# Patient Record
Sex: Male | Born: 1940 | Race: White | Hispanic: No | Smoking: Never smoker
Health system: Southern US, Community
[De-identification: ages and names within clinical notes are randomized; demographics above are authoritative.]

## PROBLEM LIST (undated history)

## (undated) DIAGNOSIS — N529 Male erectile dysfunction, unspecified: Secondary | ICD-10-CM

## (undated) DIAGNOSIS — I10 Essential (primary) hypertension: Secondary | ICD-10-CM

## (undated) DIAGNOSIS — J3489 Other specified disorders of nose and nasal sinuses: Secondary | ICD-10-CM

## (undated) DIAGNOSIS — N401 Enlarged prostate with lower urinary tract symptoms: Secondary | ICD-10-CM

## (undated) DIAGNOSIS — Z8669 Personal history of other diseases of the nervous system and sense organs: Secondary | ICD-10-CM

## (undated) DIAGNOSIS — E119 Type 2 diabetes mellitus without complications: Secondary | ICD-10-CM

## (undated) DIAGNOSIS — E785 Hyperlipidemia, unspecified: Secondary | ICD-10-CM

## (undated) DIAGNOSIS — K7581 Nonalcoholic steatohepatitis (NASH): Secondary | ICD-10-CM

## (undated) DIAGNOSIS — N183 Chronic kidney disease, stage 3 unspecified: Secondary | ICD-10-CM

## (undated) DIAGNOSIS — Z9289 Personal history of other medical treatment: Secondary | ICD-10-CM

## (undated) DIAGNOSIS — C61 Malignant neoplasm of prostate: Secondary | ICD-10-CM

## (undated) DIAGNOSIS — K7689 Other specified diseases of liver: Secondary | ICD-10-CM

## (undated) HISTORY — DX: Hyperlipidemia, unspecified: E78.5

## (undated) HISTORY — PX: CARPAL TUNNEL RELEASE: SHX101

## (undated) HISTORY — DX: Type 2 diabetes mellitus without complications: E11.9

## (undated) HISTORY — DX: Other specified diseases of liver: K76.89

## (undated) HISTORY — PX: HERNIA REPAIR: SHX51

## (undated) HISTORY — PX: OTHER SURGICAL HISTORY: SHX169

## (undated) HISTORY — PX: TONSILLECTOMY: SUR1361

## (undated) HISTORY — DX: Essential (primary) hypertension: I10

---
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
## 2002-01-14 ENCOUNTER — Encounter: Payer: Self-pay | Admitting: Emergency Medicine

## 2002-01-14 ENCOUNTER — Inpatient Hospital Stay (HOSPITAL_COMMUNITY): Admission: EM | Admit: 2002-01-14 | Discharge: 2002-01-18 | Payer: Self-pay | Admitting: Emergency Medicine

## 2002-01-15 ENCOUNTER — Encounter: Payer: Self-pay | Admitting: Internal Medicine

## 2002-01-16 ENCOUNTER — Encounter: Payer: Self-pay | Admitting: Internal Medicine

## 2002-01-17 ENCOUNTER — Encounter: Payer: Self-pay | Admitting: Internal Medicine

## 2004-03-12 ENCOUNTER — Emergency Department (HOSPITAL_COMMUNITY): Admission: EM | Admit: 2004-03-12 | Discharge: 2004-03-12 | Payer: Self-pay | Admitting: Family Medicine

## 2004-08-23 HISTORY — PX: OTHER SURGICAL HISTORY: SHX169

## 2004-09-14 ENCOUNTER — Ambulatory Visit: Payer: Self-pay | Admitting: Endocrinology

## 2004-11-23 ENCOUNTER — Emergency Department (HOSPITAL_COMMUNITY): Admission: EM | Admit: 2004-11-23 | Discharge: 2004-11-23 | Payer: Self-pay | Admitting: Family Medicine

## 2004-12-06 ENCOUNTER — Ambulatory Visit: Payer: Self-pay | Admitting: Internal Medicine

## 2005-05-30 ENCOUNTER — Ambulatory Visit: Payer: Self-pay | Admitting: Endocrinology

## 2005-07-27 ENCOUNTER — Ambulatory Visit: Payer: Self-pay | Admitting: Endocrinology

## 2005-08-08 ENCOUNTER — Ambulatory Visit: Payer: Self-pay | Admitting: Endocrinology

## 2006-08-28 ENCOUNTER — Ambulatory Visit: Payer: Self-pay | Admitting: Endocrinology

## 2006-08-28 LAB — CONVERTED CEMR LAB
ALT: 30 units/L (ref 0–40)
AST: 26 units/L (ref 0–37)
Albumin: 3.8 g/dL (ref 3.5–5.2)
Alkaline Phosphatase: 102 units/L (ref 39–117)
BUN: 13 mg/dL (ref 6–23)
Basophils Absolute: 0 10*3/uL (ref 0.0–0.1)
Basophils Relative: 0.2 % (ref 0.0–1.0)
Bilirubin, Direct: 0.2 mg/dL (ref 0.0–0.3)
CO2: 28 meq/L (ref 19–32)
Calcium: 9 mg/dL (ref 8.4–10.5)
Chloride: 103 meq/L (ref 96–112)
Chol/HDL Ratio, serum: 2.9
Cholesterol: 117 mg/dL (ref 0–200)
Creatinine, Ser: 1.1 mg/dL (ref 0.4–1.5)
Creatinine,U: 160.9 mg/dL
Eosinophil percent: 2.1 % (ref 0.0–5.0)
GFR calc non Af Amer: 72 mL/min
Glomerular Filtration Rate, Af Am: 87 mL/min/{1.73_m2}
Glucose, Bld: 126 mg/dL — ABNORMAL HIGH (ref 70–99)
HCT: 46 % (ref 39.0–52.0)
HDL: 39.8 mg/dL (ref >39.0)
Hemoglobin: 15.4 g/dL (ref 13.0–17.0)
Hgb A1c MFr Bld: 6.4 % — ABNORMAL HIGH (ref 4.6–6.0)
LDL Cholesterol: 64 mg/dL (ref 0–99)
Lymphocytes Relative: 18.9 % (ref 12.0–46.0)
MCHC: 33.6 g/dL (ref 30.0–36.0)
MCV: 93.5 fL (ref 78.0–100.0)
Microalb Creat Ratio: 1.9 mg/g (ref 0.0–30.0)
Microalb, Ur: 0.3 mg/dL (ref 0.0–1.9)
Monocytes Absolute: 0.6 10*3/uL (ref 0.2–0.7)
Monocytes Relative: 8.3 % (ref 3.0–11.0)
Neutro Abs: 5.4 10*3/uL (ref 1.4–7.7)
Neutrophils Relative %: 70.5 % (ref 43.0–77.0)
PSA: 3.98 ng/mL (ref 0.10–4.00)
Platelets: 272 10*3/uL (ref 150–400)
Potassium: 4 meq/L (ref 3.5–5.1)
RBC: 4.92 M/uL (ref 4.22–5.81)
RDW: 12.3 % (ref 11.5–14.6)
Sodium: 137 meq/L (ref 135–145)
TSH: 0.6 microintl units/mL (ref 0.35–5.50)
Total Bilirubin: 0.8 mg/dL (ref 0.3–1.2)
Total Protein: 6.9 g/dL (ref 6.0–8.3)
Triglyceride fasting, serum: 66 mg/dL (ref 0–149)
VLDL: 13 mg/dL (ref 0–40)
WBC: 7.6 10*3/uL (ref 4.5–10.5)

## 2007-07-09 ENCOUNTER — Encounter: Payer: Self-pay | Admitting: Endocrinology

## 2007-07-09 DIAGNOSIS — E1169 Type 2 diabetes mellitus with other specified complication: Secondary | ICD-10-CM | POA: Insufficient documentation

## 2007-07-09 DIAGNOSIS — I10 Essential (primary) hypertension: Secondary | ICD-10-CM

## 2007-07-09 DIAGNOSIS — E1159 Type 2 diabetes mellitus with other circulatory complications: Secondary | ICD-10-CM | POA: Insufficient documentation

## 2007-07-09 DIAGNOSIS — I152 Hypertension secondary to endocrine disorders: Secondary | ICD-10-CM | POA: Insufficient documentation

## 2007-07-09 DIAGNOSIS — E119 Type 2 diabetes mellitus without complications: Secondary | ICD-10-CM | POA: Insufficient documentation

## 2007-07-09 DIAGNOSIS — E785 Hyperlipidemia, unspecified: Secondary | ICD-10-CM

## 2007-11-12 ENCOUNTER — Ambulatory Visit: Payer: Self-pay | Admitting: Endocrinology

## 2007-11-12 DIAGNOSIS — R609 Edema, unspecified: Secondary | ICD-10-CM | POA: Insufficient documentation

## 2007-11-22 ENCOUNTER — Ambulatory Visit: Payer: Self-pay | Admitting: Internal Medicine

## 2008-11-05 DIAGNOSIS — K7581 Nonalcoholic steatohepatitis (NASH): Secondary | ICD-10-CM

## 2008-11-05 HISTORY — DX: Nonalcoholic steatohepatitis (NASH): K75.81

## 2008-11-09 ENCOUNTER — Telehealth: Payer: Self-pay | Admitting: Endocrinology

## 2008-11-15 ENCOUNTER — Ambulatory Visit: Payer: Self-pay | Admitting: Endocrinology

## 2008-11-15 DIAGNOSIS — R972 Elevated prostate specific antigen [PSA]: Secondary | ICD-10-CM | POA: Insufficient documentation

## 2008-11-15 DIAGNOSIS — M25569 Pain in unspecified knee: Secondary | ICD-10-CM | POA: Insufficient documentation

## 2008-11-18 LAB — CONVERTED CEMR LAB
ALT: 92 units/L — ABNORMAL HIGH (ref 0–53)
AST: 53 units/L — ABNORMAL HIGH (ref 0–37)
Albumin: 4 g/dL (ref 3.5–5.2)
Alkaline Phosphatase: 83 units/L (ref 39–117)
BUN: 13 mg/dL (ref 6–23)
Bacteria, UA: NEGATIVE
Basophils Absolute: 0 10*3/uL (ref 0.0–0.1)
Basophils Relative: 0.6 % (ref 0.0–3.0)
Bilirubin Urine: NEGATIVE
Bilirubin, Direct: 0.1 mg/dL (ref 0.0–0.3)
CO2: 30 meq/L (ref 19–32)
Calcium: 9.4 mg/dL (ref 8.4–10.5)
Chloride: 101 meq/L (ref 96–112)
Cholesterol: 139 mg/dL (ref 0–200)
Creatinine, Ser: 1 mg/dL (ref 0.4–1.5)
Creatinine,U: 152.3 mg/dL
Crystals: NEGATIVE
Eosinophils Absolute: 0.2 10*3/uL (ref 0.0–0.7)
Eosinophils Relative: 2.3 % (ref 0.0–5.0)
GFR calc Af Amer: 96 mL/min
GFR calc non Af Amer: 79 mL/min
Glucose, Bld: 97 mg/dL (ref 70–99)
HCT: 46.6 % (ref 39.0–52.0)
HDL: 47.2 mg/dL (ref >39.0)
Hemoglobin, Urine: NEGATIVE
Hemoglobin: 16 g/dL (ref 13.0–17.0)
Hgb A1c MFr Bld: 6.7 % — ABNORMAL HIGH (ref 4.6–6.0)
Ketones, ur: NEGATIVE mg/dL
LDL Cholesterol: 74 mg/dL (ref 0–99)
Lymphocytes Relative: 24 % (ref 12.0–46.0)
MCHC: 34.3 g/dL (ref 30.0–36.0)
MCV: 93.2 fL (ref 78.0–100.0)
Microalb Creat Ratio: 2.6 mg/g (ref 0.0–30.0)
Microalb, Ur: 0.4 mg/dL (ref 0.0–1.9)
Monocytes Absolute: 0.7 10*3/uL (ref 0.1–1.0)
Monocytes Relative: 9.1 % (ref 3.0–12.0)
Neutro Abs: 5 10*3/uL (ref 1.4–7.7)
Neutrophils Relative %: 64 % (ref 43.0–77.0)
Nitrite: NEGATIVE
PSA: 4.68 ng/mL — ABNORMAL HIGH (ref 0.10–4.00)
Platelets: 231 10*3/uL (ref 150–400)
Potassium: 4 meq/L (ref 3.5–5.1)
RBC: 5 M/uL (ref 4.22–5.81)
RDW: 12.7 % (ref 11.5–14.6)
Sed Rate: 8 mm/hr (ref 0–16)
Sodium: 138 meq/L (ref 135–145)
Specific Gravity, Urine: 1.025 (ref 1.000–1.03)
TSH: 1.04 microintl units/mL (ref 0.35–5.50)
Total Bilirubin: 0.8 mg/dL (ref 0.3–1.2)
Total CHOL/HDL Ratio: 2.9
Total Protein, Urine: NEGATIVE mg/dL
Total Protein: 6.9 g/dL (ref 6.0–8.3)
Triglycerides: 91 mg/dL (ref 0–149)
Uric Acid, Serum: 6.5 mg/dL (ref 4.0–7.8)
Urine Glucose: NEGATIVE mg/dL
Urobilinogen, UA: 0.2 (ref 0.0–1.0)
VLDL: 18 mg/dL (ref 0–40)
WBC: 7.8 10*3/uL (ref 4.5–10.5)
pH: 5.5 (ref 5.0–8.0)

## 2008-11-24 ENCOUNTER — Ambulatory Visit: Payer: Self-pay | Admitting: Endocrinology

## 2008-11-24 LAB — CONVERTED CEMR LAB: HCV Ab: NEGATIVE

## 2008-11-26 LAB — CONVERTED CEMR LAB
ALT: 77 units/L — ABNORMAL HIGH (ref 0–53)
AST: 48 units/L — ABNORMAL HIGH (ref 0–37)
Albumin: 3.6 g/dL (ref 3.5–5.2)
Alkaline Phosphatase: 85 units/L (ref 39–117)
Bilirubin, Direct: 0.2 mg/dL (ref 0.0–0.3)
Total Bilirubin: 1.1 mg/dL (ref 0.3–1.2)
Total Protein: 6.5 g/dL (ref 6.0–8.3)

## 2008-12-08 ENCOUNTER — Telehealth: Payer: Self-pay | Admitting: Endocrinology

## 2009-01-10 ENCOUNTER — Ambulatory Visit: Payer: Self-pay | Admitting: Endocrinology

## 2009-01-12 DIAGNOSIS — K7581 Nonalcoholic steatohepatitis (NASH): Secondary | ICD-10-CM | POA: Insufficient documentation

## 2009-01-12 LAB — CONVERTED CEMR LAB
ALT: 71 units/L — ABNORMAL HIGH (ref 0–53)
AST: 36 units/L (ref 0–37)
Albumin: 3.5 g/dL (ref 3.5–5.2)
Alkaline Phosphatase: 83 units/L (ref 39–117)

## 2009-12-05 ENCOUNTER — Telehealth (INDEPENDENT_AMBULATORY_CARE_PROVIDER_SITE_OTHER): Payer: Self-pay | Admitting: *Deleted

## 2009-12-05 ENCOUNTER — Ambulatory Visit: Payer: Self-pay | Admitting: Endocrinology

## 2009-12-12 ENCOUNTER — Telehealth: Payer: Self-pay | Admitting: Endocrinology

## 2009-12-12 ENCOUNTER — Ambulatory Visit: Payer: Self-pay | Admitting: Endocrinology

## 2010-03-21 ENCOUNTER — Ambulatory Visit: Payer: Self-pay | Admitting: Endocrinology

## 2010-03-21 DIAGNOSIS — N4889 Other specified disorders of penis: Secondary | ICD-10-CM | POA: Insufficient documentation

## 2010-03-21 LAB — CONVERTED CEMR LAB
AST: 24 units/L (ref 0–37)
Hemoglobin, Urine: NEGATIVE
Nitrite: NEGATIVE
Total Protein, Urine: NEGATIVE mg/dL
Urine Glucose: NEGATIVE mg/dL
pH: 5 (ref 5.0–8.0)

## 2010-12-03 LAB — CONVERTED CEMR LAB
ALT: 102 units/L — ABNORMAL HIGH (ref 0–53)
ALT: 32 units/L (ref 0–53)
AST: 23 units/L (ref 0–37)
AST: 58 units/L — ABNORMAL HIGH (ref 0–37)
Albumin: 3.7 g/dL (ref 3.5–5.2)
Albumin: 3.9 g/dL (ref 3.5–5.2)
Alkaline Phosphatase: 93 units/L (ref 39–117)
BUN: 16 mg/dL (ref 6–23)
Bacteria, UA: NEGATIVE
Basophils Absolute: 0 10*3/uL (ref 0.0–0.1)
Basophils Relative: 0.2 % (ref 0.0–1.0)
Basophils Relative: 0.6 % (ref 0.0–3.0)
Bilirubin Urine: NEGATIVE
Bilirubin, Direct: 0.2 mg/dL (ref 0.0–0.3)
CO2: 30 meq/L (ref 19–32)
Calcium: 9.1 mg/dL (ref 8.4–10.5)
Chloride: 102 meq/L (ref 96–112)
Chloride: 103 meq/L (ref 96–112)
Cholesterol: 112 mg/dL (ref 0–200)
Cholesterol: 122 mg/dL (ref 0–200)
Creatinine, Ser: 1.1 mg/dL (ref 0.4–1.5)
Crystals: NEGATIVE
Eosinophils Absolute: 0.1 10*3/uL (ref 0.0–0.6)
Eosinophils Relative: 1.6 % (ref 0.0–5.0)
Eosinophils Relative: 5 % (ref 0.0–5.0)
GFR calc Af Amer: 86 mL/min
GFR calc non Af Amer: 64.06 mL/min (ref >60)
GFR calc non Af Amer: 71 mL/min
Glucose, Bld: 114 mg/dL — ABNORMAL HIGH (ref 70–99)
HCT: 45.6 % (ref 39.0–52.0)
HCT: 46.7 % (ref 39.0–52.0)
HDL: 47.3 mg/dL (ref >39.0)
Hemoglobin: 15.4 g/dL (ref 13.0–17.0)
Hemoglobin: 16 g/dL (ref 13.0–17.0)
Hgb A1c MFr Bld: 6.3 % — ABNORMAL HIGH (ref 4.6–6.0)
Ketones, ur: NEGATIVE mg/dL
LDL Cholesterol: 57 mg/dL (ref 0–99)
LDL Cholesterol: 62 mg/dL (ref 0–99)
Leukocytes, UA: NEGATIVE
Lymphocytes Relative: 20.1 % (ref 12.0–46.0)
Lymphs Abs: 1.7 10*3/uL (ref 0.7–4.0)
MCHC: 34.2 g/dL (ref 30.0–36.0)
MCV: 92.6 fL (ref 78.0–100.0)
MCV: 94.5 fL (ref 78.0–100.0)
Microalb Creat Ratio: 3 mg/g (ref 0.0–30.0)
Monocytes Absolute: 0.6 10*3/uL (ref 0.2–0.7)
Monocytes Absolute: 0.8 10*3/uL (ref 0.1–1.0)
Monocytes Relative: 12.1 % — ABNORMAL HIGH (ref 3.0–12.0)
Monocytes Relative: 9.2 % (ref 3.0–11.0)
Mucus, UA: NEGATIVE
Neutro Abs: 4 10*3/uL (ref 1.4–7.7)
Neutro Abs: 4.2 10*3/uL (ref 1.4–7.7)
Neutrophils Relative %: 68.9 % (ref 43.0–77.0)
Nitrite: NEGATIVE
PSA: 2.86 ng/mL (ref 0.10–4.00)
Platelets: 197 10*3/uL (ref 150–400)
Potassium: 3.9 meq/L (ref 3.5–5.1)
Potassium: 4.4 meq/L (ref 3.5–5.1)
RBC: 5.04 M/uL (ref 4.22–5.81)
RDW: 12.4 % (ref 11.5–14.6)
Sodium: 139 meq/L (ref 135–145)
Sodium: 140 meq/L (ref 135–145)
Specific Gravity, Urine: 1.025 (ref 1.000–1.03)
Specific Gravity, Urine: >=1.03 (ref 1.000–1.030)
Squamous Epithelial / HPF: NEGATIVE /lpf
TSH: 0.93 microintl units/mL (ref 0.35–5.50)
TSH: 1.05 microintl units/mL (ref 0.35–5.50)
Total Bilirubin: 1.2 mg/dL (ref 0.3–1.2)
Total CHOL/HDL Ratio: 2.4
Total Protein, Urine: NEGATIVE mg/dL
Total Protein, Urine: NEGATIVE mg/dL
Total Protein: 6.8 g/dL (ref 6.0–8.3)
Total Protein: 6.9 g/dL (ref 6.0–8.3)
Triglycerides: 38 mg/dL (ref 0–149)
Urine Glucose: NEGATIVE mg/dL
Urine Glucose: NEGATIVE mg/dL
Urobilinogen, UA: 0.2 (ref 0.0–1.0)
Urobilinogen, UA: 0.2 (ref 0.0–1.0)
VLDL: 11.2 mg/dL (ref 0.0–40.0)
VLDL: 8 mg/dL (ref 0–40)
WBC: 6.1 10*3/uL (ref 4.5–10.5)
WBC: 6.8 10*3/uL (ref 4.5–10.5)
pH: 5 (ref 5.0–8.0)
pH: 6 (ref 5.0–8.0)

## 2010-12-07 NOTE — Assessment & Plan Note (Signed)
Summary: FU  STC   Vital Signs:  Patient profile:   70 year old male Height:      69 inches (175.26 cm) Weight:      212.13 pounds (96.42 kg) O2 Sat:      97 % on Room air Temp:     97.9 degrees F (36.61 degrees C) oral Pulse rate:   70 / minute BP sitting:   130 / 70  (left arm) Cuff size:   large  Vitals Entered By: Gardenia Phlegm RMA (Mar 21, 2010 8:06 AM)  O2 Flow:  Room air CC: Follow-up visit/ pt states he is no longer taking Glucophage/ CF Is Patient Diabetic? Yes   CC:  Follow-up visit/ pt states he is no longer taking Glucophage/ CF.  History of Present Illness: pt states 1 week of slight pain at the penis--not just in the context of urination.  no associated d/c. pt takes actos but not metformin.  Current Medications (verified): 1)  Adult Aspirin Low Strength 81 Mg  Tbdp (Aspirin) .... Take 1 By Mouth Qd 2)  Vasotec 20 Mg  Tabs (Enalapril Maleate) .... Take 1 By Mouth Two Times A Day 3)  Lipitor 80 Mg  Tabs (Atorvastatin Calcium) .... Take 1 By Mouth Once Daily 4)  Glucophage Xr 500 Mg  Tb24 (Metformin Hcl) .... Take 2 Tabs Each Am 5)  Actos 45 Mg Tabs (Pioglitazone Hcl) .Marland Kitchen.. 1 Qd  Allergies (verified): 1)  ! Mevacor 2)  ! Zocor  Past History:  Past Medical History: Last updated: 07/09/2007 Diabetes mellitus, type II Hyperlipidemia Hypertension  Review of Systems  The patient denies fever and dyspnea on exertion.    Physical Exam  General:  obese.  no distress  Genitalia:  Normal external male genitalia with no urethral discharge.  Extremities:  trace bilat edema Additional Exam:  Total Bilirubin           0.7 mg/dL                   0.3-1.2   Direct Bilirubin          0.1 mg/dL                   0.0-0.3   Alkaline Phosphatase      71 U/L                      39-117   AST                       24 U/L                      0-37   ALT                       31 U/L                      0-53   Total Protein             6.5 g/dL                     6.0-8.3   Albumin                   3.7 g/dL                    3.5-5.2  Hemoglobin A1C            6.2 %                Impression & Recommendations:  Problem # 1:  DIABETES MELLITUS, TYPE II (ICD-250.00) well-controlled  Problem # 2:  FATTY LIVER DISEASE (ICD-571.8) Assessment: Improved  Problem # 3:  PENILE PAIN (EXP-973.31) uncertain etiology  Other Orders: TLB-Hepatic/Liver Function Pnl (80076-HEPATIC) TLB-A1C / Hgb A1C (Glycohemoglobin) (83036-A1C) TLB-Udip w/ Micro (81001-URINE) Est. Patient Level IV (25087)  Patient Instructions: 1)  blood and urine tests today. 2)  tests are being ordered for you today.  a few days after the test(s), please call [phone] to hear your test results. 3)  pending the test results, please continue the same medications for now, except you can stay-off metformin. 4)  return 4 months 5)  call next week if the pain persists, and i'll prescribe medication for you on a trial basis 6)  (update: i left message on phone-tree:  rx as we discussed)

## 2010-12-07 NOTE — Progress Notes (Signed)
Summary: Actos alternative  Phone Note Call from Patient   Summary of Call: Patient came today for BP check, which was good (130/70) and made me aware that he did receive his lab results. He is currently on Metformin, but would like to know if there is an alternative to Actos that you recommend? He is quite nervous about taking Actos like MD mentioned on phone tree due to all the bad reviews it has been getting. Please advise. Initial call taken by: Ernestene Mention,  December 12, 2009 11:00 AM  Follow-up for Phone Call        actos is a very safe medication.  it is avandia that is dangerous--i have never prescribed it. Follow-up by: Donavan Foil MD,  December 12, 2009 12:41 PM  Additional Follow-up for Phone Call Additional follow up Details #1::        Patient notified and would like to know if he should finish the Metformin he has (which is a large amount) or go ahead and begin Actos? If you would like for the patient to begin Actos he will need 90 day prescription.  Please advise. Additional Follow-up by: Ernestene Mention,  December 12, 2009 1:47 PM    Additional Follow-up for Phone Call Additional follow up Details #2::    i sent rx for actos.  it takes a few months to have its full effect, so i would take both until we get back together is 3 months or so. Follow-up by: Donavan Foil MD,  December 12, 2009 3:32 PM  Additional Follow-up for Phone Call Additional follow up Details #3:: Details for Additional Follow-up Action Taken: Patient notified. Additional Follow-up by: Ernestene Mention,  December 12, 2009 3:33 PM  New/Updated Medications: ACTOS 45 MG TABS (PIOGLITAZONE HCL) 1 qd Prescriptions: ACTOS 45 MG TABS (PIOGLITAZONE HCL) 1 qd  #30 x 11   Entered and Authorized by:   Donavan Foil MD   Signed by:   Donavan Foil MD on 12/12/2009   Method used:   [facility]        [facility]  [address]       [address]       [place],   [address]  Ph: [phone] or [phone]       Fax: [phone]   RxID:   [id number]

## 2010-12-07 NOTE — Assessment & Plan Note (Signed)
Summary: FU ON MEDS /NWS   Vital Signs:  Patient profile:   70 year old male Height:      69 inches (175.26 cm) Weight:      205.13 pounds (93.24 kg) BMI:     30.40 O2 Sat:      95 % on Room air Temp:     97.3 degrees F (36.28 degrees C) oral Pulse rate:   87 / minute BP sitting:   142 / 80  (left arm) Cuff size:   large  Vitals Entered By: Gardenia Phlegm CMA (December 05, 2009 8:03 AM)  O2 Flow:  Room air CC: Follow-up visit/ pt needs refills on meds/ CF Is Patient Diabetic? Yes   CC:  Follow-up visit/ pt needs refills on meds/ CF.  History of Present Illness: here for regular wellness examination.  He's feeling pretty well in general, and does not drink or smoke.   Current Medications (verified): 1)  Adult Aspirin Low Strength 81 Mg  Tbdp (Aspirin) .... Take 1 By Mouth Qd 2)  Vasotec 20 Mg  Tabs (Enalapril Maleate) .... Take 1 By Mouth Two Times A Day Once Daily Physical Is Due No Addtional Refills Until Appt 3)  Lipitor 80 Mg  Tabs (Atorvastatin Calcium) .... Take 1 By Mouth Once Daily 4)  Glucophage Xr 500 Mg  Tb24 (Metformin Hcl) .... Take 2 Q Am Physical Is Due Last Ov 11/11/2007  Allergies (verified): 1)  ! Mevacor 2)  ! Zocor  Past History:  Past Medical History: Last updated: 07/09/2007 Diabetes mellitus, type II Hyperlipidemia Hypertension  Family History: Reviewed history from 11/12/2007 and no changes required. no cancer  Social History: Reviewed history from 11/22/2007 and no changes required. retired married Never Smoked  Review of Systems  The patient denies fever, weight loss, weight gain, vision loss, decreased hearing, chest pain, syncope, prolonged cough, headaches, abdominal pain, melena, hematochezia, severe indigestion/heartburn, hematuria, suspicious skin lesions, and depression.         denies decreased urinary stream  Physical Exam  General:  obese.  no distress  Head:  head: no deformity eyes: no periorbital swelling, no  proptosis external nose and ears are normal mouth: no lesion seen Neck:  Supple without thyroid enlargement or tenderness. No cervical lymphadenopathy, neck masses or tracheal deviation.  Heart:  Regular rate and rhythm without murmurs or gallops noted. Normal S1,S2.   Abdomen:  abdomen is soft, nontender.  no hepatosplenomegaly.   not distended.  no hernia  Rectal:  normal external and internal exam.  heme neg  Prostate:  Normal size prostate without masses or tenderness.  Msk:  muscle bulk and strength are grossly normal.  no obvious joint swelling.  gait is normal and steady  Extremities:  no deformity.  no ulcer on the feet.  feet are of normal color and temp.  there are mild bilateral varicosities. trace right pedal edema and trace left pedal edema.  left great toe (recent nail removal), has no drainage, swelling, or erythema). Neurologic:  cn 2-12 grossly intact.   readily moves all 4's.   sensation is intact to touch on the feet  Skin:  normal texture and temp.  no rash.  not diaphoretic  Cervical Nodes:  No significant adenopathy.  Psych:  Alert and cooperative; normal mood and affect; normal attention span and concentration.   Additional Exam:  SEPARATE EVALUATION FOLLOWS--EACH PROBLEM HERE IS NEW, NOT RESPONDING TO TREATMENT, OR POSES SIGNIFICANT RISK TO THE PATIENT'S HEALTH: HISTORY OF THE PRESENT  ILLNESS: elevated tranaaminases are again noted today he takes vasotec as rx'ed PAST MEDICAL HISTORY reviewed and up to date today REVIEW OF SYSTEMS: denies doe PHYSICAL EXAMINATION: see vs page dorsalis pedis intact bilat.  no carotid bruit clear to auscultation.  no respiratory distress LAB/XRAY RESULTS: AST                  [H]  58 U/L                      0-37 ALT                  [H]  102 U/L   IMPRESSION: nash htn, needs increased rx PLAN: please consider actos bp check < 1 month   Impression & Recommendations:  Problem # 1:  ROUTINE GENERAL MEDICAL EXAM@HEALTH   CARE FACL (ICD-V70.0)  Medications Added to Medication List This Visit: 1)  Vasotec 20 Mg Tabs (Enalapril maleate) .... Take 1 by mouth two times a day 2)  Lipitor 80 Mg Tabs (Atorvastatin calcium) .... Take 1 by mouth once daily 3)  Glucophage Xr 500 Mg Tb24 (Metformin hcl) .... Take 2 tabs each am  Other Orders: EKG w/ Interpretation (93000) TLB-Lipid Panel (80061-LIPID) TLB-BMP (Basic Metabolic Panel-BMET) (70350-KXFGHWE) TLB-CBC Platelet - w/Differential (85025-CBCD) TLB-Hepatic/Liver Function Pnl (80076-HEPATIC) TLB-TSH (Thyroid Stimulating Hormone) (84443-TSH) TLB-A1C / Hgb A1C (Glycohemoglobin) (83036-A1C) TLB-Microalbumin/Creat Ratio, Urine (82043-MALB) TLB-PSA (Prostate Specific Antigen) (84153-PSA) TLB-Udip w/ Micro (81001-URINE) Est. Patient Level III (99371) Est. Patient 65& > (69678)   Patient Instructions: 1)  tests are being ordered for you today.  a few days after the test(s), please call [phone] to hear your test results. 2)  please consider colonoscopy, it is can prevent you from dying from cancer. 3)  blood pressure check here within 1 month 4)  we discussed the recommendations of the preventive services task force Prescriptions: GLUCOPHAGE XR 500 MG  TB24 (METFORMIN HCL) TAKE 2 tabs each am  #60 x 11   Entered and Authorized by:   Donavan Foil MD   Signed by:   Donavan Foil MD on 12/05/2009   Method used:   Electronically to        [id number] (retail)       [place], [place]  [address]       Ph: [phone]       Fax: [phone]   RxID:   [id number] LIPITOR 80 MG  TABS (ATORVASTATIN CALCIUM) take 1 by mouth once daily  #30 x 11   Entered and Authorized by:   Donavan Foil MD   Signed by:   Donavan Foil MD on 12/05/2009   Method used:   Electronically to        [id number] (retail)       [place], [place]  [address]       Ph: [phone]       Fax: [phone]    RxID:   [id number] VASOTEC 20 MG  TABS (ENALAPRIL MALEATE) take 1 by mouth two times a day  #60 x 11   Entered and Authorized by:   Donavan Foil MD   Signed by:   Donavan Foil MD on 12/05/2009   Method used:   Electronically to        [id number]* (retail)  [place], [place]  [address]       Ph: [phone]       Fax: [phone]   RxID:   [id number]

## 2010-12-07 NOTE — Progress Notes (Signed)
Summary: Paper chart  ---- Converted from flag ---- ---- 12/05/2009 8:23 AM, Donavan Foil MD wrote: paper chart please ? myoview ------------------------------  Phone Note Other Incoming   Summary of Call: Paper chart requested Initial call taken by: Gardenia Phlegm CMA,  December 05, 2009 8:29 AM  Follow-up for Phone Call        Chart is on MD's desk Follow-up by: Gardenia Phlegm CMA,  December 05, 2009 9:19 AM     Appended Document: Paper chart 08/22/04- Brantley Fling

## 2010-12-07 NOTE — Assessment & Plan Note (Signed)
Summary: WALK IN, SAE TOLD TO COME FOR BP CHECK PER PT/CD   Nurse Visit   Vital Signs:  Patient profile:   71 year old male BP sitting:   130 / 70  (left arm)  Vitals Entered By: Ernestene Mention (December 12, 2009 10:51 AM) Comments Patient came today for BP check and it was 130/70, he denies any symptoms. Patient was allowed to return home without needing to see MD. Ernestene Mention  December 12, 2009 10:52 AM     Allergies: 1)  ! Mevacor 2)  ! Zocor  Orders Added: 1)  Est. Patient Level I [17356]

## 2011-01-10 ENCOUNTER — Other Ambulatory Visit: Payer: Medicare Other

## 2011-01-10 ENCOUNTER — Other Ambulatory Visit: Payer: Self-pay | Admitting: Endocrinology

## 2011-01-10 ENCOUNTER — Ambulatory Visit (INDEPENDENT_AMBULATORY_CARE_PROVIDER_SITE_OTHER): Payer: Medicare Other | Admitting: Endocrinology

## 2011-01-10 ENCOUNTER — Encounter: Payer: Self-pay | Admitting: Endocrinology

## 2011-01-10 DIAGNOSIS — Z0000 Encounter for general adult medical examination without abnormal findings: Secondary | ICD-10-CM

## 2011-01-10 DIAGNOSIS — E119 Type 2 diabetes mellitus without complications: Secondary | ICD-10-CM

## 2011-01-10 DIAGNOSIS — I10 Essential (primary) hypertension: Secondary | ICD-10-CM

## 2011-01-10 DIAGNOSIS — R05 Cough: Secondary | ICD-10-CM | POA: Insufficient documentation

## 2011-01-10 DIAGNOSIS — R972 Elevated prostate specific antigen [PSA]: Secondary | ICD-10-CM

## 2011-01-10 DIAGNOSIS — R059 Cough, unspecified: Secondary | ICD-10-CM | POA: Insufficient documentation

## 2011-01-10 DIAGNOSIS — E785 Hyperlipidemia, unspecified: Secondary | ICD-10-CM

## 2011-01-10 DIAGNOSIS — K7689 Other specified diseases of liver: Secondary | ICD-10-CM

## 2011-01-10 LAB — URINALYSIS, ROUTINE W REFLEX MICROSCOPIC
Bilirubin Urine: NEGATIVE
Hgb urine dipstick: NEGATIVE
Leukocytes, UA: NEGATIVE
Nitrite: NEGATIVE
pH: 5.5 (ref 5.0–8.0)

## 2011-01-10 LAB — CBC WITH DIFFERENTIAL/PLATELET
Basophils Relative: 0.5 % (ref 0.0–3.0)
Eosinophils Absolute: 0.1 10*3/uL (ref 0.0–0.7)
Hemoglobin: 15 g/dL (ref 13.0–17.0)
Lymphocytes Relative: 24 % (ref 12.0–46.0)
MCHC: 34 g/dL (ref 30.0–36.0)
Monocytes Relative: 10.7 % (ref 3.0–12.0)
Neutro Abs: 4 10*3/uL (ref 1.4–7.7)
Neutrophils Relative %: 62.7 % (ref 43.0–77.0)
RBC: 4.6 Mil/uL (ref 4.22–5.81)
WBC: 6.3 10*3/uL (ref 4.5–10.5)

## 2011-01-10 LAB — LIPID PANEL
Cholesterol: 105 mg/dL (ref 0–200)
HDL: 45.4 mg/dL (ref >39.00)
Total CHOL/HDL Ratio: 2
Triglycerides: 55 mg/dL (ref 0.0–149.0)

## 2011-01-10 LAB — HEPATIC FUNCTION PANEL
AST: 23 U/L (ref 0–37)
Albumin: 3.8 g/dL (ref 3.5–5.2)
Alkaline Phosphatase: 82 U/L (ref 39–117)
Bilirubin, Direct: 0.2 mg/dL (ref 0.0–0.3)
Total Protein: 6.7 g/dL (ref 6.0–8.3)

## 2011-01-10 LAB — BASIC METABOLIC PANEL
BUN: 21 mg/dL (ref 6–23)
CO2: 25 mEq/L (ref 19–32)
Calcium: 9.2 mg/dL (ref 8.4–10.5)
Chloride: 107 mEq/L (ref 96–112)
Creatinine, Ser: 1.2 mg/dL (ref 0.4–1.5)
Glucose, Bld: 104 mg/dL — ABNORMAL HIGH (ref 70–99)

## 2011-01-10 LAB — MICROALBUMIN / CREATININE URINE RATIO
Creatinine,U: 357.4 mg/dL
Microalb, Ur: 1.1 mg/dL (ref 0.0–1.9)

## 2011-01-10 LAB — PSA: PSA: 4.87 ng/mL — ABNORMAL HIGH (ref 0.10–4.00)

## 2011-01-16 NOTE — Assessment & Plan Note (Signed)
Summary: REFILLS ON MEDS /NWS   Vital Signs:  Patient profile:   70 year old male Height:      69 inches (175.26 cm) Weight:      217.50 pounds (98.86 kg) BMI:     32.24 O2 Sat:      96 % on Room air Temp:     98.4 degrees F (36.89 degrees C) oral Pulse rate:   96 / minute Pulse rhythm:   regular BP sitting:   118 / 86  (left arm) Cuff size:   large  Vitals Entered By: Rebeca Alert CMA Deborra Medina) (January 10, 2011 8:04 AM)  O2 Flow:  Room air CC: Follow-up visit/refill on meds/aj Is Patient Diabetic? Yes   CC:  Follow-up visit/refill on meds/aj.  History of Present Illness: pt states few years of slight cough in the throat, but no assoc sob.  he feels there may be some contribution of rhinorrhea as well.  Current Medications (verified): 1)  Adult Aspirin Low Strength 81 Mg  Tbdp (Aspirin) .... Take 1 By Mouth Qd 2)  Vasotec 20 Mg  Tabs (Enalapril Maleate) .... Take 1 By Mouth Two Times A Day 3)  Lipitor 80 Mg  Tabs (Atorvastatin Calcium) .... Take 1 By Mouth Once Daily 4)  Actos 45 Mg Tabs (Pioglitazone Hcl) .Marland Kitchen.. 1 Qd  Allergies (verified): 1)  ! Mevacor 2)  ! Zocor  Past History:  Past Medical History: Last updated: 07/09/2007 Diabetes mellitus, type II Hyperlipidemia Hypertension  Social History: Reviewed history from 11/22/2007 and no changes required. retired married Never Smoked  Review of Systems  The patient denies weight loss and weight gain.    Physical Exam  Head:  head: no deformity eyes: no periorbital swelling, no proptosis external nose and ears are normal mouth: no lesion seen Ears:  TM's intact and clear with normal canals with grossly normal hearing.   Additional Exam:  Hemoglobin A1C            6.5 %    Impression & Recommendations:  Problem # 1:  COUGH DUE TO ACE INHIBITORS (ICD-786.2) Assessment New  Problem # 2:  allergic rhinitis new  Problem # 3:  DIABETES MELLITUS, TYPE II (ICD-250.00) well-controlled  Medications Added  to Medication List This Visit: 1)  Losartan Potassium 100 Mg Tabs (Losartan potassium) .Marland Kitchen.. 1 tab once daily 2)  Flonase 50 Mcg/act Susp (Fluticasone propionate) .... 2 sprays each side once daily  Other Orders: TLB-Lipid Panel (80061-LIPID) TLB-BMP (Basic Metabolic Panel-BMET) (59163-WGYKZLD) TLB-CBC Platelet - w/Differential (85025-CBCD) TLB-Hepatic/Liver Function Pnl (80076-HEPATIC) TLB-TSH (Thyroid Stimulating Hormone) (84443-TSH) TLB-A1C / Hgb A1C (Glycohemoglobin) (83036-A1C) TLB-Microalbumin/Creat Ratio, Urine (82043-MALB) TLB-PSA (Prostate Specific Antigen) (84153-PSA) TLB-Udip w/ Micro (81001-URINE) Est. Patient Level IV (35701)  Patient Instructions: 1)  tests are being ordered for you today.  a few days after the test(s), please call [phone] to hear your test results. 2)  pending the test results, please change enalapril to losartan 100 mg once daily. 3)  take fluticasone nasal spray as below.   4)  please schedule a regular physical soon.   5)  (update: i left message on phone-tree:  rx as we discussed) Prescriptions: FLONASE 50 MCG/ACT SUSP (FLUTICASONE PROPIONATE) 2 sprays each side once daily  #1 device x 11   Entered and Authorized by:   Donavan Foil MD   Signed by:   Donavan Foil MD on 01/10/2011   Method used:   [facility]  [facility]  [address] [facility]retail[facility]       [place], [place]  [address]       Ph: [phone] or [phone]       Fax: [phone]   RxID:   [id number] LOSARTAN POTASSIUM 100 MG TABS (LOSARTAN POTASSIUM) 1 tab once daily  #30 x 11   Entered and Authorized by:   Donavan Foil MD   Signed by:   Donavan Foil MD on 01/10/2011   Method used:   [facility]        [facility]  [address] [id number]       [place], [place]  [address]       Ph: [phone] or [phone]       Fax: [phone]   RxID:   [id number]    Orders Added: 1)  TLB-Lipid Panel  [80061-LIPID] 2)  TLB-BMP (Basic Metabolic Panel-BMET) [50388-EKCMKLK] 3)  TLB-CBC Platelet - w/Differential [85025-CBCD] 4)  TLB-Hepatic/Liver Function Pnl [80076-HEPATIC] 5)  TLB-TSH (Thyroid Stimulating Hormone) [84443-TSH] 6)  TLB-A1C / Hgb A1C (Glycohemoglobin) [83036-A1C] 7)  TLB-Microalbumin/Creat Ratio, Urine [82043-MALB] 8)  TLB-PSA (Prostate Specific Antigen) [84153-PSA] 9)  TLB-Udip w/ Micro [81001-URINE] 10)  Est. Patient Level IV [91791]

## 2011-02-19 ENCOUNTER — Encounter: Payer: Self-pay | Admitting: Endocrinology

## 2011-02-19 ENCOUNTER — Ambulatory Visit (INDEPENDENT_AMBULATORY_CARE_PROVIDER_SITE_OTHER): Payer: Medicare Other | Admitting: Endocrinology

## 2011-02-19 VITALS — BP 120/70 | HR 72 | Temp 97.8°F | Ht 68.0 in | Wt 219.8 lb

## 2011-02-19 DIAGNOSIS — Z0000 Encounter for general adult medical examination without abnormal findings: Secondary | ICD-10-CM

## 2011-02-19 DIAGNOSIS — Z136 Encounter for screening for cardiovascular disorders: Secondary | ICD-10-CM

## 2011-02-19 DIAGNOSIS — E119 Type 2 diabetes mellitus without complications: Secondary | ICD-10-CM

## 2011-02-19 NOTE — Patient Instructions (Addendum)
please consider these measures for your health:  minimize alcohol.  do not use tobacco products.  have a colonoscopy, an this can reduce your risk of dying from cancer.  keep firearms safely stored.  always use seat belts.  have working smoke alarms in your home.  see an eye doctor and dentist regularly.  never drive under the influence of alcohol or drugs (including prescription drugs).  those with fair skin should take precautions against the sun. please let me know what your wishes would be, if artificial life support measures should become necessary.  it is critically important to prevent falling down (keep floor areas well-lit, dry, and free of loose objects). (update:  we discussed code status.  pt requests full code, but would not want to be started or maintained on artificial life-support measures if there was not a reasonable chance of recovery).

## 2011-02-19 NOTE — Progress Notes (Signed)
  Subjective:    Patient ID: [name], male    DOB: 1940/12/20, 70 y.o.   MRN: [id number]  HPI here for regular wellness examination.  He's feeling pretty well in general, and says chronic med probs are stable, except as noted below  Past Medical History  Diagnosis Date  . DIABETES MELLITUS, TYPE II 07/09/2007  . HYPERLIPIDEMIA 07/09/2007  . HYPERTENSION 07/09/2007  . FATTY LIVER DISEASE 01/12/2009   Past Surgical History  Procedure Date  . Stress cardiolite 08/23/2004    reports that he has never smoked. He does not have any smokeless tobacco history on file. His alcohol and drug histories not on file. family history is negative for Cancer. Married.  retired Allergies  Allergen Reactions  . Lovastatin     REACTION: Foot pain  . Simvastatin     REACTION: Myalgias     Review of Systems  Constitutional: Negative for fever.  HENT: Negative for hearing loss.   Eyes: Negative for visual disturbance.  Respiratory: Negative for cough and shortness of breath.   Cardiovascular: Negative for chest pain.  Gastrointestinal: Negative for blood in stool.  Genitourinary: Negative for hematuria.  Musculoskeletal: Negative for back pain.  Skin: Negative for rash.  Neurological: Negative for syncope and headaches.  Hematological: Does not bruise/bleed easily.  Psychiatric/Behavioral: Negative for dysphoric mood. The patient is not nervous/anxious.        Objective:   Physical Exam VS: see vs page GEN: no distress.  Obese. HEAD: head: no deformity eyes: no periorbital swelling, no proptosis external nose and ears are normal mouth: no lesion seen NECK: supple, thyroid is not enlarged CHEST WALL: no deformity BREASTS:  No gynecomastia CV: reg rate and rhythm, no murmur ABD: abdomen is soft, nontender.  no hepatosplenomegaly.  not distended.  no hernia RECTAL: normal external and internal exam.  heme neg. PROSTATE:  Normal size.  No nodule MUSCULOSKELETAL: muscle bulk and strength  are grossly normal.  no obvious joint swelling.  gait is normal and steady EXTEMITIES: no deformity.  no ulcer on the feet.  feet are of normal color and temp.  There is 1+ bilat leg edema, and bilat varicosities. There is onychomycosis of the right great toenail.  The left great toenail is surgically absent PULSES: dorsalis pedis intact bilat.  no carotid bruit NEURO:  cn 2-12 grossly intact.   readily moves all 4's.  sensation is intact to touch on the feet SKIN:  Normal texture and temperature.  No rash or suspicious lesion is visible.   NODES:  None palpable at the neck PSYCH: alert, oriented x3.  Does not appear anxious nor depressed.  Ecg:  No signif change        Assessment & Plan:  Wellness visit, with prob stable

## 2011-03-23 NOTE — Discharge Summary (Signed)
[facility]  Patient:    [name], [name] Visit Number: [id number] MRN: [id number]          Service Type: MED Location: [id number] Attending Physician:  Linna Darner Dictated by:   Jairo Ben, M.D. Admit Date:  01/13/2002 Discharge Date: 01/18/2002   CC:         Dr. Loanne Drilling, [facility], [place]   Discharge Summary  ADDENDUM:  CT scan: Minimal amount of pleural effusions and some minimal atelectasis at the right base most likely secondary to IV hydration.  I expect this to improve given time.  I would recommend chest x-ray for followup, and this can be done by Dr. Loanne Drilling as an outpatient. Dictated by:   Jairo Ben, M.D. Attending Physician:  Linna Darner DD:  01/18/02 TD:  01/19/02 Job: [id number]

## 2011-03-23 NOTE — Discharge Summary (Signed)
[place]. [facility]  Patient:    [name], [name] Visit Number: [id number] MRN: [id number]          Service Type: MED Location: [phone] Attending Physician:  Linna Darner Dictated by:   Jairo Ben, M.D. [facility] Admit Date:  01/13/2002 Discharge Date: 01/18/2002   CC:         Gloris Ham, M.D. [facility]   Discharge Summary  DISCHARGE DIAGNOSES: 1. Mild small bowel ileus thought secondary to use of Lomotil for his viral    gastroenteritis. 2. Nausea, vomiting, and diarrhea thought secondary to viral gastroenteritis    which has resolved. 3. History of dyslipidemia. 4. History of type 2 diabetes. 5. History of hypertension. 6. History of carpal tunnel syndrome. 7. The patient also noted to have gallstone on CT scan of his abdomen without    any gallbladder wall thickening. 8. The patient has an enlarged prostate by CT scan. 9. Mild renal insufficiency thought secondary to volume depletion from    dehydration secondary to nausea, vomiting, and diarrhea.  DISCHARGE MEDICATIONS:  The patient is to restart back on his Lipitor and Vasotec by tomorrow, his Actos will be started back once he gets his sugars running greater than 150 just to be on the safe side.  DISCHARGE FOLLOWUP:  Dr. Renato Shin in 1-2 weeks.  The patient will need a referral to general surgery as an outpatient to address the gallstone if this patient needs to undergo cholecystectomy electively at some point in time.  At this point in time it certainly was not an emergency for him to have an inpatient surgical consult.  DISCHARGE DIET:  The patient recommended to be on a bland diet for the next week or so and to increase his dairy products in a weeks time although he currently is to abstain from it.  DISCHARGE ACTIVITY:  As tolerated.  He should be able to return to work tomorrow.  HOSPITAL COURSE:  The patient was admitted by Dr.  Loanne Drilling from the clinic on January 14, 2002 with complaints of severe nausea, vomiting and diarrhea and with some periumbilical pain and myalgias.  He was on x-ray findings noted to have some mild ileus and was initially thought to have some small-bowel obstruction.  The patient was kept on bowel rest, had an NG tube placed, and started on IV fluids.  The patient continued to do well, his NG was initially clamped and subsequently discontinued on Friday and he has tolerated that very well.  His repeat abdominal films done on Thursday showed improvement in the small bowel dilatation without any signs of obstruction which clearly suggested an improvement.  The patient tolerated clear liquids initially.  A CT of the abdomen and pelvis was done because he was still having some bowel dilatation and that essentially has come back normal except for the fact that he was noted to have gallstone without any gallbladder wall thickening, also noted was an enlarged prostate with some calcification.  This certainly can be addressed as an outpatient also if he has not had a PSA done he can have that done by Dr. Loanne Drilling.  His initial set of laboratory workup showed a total white count of 15.4 with an initial H&H of 19 and 55.  A repeat done on January 17, 2002 showed a white count of 8.9 with an H&H of 13.6 and 38.8, platelet count is 259,000, this was on the blood count done on  January 17, 2002.  The rest of his indices were normal.  His CMP initially showed a sodium of 127, potassium of 4.2, chloride and CO2 were 96 and 19, glucose of 243, BUN and creatinine were 39 and 2.3, total bili of 0.7, rest of the LFTs really were normal.  A repeat BMP done on January 17, 2002 showed his sodium to be 137, potassium of 3.9, chloride and CO2 were 102 and 29, glucose was 130, BUN and creatinine were 11 and 1.0, respectively.  In regards to his diabetes mellitus: Since we had kept him n.p.o. and had just put him on clear  liquids his sugars actually have been running very good without needing sliding scale coverage, we have kept him off of his Actos and he is to restart his Actos as an outpatient.  I recommended to the patient and his wife for him to have his blood sugars checked three times a day and if it is consistently running greater than 150 to go ahead and start his Actos of 15 mg p.o. q.d.  In regards to his blood pressure: Since his diagnosis was dehydration as evidenced by the laboratory numbers, his Enalapril was held and this is continuing to be held since his blood pressure has not been terribly elevated, there have been some periodic elevations but I recommended that he start back on his Vasotec tomorrow morning.  In regards to his gallstone: The patient really has been asymptomatic from this standpoint and it probably is just an incidental finding but probably a good idea to get this addressed as an outpatient with general surgery referral and they can decide whether they will do an elective cholecystectomy and the patient would like to get that taken care of.  DISPOSITION:  The patient is being discharged to home in stable condition. Dictated by:   Jairo Ben, M.D. [place] Attending Physician:  Linna Darner DD:  01/18/02 TD:  01/19/02 Job: [id number]

## 2011-03-23 NOTE — H&P (Signed)
[place]. [facility]  Patient:    [name], [name] Visit Number: [id number] MRN: [id number]          Service Type: MED Location: [phone] Attending Physician:  Linna Darner Dictated by:   Gloris Ham, M.D. [facility] Admit Date:  01/13/2002                           History and Physical  REASON FOR ADMISSION:  Vomiting.  HISTORY OF PRESENT ILLNESS:  The patient is a 70 year old man with four days of severe nausea, vomiting, and diarrhea.  There is associated periumbilical pain and myalgias.  PAST MEDICAL HISTORY: 1. Dyslipidemia. 2. Type 2 diabetes. 3. Carpal tunnel syndrome. 4. Hypertension.  MEDICATIONS:  Chronically are Lipitor, Vasotec, and Actos.  Medications prescribed in the office two days are Phenergan, Lomotil, Levsin, Nexium, and Ambien.  SOCIAL HISTORY:  The patient is married.  His wife is here.  He does not smoke, drink, or do drugs.  FAMILY HISTORY:  No one else at home is ill.  REVIEW OF SYSTEMS:  Denies the following; bright red blood per rectum, melena, headache, loss of consciousness, chest pain, shortness of breath, jaundice, polyuria, fever, dysuria, cough, and dizziness.  PHYSICAL EXAMINATION:  VITAL SIGNS:  Blood pressure 108/60, heart rate 130, respiratory rate 20, and the patient is afebrile.  GENERAL:  No distress.  SKIN:  Not diaphoretic.  HEENT:  Head is atraumatic.  Sclerae nonicteric.  Pharynx is clear.  NECK:  Supple.  CHEST:  Clear to auscultation.  CARDIOVASCULAR:  No JVD, no edema.  Tachycardic, regular rhythm, no murmur.  ABDOMEN:  Soft, but it is distended.  It is obese, and obesity limits the examination.  No hepatosplenomegaly.  No mass.  Bowel sounds are slightly increased in intensity and pitch, but only slightly so.  No hernia present.  RECTAL:  Per the emergency room physician is Hemoccult positive.  EXTREMITIES:  No obvious deformity of the joints.  No ulcer on the  feet.  NEUROLOGIC:  Alert and well-oriented.  Cranial nerves are grossly intact.  The patient moves all fours and sensation is intact to touch in the feet.  LABORATORY STUDIES:  Remarkable for a sodium of 127, BUN 42, glucose 157.  WBC 15,400.  Urinalysis normal.  X-rays of the chest are clear.  Abdomen shows dilated small bowel loops with the possibility of small bowel obstruction.  IMPRESSION: 1. Partial small bowel obstruction. 2. Type 2 diabetes. 3. Hyponatremia.  PLAN: 1. Intravenous fluids. 2. Symptomatic therapy for nausea. 3. PPI therapy. 4. Recheck CBC and BMET in morning. 5. Check LFT and amylase. 6. Hold Lipitor, Vasotec, and Actos. 7. PRN subcutaneous insulin. 8. Full code at the patients request. Dictated by:   Gloris Ham, M.D. [place] Attending Physician:  Linna Darner DD:  01/14/02 TD:  01/14/02 Job: [id number]

## 2011-08-20 ENCOUNTER — Other Ambulatory Visit: Payer: Self-pay | Admitting: Endocrinology

## 2011-12-20 ENCOUNTER — Other Ambulatory Visit (INDEPENDENT_AMBULATORY_CARE_PROVIDER_SITE_OTHER): Payer: Medicare Other

## 2011-12-20 ENCOUNTER — Encounter: Payer: Self-pay | Admitting: Endocrinology

## 2011-12-20 ENCOUNTER — Ambulatory Visit (INDEPENDENT_AMBULATORY_CARE_PROVIDER_SITE_OTHER): Payer: Medicare Other | Admitting: Endocrinology

## 2011-12-20 VITALS — BP 110/58 | HR 73 | Temp 97.8°F | Ht 68.0 in | Wt 223.0 lb

## 2011-12-20 DIAGNOSIS — E119 Type 2 diabetes mellitus without complications: Secondary | ICD-10-CM

## 2011-12-20 NOTE — Patient Instructions (Addendum)
Please come back for a regular physical appointment in 3 months.   blood tests are being requested for you today.  please call [phone] to hear your test results.  You will be prompted to enter the 9-digit "MRN" number that appears at the top left of this page, followed by #.  Then you will hear the message. Based on the results, i will probably reduce the actos.   (update: i left message on phone-tree:  Stop actos.  Call when cbg's go over 150, so i can rx metformin)

## 2011-12-20 NOTE — Progress Notes (Signed)
  Subjective:    Patient ID: [name], male    DOB: 09/17/1941, 71 y.o.   MRN: [id number]  HPI Pt returns for f/u of type 2 DM (2002).  Pt reports few mos of slight swelling of the legs, but no assoc sob.   He takes meds as rx'ed.   Past Medical History  Diagnosis Date  . DIABETES MELLITUS, TYPE II 07/09/2007  . HYPERLIPIDEMIA 07/09/2007  . HYPERTENSION 07/09/2007  . FATTY LIVER DISEASE 01/12/2009    Past Surgical History  Procedure Date  . Stress cardiolite 08/23/2004    History   Social History  . Marital Status: Married    Spouse Name: N/A    Number of Children: N/A  . Years of Education: N/A   Occupational History  . Retired    Social History Main Topics  . Smoking status: Never Smoker   . Smokeless tobacco: Not on file  . Alcohol Use:   . Drug Use:   . Sexually Active:    Other Topics Concern  . Not on file   Social History Narrative  . No narrative on file    Current Outpatient Prescriptions on File Prior to Visit  Medication Sig Dispense Refill  . aspirin 81 MG tablet Take 81 mg by mouth daily.        Marland Kitchen atorvastatin (LIPITOR) 80 MG tablet TAKE 1 TABLET EVERY DAY BY MOUTH  90 tablet  1  . fluticasone (FLONASE) 50 MCG/ACT nasal spray 2 sprays by Nasal route daily.        Marland Kitchen losartan (COZAAR) 100 MG tablet Take 100 mg by mouth daily.          Allergies  Allergen Reactions  . Lovastatin     REACTION: Foot pain  . Simvastatin     REACTION: Myalgias    Family History  Problem Relation Age of Onset  . Cancer Neg Hx     BP 110/58  Pulse 73  Temp(Src) 97.8 F (36.6 C) (Oral)  Ht 5' 8"  (1.727 m)  Wt 223 lb (101.152 kg)  BMI 33.91 kg/m2  SpO2 97%    Review of Systems Denies weight change and chest pain.    Objective:   Physical Exam VITAL SIGNS:  See vs page GENERAL: no distress Pulses: dorsalis pedis intact bilat.   Feet: no deformity.  no ulcer on the feet.  feet are of normal color and temp.  1+ bilat leg edema.  There is bilateral  onychomycosis.  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   Neuro: sensation is intact to touch on the feet.     Lab Results  Component Value Date   HGBA1C 6.4 12/20/2011      Assessment & Plan:  Dm, well-controlled Edema, due to actos, new

## 2011-12-25 ENCOUNTER — Other Ambulatory Visit: Payer: Self-pay | Admitting: *Deleted

## 2011-12-25 MED ORDER — LOSARTAN POTASSIUM 100 MG PO TABS: 100.0000 mg | ORAL_TABLET | Freq: Every day | ORAL | Status: DC

## 2011-12-25 MED ORDER — ATORVASTATIN CALCIUM 80 MG PO TABS: ORAL_TABLET | ORAL | Status: DC

## 2011-12-25 MED ORDER — FLUTICASONE PROPIONATE 50 MCG/ACT NA SUSP: 2.0000 | Freq: Every day | NASAL | Status: DC

## 2011-12-25 NOTE — Telephone Encounter (Signed)
Pt is requesting refill of Metformin as dicussed at last OV . Pt also wants rx's printed for year's supply for Lipitor, Flonase, Losartan to take to new pharmacy as well as Metformin.

## 2011-12-25 NOTE — Telephone Encounter (Signed)
Please advise on Metformin Rx.

## 2012-02-10 ENCOUNTER — Ambulatory Visit (INDEPENDENT_AMBULATORY_CARE_PROVIDER_SITE_OTHER): Payer: Medicare Other | Admitting: Family Medicine

## 2012-02-10 VITALS — BP 149/75 | HR 109 | Temp 100.0°F | Resp 18 | Ht 67.0 in | Wt 216.0 lb

## 2012-02-10 DIAGNOSIS — J039 Acute tonsillitis, unspecified: Secondary | ICD-10-CM

## 2012-02-10 DIAGNOSIS — H6690 Otitis media, unspecified, unspecified ear: Secondary | ICD-10-CM

## 2012-02-10 DIAGNOSIS — J029 Acute pharyngitis, unspecified: Secondary | ICD-10-CM

## 2012-02-10 DIAGNOSIS — H65199 Other acute nonsuppurative otitis media, unspecified ear: Secondary | ICD-10-CM

## 2012-02-10 NOTE — Progress Notes (Signed)
This is a 71 year old gentleman, retired, comes in with 3 days of sore throat and left ear pain. He's having a fever as well. He's had a mild cough which is nonproductive. He has some diffuse myalgias which are worsening.  Objective: Alert and cooperative and in no acute distress  Left TM red  Oropharynx red without exudate   neck: Supple no adenopathy  Chest: Clear, occasional cough in the room  Heart: Regular no murmur  Assessment: Left otitis media  Plan: Augmentin 875 twice a day x7 days, Hydromet 4 ounces-5 mL

## 2012-08-07 ENCOUNTER — Telehealth: Payer: Self-pay

## 2012-08-08 ENCOUNTER — Telehealth: Payer: Self-pay

## 2012-08-08 NOTE — Telephone Encounter (Signed)
LMOVM for pt to rtn call

## 2012-08-12 NOTE — Telephone Encounter (Signed)
Already addressed in a different phone note.

## 2012-08-12 NOTE — Telephone Encounter (Signed)
Pt advised per dr. Loanne Drilling after reviewing pt's chart he can can keep f/u appt as scheduled

## 2013-01-06 ENCOUNTER — Ambulatory Visit (INDEPENDENT_AMBULATORY_CARE_PROVIDER_SITE_OTHER): Payer: Medicare Other | Admitting: Endocrinology

## 2013-01-06 ENCOUNTER — Encounter: Payer: Self-pay | Admitting: Endocrinology

## 2013-01-06 ENCOUNTER — Other Ambulatory Visit: Payer: Self-pay | Admitting: *Deleted

## 2013-01-06 ENCOUNTER — Other Ambulatory Visit: Payer: Self-pay | Admitting: Endocrinology

## 2013-01-06 VITALS — BP 124/80 | HR 78 | Wt 211.0 lb

## 2013-01-06 DIAGNOSIS — Z0000 Encounter for general adult medical examination without abnormal findings: Secondary | ICD-10-CM

## 2013-01-06 DIAGNOSIS — G610 Guillain-Barre syndrome: Secondary | ICD-10-CM | POA: Insufficient documentation

## 2013-01-06 DIAGNOSIS — R972 Elevated prostate specific antigen [PSA]: Secondary | ICD-10-CM

## 2013-01-06 DIAGNOSIS — K7689 Other specified diseases of liver: Secondary | ICD-10-CM

## 2013-01-06 DIAGNOSIS — E119 Type 2 diabetes mellitus without complications: Secondary | ICD-10-CM

## 2013-01-06 DIAGNOSIS — E785 Hyperlipidemia, unspecified: Secondary | ICD-10-CM

## 2013-01-06 DIAGNOSIS — I10 Essential (primary) hypertension: Secondary | ICD-10-CM

## 2013-01-06 LAB — URINALYSIS, ROUTINE W REFLEX MICROSCOPIC
Leukocytes, UA: NEGATIVE
Nitrite: NEGATIVE
Specific Gravity, Urine: 1.025 (ref 1.000–1.030)
Urobilinogen, UA: 0.2 (ref 0.0–1.0)

## 2013-01-06 LAB — CBC WITH DIFFERENTIAL/PLATELET
Basophils Absolute: 0.1 10*3/uL (ref 0.0–0.1)
Eosinophils Absolute: 0.1 10*3/uL (ref 0.0–0.7)
Lymphocytes Relative: 23.1 % (ref 12.0–46.0)
MCHC: 33.4 g/dL (ref 30.0–36.0)
Neutrophils Relative %: 67.1 % (ref 43.0–77.0)
RBC: 5.01 Mil/uL (ref 4.22–5.81)
RDW: 13.3 % (ref 11.5–14.6)

## 2013-01-06 LAB — BASIC METABOLIC PANEL
BUN: 17 mg/dL (ref 6–23)
CO2: 32 mEq/L (ref 19–32)
Chloride: 104 mEq/L (ref 96–112)
Creatinine, Ser: 1.1 mg/dL (ref 0.4–1.5)
Glucose, Bld: 200 mg/dL — ABNORMAL HIGH (ref 70–99)

## 2013-01-06 LAB — HEPATIC FUNCTION PANEL
ALT: 64 U/L — ABNORMAL HIGH (ref 0–53)
AST: 43 U/L — ABNORMAL HIGH (ref 0–37)
Albumin: 3.8 g/dL (ref 3.5–5.2)
Total Bilirubin: 0.9 mg/dL (ref 0.3–1.2)
Total Protein: 7 g/dL (ref 6.0–8.3)

## 2013-01-06 LAB — LIPID PANEL: VLDL: 23.8 mg/dL (ref 0.0–40.0)

## 2013-01-06 LAB — HEMOGLOBIN A1C: Hgb A1c MFr Bld: 7.2 % — ABNORMAL HIGH (ref 4.6–6.5)

## 2013-01-06 LAB — TSH: TSH: 1 u[IU]/mL (ref 0.35–5.50)

## 2013-01-06 MED ORDER — METFORMIN HCL ER (MOD) 500 MG PO TB24: 1000.0000 mg | ORAL_TABLET | Freq: Every day | ORAL | Status: DC

## 2013-01-06 MED ORDER — ATORVASTATIN CALCIUM 80 MG PO TABS: ORAL_TABLET | ORAL | Status: DC

## 2013-01-06 MED ORDER — LOSARTAN POTASSIUM 100 MG PO TABS: 100.0000 mg | ORAL_TABLET | Freq: Every day | ORAL | Status: DC

## 2013-01-06 NOTE — Patient Instructions (Addendum)
(  pt is called, and advised to take metformin)

## 2013-01-06 NOTE — Progress Notes (Signed)
  Subjective:    Patient ID: [name], male    DOB: 06-25-1941, 72 y.o.   MRN: [id number]  HPI Pt returns for f/u of type 2 DM.  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  pt states he feels well in general. Past Medical History  Diagnosis Date  . DIABETES MELLITUS, TYPE II 07/09/2007  . HYPERLIPIDEMIA 07/09/2007  . HYPERTENSION 07/09/2007  . FATTY LIVER DISEASE 01/12/2009    Past Surgical History  Procedure Laterality Date  . Stress cardiolite  08/23/2004    History   Social History  . Marital Status: Married    Spouse Name: N/A    Number of Children: N/A  . Years of Education: N/A   Occupational History  . Retired    Social History Main Topics  . Smoking status: Never Smoker   . Smokeless tobacco: Not on file  . Alcohol Use:   . Drug Use:   . Sexually Active:    Other Topics Concern  . Not on file   Social History Narrative  . No narrative on file    Current Outpatient Prescriptions on File Prior to Visit  Medication Sig Dispense Refill  . aspirin 81 MG tablet Take 81 mg by mouth daily.        . fluticasone (FLONASE) 50 MCG/ACT nasal spray Place 2 sprays into the nose daily.  48 g  3   No current facility-administered medications on file prior to visit.    Allergies  Allergen Reactions  . Lovastatin     REACTION: Foot pain  . Simvastatin     REACTION: Myalgias    Family History  Problem Relation Age of Onset  . Cancer Neg Hx     BP 124/80  Pulse 78  Wt 211 lb (95.709 kg)  BMI 33.04 kg/m2  SpO2 96%    Review of Systems He says edema is less, but it persists    Objective:   Physical Exam Pulses: dorsalis pedis intact bilat.   Feet: no deformity.  no ulcer on the feet.  feet are of normal color and temp.  1+ pretib edema.  There is bilateral onychomycosis. Neuro: sensation is intact to touch on the feet (note: please disregard foot exam in cpx visit today--this exam is correct)    Lab Results  Component  Value Date   HGBA1C 7.2* 01/06/2013      Assessment & Plan:  DM, [name] increased rx Edema:  This precludes resumption of the actos

## 2013-01-06 NOTE — Progress Notes (Signed)
  Subjective:    Patient ID: [name], male    DOB: 01/02/1941, 72 y.o.   MRN: [id number]  HPI here for regular wellness examination.  He's feeling pretty well in general, and says chronic med probs are stable, except as noted below    Review of Systems  Constitutional: Negative for fever and unexpected weight change.  HENT: Negative for hearing loss.   Eyes: Negative for visual disturbance.  Respiratory: Negative for shortness of breath.   Cardiovascular: Negative for chest pain.  Gastrointestinal: Negative for anal bleeding.  Endocrine: Negative for polyuria.  Genitourinary: Negative for hematuria and difficulty urinating.  Musculoskeletal: Negative for back pain.  Skin: Negative for rash.  Allergic/Immunologic: Negative for environmental allergies.  Neurological: Negative for syncope and numbness.  Hematological: Does not bruise/bleed easily.  Psychiatric/Behavioral: Negative for decreased concentration.       Objective:   Physical Exam VS: see vs page GEN: no distress HEAD: head: no deformity eyes: no periorbital swelling, no proptosis external nose and ears are normal mouth: no lesion seen NECK: supple, thyroid is not enlarged CHEST WALL: no deformity LUNGS: clear to auscultation BREASTS:  No gynecomastia CV: reg rate and rhythm, no murmur ABD: abdomen is soft, nontender.  no hepatosplenomegaly.  not distended.  no hernia RECTAL: normal external and internal exam.  heme neg. PROSTATE:  Normal size.  No nodule MUSCULOSKELETAL: muscle bulk and strength are grossly normal.  no obvious joint swelling.  gait is normal and steady EXTEMITIES: no deformity.  no ulcer on the feet.  feet are of normal color and temp.  no edema PULSES: dorsalis pedis intact bilat.  no carotid bruit NEURO:  cn 2-12 grossly intact.   readily moves all 4's.  sensation is intact to touch on the feet SKIN:  Normal texture and temperature.  No rash or suspicious lesion is visible.   NODES:   None palpable at the neck. PSYCH: alert, oriented x3.  Does not appear anxious nor depressed.   (ecg not done, as the machine does not have paper)    Assessment & Plan:  Wellness visit today, with problems stable, except as noted.  we discussed code status.  pt requests full code, but would not want to be started or maintained on artificial life-support measures if there was not a reasonable chance of recovery

## 2013-01-06 NOTE — Addendum Note (Signed)
Addended by: Renato Shin on: 01/06/2013 06:58 PM   Modules accepted: Level of Service

## 2013-02-20 ENCOUNTER — Ambulatory Visit (INDEPENDENT_AMBULATORY_CARE_PROVIDER_SITE_OTHER): Payer: Medicare Other | Admitting: Family Medicine

## 2013-02-20 VITALS — BP 136/82 | HR 70 | Temp 98.1°F | Resp 16 | Ht 68.0 in | Wt 210.0 lb

## 2013-02-20 DIAGNOSIS — S81009A Unspecified open wound, unspecified knee, initial encounter: Secondary | ICD-10-CM

## 2013-02-20 DIAGNOSIS — J029 Acute pharyngitis, unspecified: Secondary | ICD-10-CM

## 2013-02-20 DIAGNOSIS — J309 Allergic rhinitis, unspecified: Secondary | ICD-10-CM

## 2013-02-20 DIAGNOSIS — S81802A Unspecified open wound, left lower leg, initial encounter: Secondary | ICD-10-CM

## 2013-02-20 DIAGNOSIS — R05 Cough: Secondary | ICD-10-CM

## 2013-02-20 DIAGNOSIS — Z23 Encounter for immunization: Secondary | ICD-10-CM

## 2013-02-20 DIAGNOSIS — R059 Cough, unspecified: Secondary | ICD-10-CM

## 2013-02-20 LAB — POCT RAPID STREP A (OFFICE): Rapid Strep A Screen: NEGATIVE

## 2013-02-20 MED ORDER — BENZONATATE 100 MG PO CAPS: 100.0000 mg | ORAL_CAPSULE | Freq: Three times a day (TID) | ORAL | Status: DC | PRN

## 2013-02-20 MED ORDER — DOXYCYCLINE HYCLATE 100 MG PO TABS: 100.0000 mg | ORAL_TABLET | Freq: Two times a day (BID) | ORAL | Status: DC

## 2013-02-20 MED ORDER — TETANUS-DIPHTHERIA TOXOIDS TD 2-2 LF/0.5ML IM SUSP: 0.5000 mL | Freq: Once | INTRAMUSCULAR | Status: DC

## 2013-02-20 MED ORDER — FLUTICASONE PROPIONATE 50 MCG/ACT NA SUSP: 2.0000 | Freq: Every day | NASAL | Status: DC

## 2013-02-20 NOTE — Patient Instructions (Addendum)
You got a tetanus shot today which is good for 10 years- 8 years if you have a significant wound.  We are going to use the doxycycline medication for your leg wound.  This will also help with any sinus or chest infection.  However, I think your sore throat/ cough/ nasal symptoms are due to allergies.  Use the flonase nasal spray for allergies.  You can also use the tessalon perles for your cough

## 2013-02-20 NOTE — Progress Notes (Signed)
[facility] [address] [place] [address] [phone]  Date:  02/20/2013   Name:  [name]   DOB:  [birth year]   MRN:  [id number]  PCP:  Renato Shin, MD    Chief Complaint: Sore Throat and Wound Check   History of Present Illness:  [name] is a 72 y.o. very pleasant male patient who presents with the following:  He is here today with a ST for a couple of days.  He has noted that his ears are stuffy, he has a slight cough, slight runny nose, sneezing, no itchy eyes.  "If I don't get it fast it will turn into bronchitis."  No fever or chills, no GI symptoms  He scraped his right shin 2 days ago on a picnic table.  He notes some redness, warmth, and soreness around the site of the scrape. He is afraid it is getting infected and that he needs a tetanus shot.    His last tetanus shot was a Td in 2004.  He has a history of DM and sees Dr. Arnoldo Lenis for this.  A1c last month 7.2%. 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  Thought to have been caused by a viral infection, not related to any immunization per his report.  His recovery was nearly 100%  Discussed with pharmD at [facility]- as long as past incidence of Guillain- Josefa Half was not within 6 weeks of a tetanus shot he is ok to receive Td.  Confirmed with pt that his GB was NOT related to any immunization, and he did receive a Td shot 10 years ago wihtout ill effect.  He is ok to receive a Td shot today.    Patient Active Problem List  Diagnosis  . DIABETES MELLITUS, TYPE II  . HYPERLIPIDEMIA  . HYPERTENSION  . FATTY LIVER DISEASE  . PENILE PAIN  . KNEE PAIN, RIGHT  . EDEMA  . PSA, INCREASED  . COUGH DUE TO ACE INHIBITORS  . Routine general medical examination at a health care facility  . Guillain-Barre syndrome    Past Medical History  Diagnosis Date  . DIABETES MELLITUS, TYPE II 07/09/2007  . HYPERLIPIDEMIA 07/09/2007  . HYPERTENSION 07/09/2007  . FATTY LIVER DISEASE 01/12/2009     Past Surgical History  Procedure Laterality Date  . Stress cardiolite  08/23/2004    History  Substance Use Topics  . Smoking status: Never Smoker   . Smokeless tobacco: Not on file  . Alcohol Use: No    Family History  Problem Relation Age of Onset  . Cancer Neg Hx     Allergies  Allergen Reactions  . Lovastatin     REACTION: Foot pain  . Simvastatin     REACTION: Myalgias    Medication list has been reviewed and updated.  Current Outpatient Prescriptions on File Prior to Visit  Medication Sig Dispense Refill  . aspirin 81 MG tablet Take 81 mg by mouth daily.        Marland Kitchen atorvastatin (LIPITOR) 80 MG tablet TAKE 1 TABLET EVERY DAY BY MOUTH  90 tablet  3  . losartan (COZAAR) 100 MG tablet Take 1 tablet (100 mg total) by mouth daily.  90 tablet  3  . metFORMIN (GLUMETZA) 500 MG (MOD) 24 hr tablet Take 2 tablets (1,000 mg total) by mouth daily with breakfast.  180 tablet  3  . fluticasone (FLONASE) 50 MCG/ACT nasal spray Place 2 sprays into  the nose daily.  48 g  3   No current facility-administered medications on file prior to visit.    Review of Systems:  As per HPI- otherwise negative.   Physical Examination: Filed Vitals:   02/20/13 1324  BP: 136/82  Pulse: 70  Temp: 98.1 F (36.7 C)  Resp: 16   Filed Vitals:   02/20/13 1324  Height: 5' 8"  (1.727 m)  Weight: 210 lb (95.255 kg)   Body mass index is 31.94 kg/(m^2). Ideal Body Weight: Weight in (lb) to have BMI = 25: 164.1  GEN: WDWN, NAD, Non-toxic, A & O x 3 HEENT: Atraumatic, Normocephalic. Neck supple. No masses, No LAD.  Bilateral TM wnl, oropharynx normal.  PEERL,EOMI.   Nasal cavity erythematous Ears and Nose: No external deformity. CV: RRR, No M/G/R. No JVD. No thrill. No extra heart sounds. PULM: CTA B, no wheezes, crackles, rhonchi. No retractions. No resp. distress. No accessory muscle use. ABD: S, NT ND, +BS. No rebound. No HSM. EXTR: No c/c/e NEURO Normal gait.  PSYCH: Normally  interactive. Conversant. Not depressed or anxious appearing.  Calm demeanor.  Right shin: there is a c- shaped abrasion with slight warmth and tednerness and surrounding erythema at the inferior part of the wound.  No need for sutures, but the area around the would is mildly cellulitic  Results for orders placed in visit on 02/20/13  POCT RAPID STREP A (OFFICE)      Result Value Range   Rapid Strep A Screen Negative  Negative    Assessment and Plan: Sore throat - Plan: POCT rapid strep A  Wound of left leg, initial encounter - Plan: diptheria-tetanus toxoids (DECAVAC) 2-2 LF/0.5ML injection, doxycycline (VIBRA-TABS) 100 MG tablet  Allergic rhinitis - Plan: fluticasone (FLONASE) 50 MCG/ACT nasal spray, fluticasone (FLONASE) 50 MCG/ACT nasal spray  Cough - Plan: benzonatate (TESSALON) 100 MG capsule  Likely viral URI or allergies.  Refilled his flonase and rx tessalon to use as needed.   Wound right leg; updated Td today. Doxycycline for cellulitis around wound.    Patient (or parent if minor) instructed to return to clinic or call if not better in 2-3 day(s).   Signed Lamar Blinks, MD

## 2013-05-15 ENCOUNTER — Ambulatory Visit (INDEPENDENT_AMBULATORY_CARE_PROVIDER_SITE_OTHER): Payer: Medicare Other | Admitting: Family Medicine

## 2013-05-15 VITALS — BP 114/72 | HR 63 | Temp 98.0°F | Resp 16 | Ht 67.0 in | Wt 211.0 lb

## 2013-05-15 DIAGNOSIS — R05 Cough: Secondary | ICD-10-CM

## 2013-05-15 DIAGNOSIS — J069 Acute upper respiratory infection, unspecified: Secondary | ICD-10-CM

## 2013-05-15 DIAGNOSIS — R059 Cough, unspecified: Secondary | ICD-10-CM

## 2013-05-15 DIAGNOSIS — J209 Acute bronchitis, unspecified: Secondary | ICD-10-CM

## 2013-05-15 MED ORDER — BENZONATATE 100 MG PO CAPS: 100.0000 mg | ORAL_CAPSULE | Freq: Three times a day (TID) | ORAL | Status: DC | PRN

## 2013-05-15 MED ORDER — AMOXICILLIN 875 MG PO TABS: 875.0000 mg | ORAL_TABLET | Freq: Two times a day (BID) | ORAL | Status: DC

## 2013-05-15 MED ORDER — HYDROCODONE-HOMATROPINE 5-1.5 MG/5ML PO SYRP: 5.0000 mL | ORAL_SOLUTION | ORAL | Status: DC | PRN

## 2013-05-15 NOTE — Progress Notes (Signed)
Subjective: About a week ago the patient developed a URI cough. The cough continues to persist. It bothers him at night. His wife was sick also, and has been treated for bronchitis. He does not smoke. He brings up a little phlegm, not a lot. This been going on several days now.  Objective: Healthy-appearing man. TMs are normal. Nose mild congestion on the right. His throat is clear. Neck supple without nodes or thyromegaly. Chest is clear to auscultation.  Assessment: Cough URI Mild bronchitis  Plan: Prescribed amoxicillin because it is arty been going on over a week. Gave cough medications. Return if worse.

## 2013-05-15 NOTE — Patient Instructions (Signed)
Drink plenty of fluids  Take the cough pills in the daytime, and use the cough syrup at night. The cough syrup will make you drowsy.  Take the amoxicillin one twice daily for infection

## 2013-07-27 ENCOUNTER — Encounter: Payer: Self-pay | Admitting: Endocrinology

## 2013-07-27 ENCOUNTER — Ambulatory Visit (INDEPENDENT_AMBULATORY_CARE_PROVIDER_SITE_OTHER): Payer: Medicare Other | Admitting: Endocrinology

## 2013-07-27 VITALS — BP 122/70 | HR 78 | Ht 68.0 in | Wt 212.0 lb

## 2013-07-27 DIAGNOSIS — E119 Type 2 diabetes mellitus without complications: Secondary | ICD-10-CM

## 2013-07-27 DIAGNOSIS — K7689 Other specified diseases of liver: Secondary | ICD-10-CM

## 2013-07-27 LAB — HEMOGLOBIN A1C: Hgb A1c MFr Bld: 7.3 % — ABNORMAL HIGH (ref 4.6–6.5)

## 2013-07-27 MED ORDER — METFORMIN HCL ER 500 MG PO TB24: 1000.0000 mg | ORAL_TABLET | Freq: Two times a day (BID) | ORAL | Status: DC

## 2013-07-27 NOTE — Progress Notes (Signed)
  Subjective:    Patient ID: [name], male    DOB: November 06, 1940, 72 y.o.   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 pt states he feels well in general.  He tolerates metformin well.  He has few years of slight swelling of the legs, but no assoc sob.   Past Medical History  Diagnosis Date  . DIABETES MELLITUS, TYPE II 07/09/2007  . HYPERLIPIDEMIA 07/09/2007  . HYPERTENSION 07/09/2007  . FATTY LIVER DISEASE 01/12/2009    Past Surgical History  Procedure Laterality Date  . Stress cardiolite  08/23/2004    History   Social History  . Marital Status: Married    Spouse Name: N/A    Number of Children: N/A  . Years of Education: N/A   Occupational History  . Retired    Social History Main Topics  . Smoking status: Never Smoker   . Smokeless tobacco: Not on file  . Alcohol Use: No  . Drug Use: No  . Sexual Activity: Yes   Other Topics Concern  . Not on file   Social History Narrative  . No narrative on file    Current Outpatient Prescriptions on File Prior to Visit  Medication Sig Dispense Refill  . aspirin 81 MG tablet Take 81 mg by mouth daily.        Marland Kitchen atorvastatin (LIPITOR) 80 MG tablet TAKE 1 TABLET EVERY DAY BY MOUTH  90 tablet  3  . fluticasone (FLONASE) 50 MCG/ACT nasal spray Place 2 sprays into the nose daily.  48 g  3  . losartan (COZAAR) 100 MG tablet Take 1 tablet (100 mg total) by mouth daily.  90 tablet  3   No current facility-administered medications on file prior to visit.    Allergies  Allergen Reactions  . Lovastatin     REACTION: Foot pain  . Simvastatin     REACTION: Myalgias    Family History  Problem Relation Age of Onset  . Cancer Neg Hx     BP 122/70  Pulse 78  Ht 5' 8"  (1.727 m)  Wt 212 lb (96.163 kg)  BMI 32.24 kg/m2  SpO2 98%  Review of Systems Denies weight change and hypoglycemia.      Objective:   Physical Exam VITAL  SIGNS:  See vs page GENERAL: no distress  Lab Results  Component Value Date   HGBA1C 7.3* 07/27/2013      Assessment & Plan:  DM: [name] increased rx, if it can be done with a regimen that avoids or minimizes hypoglycemia. Nash: weight loss will help Edema: this limits rx of nash

## 2013-07-27 NOTE — Patient Instructions (Addendum)
check your blood sugar once a day.  vary the time of day when you check, between before the 3 meals, and at bedtime.  also check if you have symptoms of your blood sugar being too high or too low.  please keep a record of the readings and bring it to your next appointment here.  please call us sooner if your blood sugar goes below 70, or if you have a lot of readings over 200. A diabetes blood test is requested for you today.  We'll contact you with results. Please come back for a regular physical appointment in 6 months.

## 2013-09-10 ENCOUNTER — Other Ambulatory Visit: Payer: Self-pay

## 2013-09-15 ENCOUNTER — Encounter: Payer: Self-pay | Admitting: Cardiology

## 2013-12-10 ENCOUNTER — Ambulatory Visit (INDEPENDENT_AMBULATORY_CARE_PROVIDER_SITE_OTHER): Payer: Medicare Other | Admitting: Endocrinology

## 2013-12-10 ENCOUNTER — Encounter: Payer: Self-pay | Admitting: Endocrinology

## 2013-12-10 VITALS — BP 128/80 | HR 82 | Temp 97.7°F | Ht 68.0 in | Wt 207.0 lb

## 2013-12-10 DIAGNOSIS — R972 Elevated prostate specific antigen [PSA]: Secondary | ICD-10-CM

## 2013-12-10 DIAGNOSIS — K7689 Other specified diseases of liver: Secondary | ICD-10-CM

## 2013-12-10 DIAGNOSIS — E785 Hyperlipidemia, unspecified: Secondary | ICD-10-CM

## 2013-12-10 DIAGNOSIS — Z0000 Encounter for general adult medical examination without abnormal findings: Secondary | ICD-10-CM

## 2013-12-10 DIAGNOSIS — I10 Essential (primary) hypertension: Secondary | ICD-10-CM

## 2013-12-10 DIAGNOSIS — E119 Type 2 diabetes mellitus without complications: Secondary | ICD-10-CM

## 2013-12-10 LAB — CBC WITH DIFFERENTIAL/PLATELET
BASOS ABS: 0 10*3/uL (ref 0.0–0.1)
Basophils Relative: 0.4 % (ref 0.0–3.0)
EOS ABS: 0.2 10*3/uL (ref 0.0–0.7)
Eosinophils Relative: 1.7 % (ref 0.0–5.0)
HEMATOCRIT: 47.7 % (ref 39.0–52.0)
Hemoglobin: 15.6 g/dL (ref 13.0–17.0)
LYMPHS ABS: 1.8 10*3/uL (ref 0.7–4.0)
Lymphocytes Relative: 20.3 % (ref 12.0–46.0)
MCHC: 32.8 g/dL (ref 30.0–36.0)
MCV: 95.7 fl (ref 78.0–100.0)
MONOS PCT: 8.4 % (ref 3.0–12.0)
Monocytes Absolute: 0.7 10*3/uL (ref 0.1–1.0)
Neutro Abs: 6.2 10*3/uL (ref 1.4–7.7)
Neutrophils Relative %: 69.2 % (ref 43.0–77.0)
PLATELETS: 227 10*3/uL (ref 150.0–400.0)
RBC: 4.99 Mil/uL (ref 4.22–5.81)
RDW: 13.4 % (ref 11.5–14.6)
WBC: 8.9 10*3/uL (ref 4.5–10.5)

## 2013-12-10 LAB — HEPATIC FUNCTION PANEL
ALT: 83 U/L — AB (ref 0–53)
AST: 49 U/L — AB (ref 0–37)
Albumin: 4 g/dL (ref 3.5–5.2)
Alkaline Phosphatase: 93 U/L (ref 39–117)
BILIRUBIN TOTAL: 1.1 mg/dL (ref 0.3–1.2)
Bilirubin, Direct: 0.2 mg/dL (ref 0.0–0.3)
Total Protein: 7.1 g/dL (ref 6.0–8.3)

## 2013-12-10 LAB — URINALYSIS, ROUTINE W REFLEX MICROSCOPIC
BILIRUBIN URINE: NEGATIVE
Hgb urine dipstick: NEGATIVE
Ketones, ur: NEGATIVE
LEUKOCYTES UA: NEGATIVE
NITRITE: NEGATIVE
Specific Gravity, Urine: >=1.03 — AB (ref 1.000–1.030)
Total Protein, Urine: NEGATIVE
UROBILINOGEN UA: 0.2 (ref 0.0–1.0)
Urine Glucose: NEGATIVE
pH: 5.5 (ref 5.0–8.0)

## 2013-12-10 LAB — LIPID PANEL
CHOLESTEROL: 110 mg/dL (ref 0–200)
HDL: 41.7 mg/dL (ref >39.00)
LDL CALC: 55 mg/dL (ref 0–99)
Total CHOL/HDL Ratio: 3
Triglycerides: 65 mg/dL (ref 0.0–149.0)
VLDL: 13 mg/dL (ref 0.0–40.0)

## 2013-12-10 LAB — BASIC METABOLIC PANEL
BUN: 18 mg/dL (ref 6–23)
CHLORIDE: 106 meq/L (ref 96–112)
CO2: 25 meq/L (ref 19–32)
CREATININE: 1.1 mg/dL (ref 0.4–1.5)
Calcium: 9.1 mg/dL (ref 8.4–10.5)
GFR: 70.01 mL/min (ref >60.00)
Glucose, Bld: 110 mg/dL — ABNORMAL HIGH (ref 70–99)
POTASSIUM: 4 meq/L (ref 3.5–5.1)
Sodium: 140 mEq/L (ref 135–145)

## 2013-12-10 LAB — PSA: PSA: 4.22 ng/mL — AB (ref 0.10–4.00)

## 2013-12-10 LAB — HEMOGLOBIN A1C: Hgb A1c MFr Bld: 6.8 % — ABNORMAL HIGH (ref 4.6–6.5)

## 2013-12-10 LAB — MICROALBUMIN / CREATININE URINE RATIO
Creatinine,U: 315 mg/dL
Microalb Creat Ratio: 0.3 mg/g (ref 0.0–30.0)
Microalb, Ur: 0.8 mg/dL (ref 0.0–1.9)

## 2013-12-10 LAB — TSH: TSH: 1.03 u[IU]/mL (ref 0.35–5.50)

## 2013-12-10 MED ORDER — SITAGLIPTIN PHOSPHATE 100 MG PO TABS: 100.0000 mg | ORAL_TABLET | Freq: Every day | ORAL | Status: DC

## 2013-12-10 NOTE — Patient Instructions (Addendum)
blood tests are being requested for you today.  We'll contact you with results.   Please reduce metformin to 1 pill, twice a day.  Based on today's test results, we may need to add "[place]."   please consider these measures for your health:  minimize alcohol.  do not use tobacco products.  have a colonoscopy at least every 10 years from age 73.  keep firearms safely stored.  always use seat belts.  have working smoke alarms in your home.  see an eye doctor and dentist regularly.  never drive under the influence of alcohol or drugs (including prescription drugs).  those with fair skin should take precautions against the sun. Call me if you decide to have a colonoscopy, an this can reduce your risk of dying from cancer.   Please come back for a follow-up appointment in 6 months.

## 2013-12-10 NOTE — Progress Notes (Signed)
Subjective:    Patient ID: [name], male    DOB: [birth year], 73 y.o.   MRN: [id number]  HPI Pt is here for regular wellness examination, and is feeling pretty well in general, and says chronic med probs are stable, except as noted below.  he refuses colonoscopy. Past Medical History  Diagnosis Date  . DIABETES MELLITUS, TYPE II 07/09/2007  . HYPERLIPIDEMIA 07/09/2007  . HYPERTENSION 07/09/2007  . FATTY LIVER DISEASE 01/12/2009    Past Surgical History  Procedure Laterality Date  . Stress cardiolite  08/23/2004    History   Social History  . Marital Status: Married    Spouse Name: N/A    Number of Children: N/A  . Years of Education: N/A   Occupational History  . Retired    Social History Main Topics  . Smoking status: Never Smoker   . Smokeless tobacco: Not on file  . Alcohol Use: No  . Drug Use: No  . Sexual Activity: Yes   Other Topics Concern  . Not on file   Social History Narrative  . No narrative on file    Current Outpatient Prescriptions on File Prior to Visit  Medication Sig Dispense Refill  . aspirin 81 MG tablet Take 81 mg by mouth daily.        Marland Kitchen atorvastatin (LIPITOR) 80 MG tablet TAKE 1 TABLET EVERY DAY BY MOUTH  90 tablet  3  . fluticasone (FLONASE) 50 MCG/ACT nasal spray Place 2 sprays into the nose daily.  48 g  3  . losartan (COZAAR) 100 MG tablet Take 1 tablet (100 mg total) by mouth daily.  90 tablet  3   No current facility-administered medications on file prior to visit.    Allergies  Allergen Reactions  . Lovastatin     REACTION: Foot pain  . Simvastatin     REACTION: Myalgias    Family History  Problem Relation Age of Onset  . Cancer Neg Hx     BP 128/80  Pulse 82  Temp(Src) 97.7 F (36.5 C) (Oral)  Ht 5' 8"  (1.727 m)  Wt 207 lb (93.895 kg)  BMI 31.48 kg/m2  SpO2 97%  Review of Systems  Constitutional: Negative for fever.  HENT: Negative for hearing loss.   Eyes: Negative for visual disturbance.  Respiratory:  Negative for shortness of breath.   Cardiovascular: Negative for chest pain.  Gastrointestinal: Negative for anal bleeding.  Endocrine: Negative for cold intolerance.  Genitourinary: Negative for hematuria.  Musculoskeletal: Negative for back pain.  Skin: Negative for rash.  Allergic/Immunologic: Positive for environmental allergies.  Neurological: Negative for syncope.  Hematological: Does not bruise/bleed easily.  Psychiatric/Behavioral: Negative for dysphoric mood.       Objective:   Physical Exam VS: see vs page GEN: no distress HEAD: head: no deformity eyes: no periorbital swelling, no proptosis external nose and ears are normal mouth: no lesion seen NECK: supple, thyroid is not enlarged CHEST WALL: no deformity LUNGS: clear to auscultation BREASTS:  No gynecomastia CV: reg rate and rhythm, no murmur ABD: abdomen is soft, nontender.  no hepatosplenomegaly.  not distended.  no hernia RECTAL: normal external and internal exam.  heme neg. PROSTATE:  Normal size.  No nodule MUSCULOSKELETAL: muscle bulk and strength are grossly normal.  no obvious joint swelling.  gait is normal and steady PULSES:  no carotid bruit NEURO:  cn 2-12 grossly intact.   readily moves all 4's.   SKIN:  Normal texture and temperature.  No  rash or suspicious lesion is visible.   NODES:  None palpable at the neck PSYCH: alert, well-oriented.  Does not appear anxious nor depressed.  i reviewed electrocardiogram: no change    Assessment & Plan:  Wellness visit today, with problems stable, except as noted. we discussed code status.  pt requests full code, but would not want to be started or maintained on artificial life-support measures if there was not a reasonable chance of recovery     SEPARATE EVALUATION FOLLOWS--EACH PROBLEM HERE IS NEW, NOT RESPONDING TO TREATMENT, OR POSES SIGNIFICANT RISK TO THE PATIENT'S HEALTH: HISTORY OF THE PRESENT ILLNESS: Pt returns for f/u of type 2 DM (dx'ed 2005;  he has mild if any neuropathy of the lower extremities; no known associated chronic complications). pt states he feels well in general.  He has diarrhea since metformin was increased. 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   PHYSICAL EXAMINATION: VITAL SIGNS:  See vs page GENERAL: no distress LAB/XRAY RESULTS: Lab Results  Component Value Date   HGBA1C 6.8* 12/10/2013  IMPRESSION: DM: well-controlled.  However, the reduction of metformin will likely leave him needing increased rx.   Diarrhea: due to metformin NASH: persistent Edema: this precludes use of actos PLAN: See instruction page

## 2013-12-15 ENCOUNTER — Other Ambulatory Visit: Payer: Self-pay

## 2013-12-15 ENCOUNTER — Other Ambulatory Visit: Payer: Self-pay | Admitting: Endocrinology

## 2013-12-15 DIAGNOSIS — J309 Allergic rhinitis, unspecified: Secondary | ICD-10-CM

## 2013-12-15 MED ORDER — FLUTICASONE PROPIONATE 50 MCG/ACT NA SUSP
2.0000 | Freq: Every day | NASAL | Status: DC
Start: 2013-12-15 — End: 2017-05-24

## 2013-12-15 MED ORDER — ATORVASTATIN CALCIUM 80 MG PO TABS: ORAL_TABLET | ORAL | Status: DC

## 2013-12-15 MED ORDER — LOSARTAN POTASSIUM 100 MG PO TABS: 100.0000 mg | ORAL_TABLET | Freq: Every day | ORAL | Status: DC

## 2013-12-15 MED ORDER — METFORMIN HCL ER 500 MG PO TB24: 500.0000 mg | ORAL_TABLET | Freq: Two times a day (BID) | ORAL | Status: DC

## 2014-10-11 ENCOUNTER — Other Ambulatory Visit: Payer: Self-pay

## 2014-10-11 MED ORDER — METFORMIN HCL ER 500 MG PO TB24: 500.0000 mg | ORAL_TABLET | Freq: Two times a day (BID) | ORAL | Status: DC

## 2014-11-29 ENCOUNTER — Ambulatory Visit: Payer: Self-pay | Admitting: Endocrinology

## 2014-11-30 ENCOUNTER — Ambulatory Visit (INDEPENDENT_AMBULATORY_CARE_PROVIDER_SITE_OTHER): Payer: Medicare Other | Admitting: Endocrinology

## 2014-11-30 ENCOUNTER — Encounter: Payer: Self-pay | Admitting: Endocrinology

## 2014-11-30 VITALS — BP 142/88 | HR 62 | Temp 97.8°F | Ht 68.0 in | Wt 208.0 lb

## 2014-11-30 DIAGNOSIS — E119 Type 2 diabetes mellitus without complications: Secondary | ICD-10-CM

## 2014-11-30 DIAGNOSIS — Z0000 Encounter for general adult medical examination without abnormal findings: Secondary | ICD-10-CM

## 2014-11-30 MED ORDER — NATEGLINIDE 120 MG PO TABS: 120.0000 mg | ORAL_TABLET | Freq: Three times a day (TID) | ORAL | Status: DC

## 2014-11-30 MED ORDER — METFORMIN HCL ER 500 MG PO TB24: 500.0000 mg | ORAL_TABLET | Freq: Two times a day (BID) | ORAL | Status: DC

## 2014-11-30 NOTE — Patient Instructions (Addendum)
Please change the januvia to nateglinide. Please come back for a regular physical appointment in 1 month. please consider these measures for your health:  minimize alcohol.  do not use tobacco products.  have a colonoscopy at least every 10 years from age 74.  keep firearms safely stored.  always use seat belts.  have working smoke alarms in your home.  see an eye doctor and dentist regularly.  never drive under the influence of alcohol or drugs (including prescription drugs).  those with fair skin should take precautions against the sun. it is critically important to prevent falling down (keep floor areas well-lit, dry, and free of loose objects.  If you have a cane, walker, or wheelchair, you should use it, even for short trips around the house.  Also, try not to rush).

## 2014-11-30 NOTE — Progress Notes (Signed)
Subjective:    Patient ID: [name], male    DOB: [birth year], 74 y.o.   MRN: [id number]  HPI Pt returns for f/u of diabetes mellitus: DM type: 2 Dx'ed: 6389 Complications: none Therapy: 2 oral meds DKA: never Severe hypoglycemia: never Pancreatitis: never Other: he has never been on insulin.  Interval history: Pt says he cannot afford the [place].  no cbg record, but states cbg's are well-controlled.  Diarrhea is resolved.   Past Medical History  Diagnosis Date  . DIABETES MELLITUS, TYPE II 07/09/2007  . HYPERLIPIDEMIA 07/09/2007  . HYPERTENSION 07/09/2007  . FATTY LIVER DISEASE 01/12/2009    Past Surgical History  Procedure Laterality Date  . Stress cardiolite  08/23/2004    History   Social History  . Marital Status: Married    Spouse Name: N/A    Number of Children: N/A  . Years of Education: N/A   Occupational History  . Retired    Social History Main Topics  . Smoking status: Never Smoker   . Smokeless tobacco: Not on file  . Alcohol Use: No  . Drug Use: No  . Sexual Activity: Yes   Other Topics Concern  . Not on file   Social History Narrative    Current Outpatient Prescriptions on File Prior to Visit  Medication Sig Dispense Refill  . aspirin 81 MG tablet Take 81 mg by mouth daily.      Marland Kitchen atorvastatin (LIPITOR) 80 MG tablet TAKE 1 TABLET EVERY DAY BY MOUTH 90 tablet 3  . fluticasone (FLONASE) 50 MCG/ACT nasal spray Place 2 sprays into both nostrils daily. 48 g 3  . losartan (COZAAR) 100 MG tablet Take 1 tablet (100 mg total) by mouth daily. 90 tablet 3   No current facility-administered medications on file prior to visit.    Allergies  Allergen Reactions  . Lovastatin     REACTION: Foot pain  . Simvastatin     REACTION: Myalgias    Family History  Problem Relation Age of Onset  . Cancer Neg Hx     BP 142/88 mmHg  Pulse 62  Temp(Src) 97.8 F (36.6 C) (Oral)  Ht 5' 8"  (1.727 m)  Wt 208 lb (94.348 kg)  BMI 31.63 kg/m2  SpO2  98%  Review of Systems He denies hypoglycemia    Objective:   Physical Exam VITAL SIGNS:  See vs page GENERAL: no distress Pulses: dorsalis pedis intact bilat.  Feet: no deformity. feet are of normal color and temp. 1+ bilat leg edema. There is bilateral onychomycosis and varicosities. The left great toenail is absent.   Skin: no ulcer on the feet.  Neuro: sensation is intact to touch on the feet.       Assessment & Plan:  DM: apparently well-controlled, but he can't afford [place].  Please change the januvia to nateglinide.     Subjective:   Patient here for Medicare annual wellness visit and management of other chronic and acute problems.     Risk factors: advanced age    58 of Physicians Providing Medical Care to Patient:  See "snapshot"   Activities of Daily Living: In your present state of health, do you have any difficulty performing the following activities?:  Preparing food and eating?: No  Bathing yourself: No  Getting dressed: No  Using the toilet:No  Moving around from place to place: No  In the past year have you fallen or had a near fall?: No    Home Safety: Has smoke  detector and wears seat belts. Firearms are safely stored. No excess sun exposure.    Diet and Exercise  Current exercise habits: pt says good. Dietary issues discussed: pt reports a healthy diet   Depression Screen  Q1: Over the past two weeks, have you felt down, depressed or hopeless? no  Q2: Over the past two weeks, have you felt little interest or pleasure in doing things? no   The following portions of the patient's history were reviewed and updated as appropriate: allergies, current medications, past family history, past medical history, past social history, past surgical history and problem list.   Review of Systems  Denies hearing loss, and visual loss Objective:   Vision:  Sees opthalmologist Hearing: grossly normal Body mass index:  See vs page Msk: pt easily and  quickly performs "get-up-and-go" from a sitting position Cognitive Impairment Assessment: cognition, memory and judgment appear normal.  remembers 3/3 at 5 minutes.  excellent recall.  can easily read and write a sentence.  alert and oriented x 3.   Assessment:   Medicare wellness utd on preventive parameters.   Plan:   During the course of the visit the patient was educated and counseled about appropriate screening and preventive services including:        Fall prevention   Diabetes screening  Nutrition counseling   Vaccines and colonoscopy are declined.   PSA is current  Patient Instructions (the written plan) was given to the patient.

## 2014-11-30 NOTE — Progress Notes (Signed)
we discussed code status.  pt requests DNR

## 2014-12-02 LAB — HM DIABETES EYE EXAM

## 2014-12-27 ENCOUNTER — Encounter: Payer: Self-pay | Admitting: Endocrinology

## 2014-12-27 ENCOUNTER — Ambulatory Visit (INDEPENDENT_AMBULATORY_CARE_PROVIDER_SITE_OTHER): Payer: Medicare Other | Admitting: Endocrinology

## 2014-12-27 ENCOUNTER — Other Ambulatory Visit: Payer: Self-pay | Admitting: Endocrinology

## 2014-12-27 VITALS — BP 136/102 | HR 72 | Temp 97.8°F | Ht 68.0 in | Wt 206.0 lb

## 2014-12-27 DIAGNOSIS — I10 Essential (primary) hypertension: Secondary | ICD-10-CM

## 2014-12-27 DIAGNOSIS — R972 Elevated prostate specific antigen [PSA]: Secondary | ICD-10-CM

## 2014-12-27 DIAGNOSIS — K7581 Nonalcoholic steatohepatitis (NASH): Secondary | ICD-10-CM

## 2014-12-27 DIAGNOSIS — E785 Hyperlipidemia, unspecified: Secondary | ICD-10-CM

## 2014-12-27 DIAGNOSIS — Z23 Encounter for immunization: Secondary | ICD-10-CM

## 2014-12-27 DIAGNOSIS — E119 Type 2 diabetes mellitus without complications: Secondary | ICD-10-CM

## 2014-12-27 DIAGNOSIS — Z0000 Encounter for general adult medical examination without abnormal findings: Secondary | ICD-10-CM

## 2014-12-27 LAB — CBC WITH DIFFERENTIAL/PLATELET
BASOS ABS: 0.1 10*3/uL (ref 0.0–0.1)
Basophils Relative: 1 % (ref 0–1)
Eosinophils Absolute: 0.1 10*3/uL (ref 0.0–0.7)
Eosinophils Relative: 2 % (ref 0–5)
HCT: 44 % (ref 39.0–52.0)
HEMOGLOBIN: 15 g/dL (ref 13.0–17.0)
LYMPHS ABS: 1.5 10*3/uL (ref 0.7–4.0)
Lymphocytes Relative: 23 % (ref 12–46)
MCH: 30.9 pg (ref 26.0–34.0)
MCHC: 34.1 g/dL (ref 30.0–36.0)
MCV: 90.5 fL (ref 78.0–100.0)
MPV: 10.3 fL (ref 8.6–12.4)
Monocytes Absolute: 0.5 10*3/uL (ref 0.1–1.0)
Monocytes Relative: 7 % (ref 3–12)
NEUTROS ABS: 4.4 10*3/uL (ref 1.7–7.7)
Neutrophils Relative %: 67 % (ref 43–77)
PLATELETS: 213 10*3/uL (ref 150–400)
RBC: 4.86 MIL/uL (ref 4.22–5.81)
RDW: 14 % (ref 11.5–15.5)
WBC: 6.6 10*3/uL (ref 4.0–10.5)

## 2014-12-27 MED ORDER — LOSARTAN POTASSIUM-HCTZ 100-25 MG PO TABS: 1.0000 | ORAL_TABLET | Freq: Every day | ORAL | Status: DC

## 2014-12-27 MED ORDER — SITAGLIPTIN PHOSPHATE 100 MG PO TABS: 100.0000 mg | ORAL_TABLET | Freq: Every day | ORAL | Status: DC

## 2014-12-27 NOTE — Progress Notes (Signed)
Subjective:    Patient ID: [name], male    DOB: 05/10/1941, 74 y.o.   MRN: [id number]  HPI Pt is here for regular wellness examination, and is feeling pretty well in general, and says chronic med probs are stable, except as noted below Past Medical History  Diagnosis Date  . DIABETES MELLITUS, TYPE II 07/09/2007  . HYPERLIPIDEMIA 07/09/2007  . HYPERTENSION 07/09/2007  . FATTY LIVER DISEASE 01/12/2009    Past Surgical History  Procedure Laterality Date  . Stress cardiolite  08/23/2004    History   Social History  . Marital Status: Married    Spouse Name: N/A  . Number of Children: N/A  . Years of Education: N/A   Occupational History  . Retired    Social History Main Topics  . Smoking status: Never Smoker   . Smokeless tobacco: Not on file  . Alcohol Use: No  . Drug Use: No  . Sexual Activity: Yes   Other Topics Concern  . Not on file   Social History Narrative    Current Outpatient Prescriptions on File Prior to Visit  Medication Sig Dispense Refill  . aspirin 81 MG tablet Take 81 mg by mouth daily.      Marland Kitchen atorvastatin (LIPITOR) 80 MG tablet TAKE 1 TABLET EVERY DAY BY MOUTH 90 tablet 3  . fluticasone (FLONASE) 50 MCG/ACT nasal spray Place 2 sprays into both nostrils daily. 48 g 3  . metFORMIN (GLUCOPHAGE-XR) 500 MG 24 hr tablet Take 1 tablet (500 mg total) by mouth 2 (two) times daily. 90 tablet 11   No current facility-administered medications on file prior to visit.    Allergies  Allergen Reactions  . Lovastatin     REACTION: Foot pain  . Simvastatin     REACTION: Myalgias    Family History  Problem Relation Age of Onset  . Cancer Neg Hx     BP 136/102 mmHg  Pulse 72  Temp(Src) 97.8 F (36.6 C) (Oral)  Ht 5' 8"  (1.727 m)  Wt 206 lb (93.441 kg)  BMI 31.33 kg/m2  SpO2 97%  Review of Systems  Constitutional: Negative for fever.  HENT: Negative for hearing loss.   Eyes: Negative for visual disturbance.  Respiratory: Negative for shortness  of breath.   Cardiovascular: Negative for chest pain.  Gastrointestinal: Negative for anal bleeding.  Endocrine: Negative for cold intolerance.  Genitourinary: Negative for hematuria.  Musculoskeletal: Negative for back pain.  Skin: Negative for rash.  Allergic/Immunologic: Negative for environmental allergies.  Neurological: Negative for syncope and headaches.  Hematological: Does not bruise/bleed easily.  Psychiatric/Behavioral: Negative for dysphoric mood.       Objective:   Physical Exam VS: see vs page GEN: no distress HEAD: head: no deformity eyes: no periorbital swelling, no proptosis external nose and ears are normal mouth: no lesion seen NECK: supple, thyroid is not enlarged CHEST WALL: no deformity LUNGS: clear to auscultation BREASTS:  No gynecomastia CV: reg rate and rhythm, no murmur ABD: abdomen is soft, nontender.  no hepatosplenomegaly.  not distended.  Self-reducing ventral hernia.   RECTAL: normal external and internal exam.  heme neg.  PROSTATE:  Normal size.  No nodule.   MUSCULOSKELETAL: muscle bulk and strength are grossly normal.  no obvious joint swelling.  gait is normal and steady   PULSES: dorsalis pedis intact bilat.  no carotid bruit.  NEURO:  cn 2-12 grossly intact.   readily moves all 4's.  sensation is intact to touch on the  feet SKIN:  Normal texture and temperature.  No rash or suspicious lesion is visible.   NODES:  None palpable at the neck.   PSYCH: alert, well-oriented.  Does not appear anxious nor depressed.        Assessment & Plan:  Wellness visit today, with problems stable, except as noted.    SEPARATE EVALUATION FOLLOWS--EACH PROBLEM HERE IS NEW, NOT RESPONDING TO TREATMENT, OR POSES SIGNIFICANT RISK TO THE PATIENT'S HEALTH: HISTORY OF THE PRESENT ILLNESS: Pt states 1 month of swelling at the left elbow, and assoc pain.  This happend after a fall. Pt says he has myalgias on nateglinide.   PAST MEDICAL HISTORY reviewed and up  to date today REVIEW OF SYSTEMS: Denies numbness and weight change PHYSICAL EXAMINATION: VITAL SIGNS:  See vs page GENERAL: no distress Left elbow: moderate swelling at the extensor aspect.  No tend/erythema/ecchymosis/warmth LAB/XRAY RESULTS: Lab Results  Component Value Date   ALT 75* 12/27/2014   AST 41* 12/27/2014   ALKPHOS 81 12/27/2014   BILITOT 0.6 12/27/2014  IMPRESSION: NASH, persistent HTN: moderate exacerbation Bursitis, new, due to minor injury DM: pt does not tolerate nateglinide PLAN:  i have sent a prescription to your pharmacy, to change the nateglinide back to [place].   i have also sent a prescription to your pharmacy, to change losartan to losartan-HCTZ.  i told pt no rx is needed for the bursitis. Weight loss is advised.

## 2014-12-27 NOTE — Patient Instructions (Addendum)
i have sent a prescription to your pharmacy, to change the nateglinide back to [place].   i have also sent a prescription to your pharmacy, to change losartan to losartan-HCTZ.  please consider these measures for your health:  minimize alcohol.  do not use tobacco products.  have a colonoscopy at least every 10 years from age 74.  keep firearms safely stored.  always use seat belts.  have working smoke alarms in your home.  see an eye doctor and dentist regularly.  never drive under the influence of alcohol or drugs (including prescription drugs).  those with fair skin should take precautions against the sun.  blood tests are being requested for you today.  We'll let you know about the results. Please come back for a follow-up appointment in 3 months.

## 2014-12-28 LAB — URINALYSIS, ROUTINE W REFLEX MICROSCOPIC
BILIRUBIN URINE: NEGATIVE
Glucose, UA: NEGATIVE mg/dL
Hgb urine dipstick: NEGATIVE
KETONES UR: NEGATIVE mg/dL
Leukocytes, UA: NEGATIVE
Nitrite: NEGATIVE
PROTEIN: NEGATIVE mg/dL
Specific Gravity, Urine: 1.019 (ref 1.005–1.030)
UROBILINOGEN UA: 0.2 mg/dL (ref 0.0–1.0)
pH: 5 (ref 5.0–8.0)

## 2014-12-28 LAB — HEPATIC FUNCTION PANEL
ALT: 75 U/L — AB (ref 0–53)
AST: 41 U/L — AB (ref 0–37)
Albumin: 3.8 g/dL (ref 3.5–5.2)
Alkaline Phosphatase: 81 U/L (ref 39–117)
BILIRUBIN DIRECT: 0.2 mg/dL (ref 0.0–0.3)
Indirect Bilirubin: 0.4 mg/dL (ref 0.2–1.2)
TOTAL PROTEIN: 6.2 g/dL (ref 6.0–8.3)
Total Bilirubin: 0.6 mg/dL (ref 0.2–1.2)

## 2014-12-28 LAB — BASIC METABOLIC PANEL
BUN: 15 mg/dL (ref 6–23)
CHLORIDE: 104 meq/L (ref 96–112)
CO2: 26 meq/L (ref 19–32)
CREATININE: 1.11 mg/dL (ref 0.50–1.35)
Calcium: 9.1 mg/dL (ref 8.4–10.5)
Glucose, Bld: 119 mg/dL — ABNORMAL HIGH (ref 70–99)
POTASSIUM: 4.6 meq/L (ref 3.5–5.3)
Sodium: 138 mEq/L (ref 135–145)

## 2014-12-28 LAB — HEMOGLOBIN A1C
Hgb A1c MFr Bld: 6.3 % — ABNORMAL HIGH (ref <5.7)
MEAN PLASMA GLUCOSE: 134 mg/dL — AB (ref <117)

## 2014-12-28 LAB — LIPID PANEL
CHOL/HDL RATIO: 2.2 ratio
Cholesterol: 110 mg/dL (ref 0–200)
HDL: 50 mg/dL (ref >=40)
LDL CALC: 47 mg/dL (ref 0–99)
TRIGLYCERIDES: 64 mg/dL (ref <150)
VLDL: 13 mg/dL (ref 0–40)

## 2014-12-28 LAB — MICROALBUMIN / CREATININE URINE RATIO
Creatinine, Urine: 188.2 mg/dL
MICROALB/CREAT RATIO: 3.2 mg/g (ref 0.0–30.0)
Microalb, Ur: 0.6 mg/dL (ref <2.0)

## 2014-12-28 LAB — PSA: PSA: 4.48 ng/mL — ABNORMAL HIGH (ref <=4.00)

## 2014-12-28 LAB — TSH: TSH: 1.015 u[IU]/mL (ref 0.350–4.500)

## 2015-01-09 ENCOUNTER — Ambulatory Visit (INDEPENDENT_AMBULATORY_CARE_PROVIDER_SITE_OTHER): Payer: Medicare Other | Admitting: Family Medicine

## 2015-01-09 VITALS — BP 136/70 | HR 65 | Temp 97.9°F | Resp 20 | Ht 67.0 in | Wt 205.5 lb

## 2015-01-09 DIAGNOSIS — J209 Acute bronchitis, unspecified: Secondary | ICD-10-CM | POA: Diagnosis not present

## 2015-01-09 MED ORDER — LEVOFLOXACIN 500 MG PO TABS: 500.0000 mg | ORAL_TABLET | Freq: Every day | ORAL | Status: DC

## 2015-01-09 MED ORDER — HYDROCODONE-HOMATROPINE 5-1.5 MG/5ML PO SYRP: 5.0000 mL | ORAL_SOLUTION | Freq: Three times a day (TID) | ORAL | Status: DC | PRN

## 2015-01-09 MED ORDER — PREDNISONE 20 MG PO TABS
40.0000 mg | ORAL_TABLET | Freq: Every day | ORAL | Status: DC
Start: 2015-01-09 — End: 2015-03-28

## 2015-01-09 NOTE — Addendum Note (Signed)
Addended by: Robyn Haber on: 01/09/2015 12:17 PM   Modules accepted: Orders

## 2015-01-09 NOTE — Patient Instructions (Signed)

## 2015-01-09 NOTE — Progress Notes (Addendum)
° °  Subjective:    Patient ID: [name], male    DOB: Mar 30, 1941, 74 y.o.   MRN: [id number] This chart was scribed for Robyn Haber, MD by Zola Button, Medical Scribe. This patient was seen in Room 9 and the patient's care was started at 12:04 PM.   HPI HPI Comments: [name] is a 74 y.o. male who presents to the [facility] complaining of gradual onset URI symptoms that started 3 days ago. Patient reports having occasionally productive cough of phlegm and ears feeling stopped up. He notes that his wife has been diagnosed with bronchitis, although her symptoms are worse than his. The symptoms do not keep him awake at night. He has been taking cough drops for his symptoms. Patient denies fever.  Patient notes that his blood sugar levels have been doing fine.   Patient is retired.   Wt Readings from Last 3 Encounters:  01/09/15 205 lb 8 oz (93.214 kg)  12/27/14 206 lb (93.441 kg)  11/30/14 208 lb (94.348 kg)     Review of Systems  Constitutional: Negative for fever.  HENT: Positive for ear pain.   Respiratory: Positive for cough.        Objective:   Physical Exam CONSTITUTIONAL: Well developed/well nourished HEAD: Normocephalic/atraumatic EYES: EOM/PERRL ENMT: Mucous membranes moist NECK: supple no meningeal signs SPINE: entire spine nontender CV: S1/S2 noted, no murmurs/rubs/gallops noted LUNGS: Lungs are clear to auscultation bilaterally, no apparent distress ABDOMEN: soft, nontender, no rebound or guarding GU: no cva tenderness NEURO: Pt is awake/alert, moves all extremitiesx4 EXTREMITIES: pulses normal, full ROM SKIN: warm, color normal PSYCH: no abnormalities of mood noted        Assessment & Plan:   This chart was scribed in my presence and reviewed by me personally.    ICD-9-CM ICD-10-CM   1. Acute bronchitis, unspecified organism 466.0 J20.9 levofloxacin (LEVAQUIN) 500 MG tablet     predniSONE (DELTASONE) 20 MG tablet   HYDROcodone-homatropine (HYCODAN) 5-1.5 MG/5ML syrup     Signed, Robyn Haber, MD

## 2015-01-31 ENCOUNTER — Telehealth: Payer: Self-pay

## 2015-01-31 NOTE — Telephone Encounter (Signed)
Requested call back from pt to discuss flu shot.

## 2015-02-25 ENCOUNTER — Other Ambulatory Visit: Payer: Self-pay | Admitting: Endocrinology

## 2015-03-28 ENCOUNTER — Encounter: Payer: Self-pay | Admitting: Endocrinology

## 2015-03-28 ENCOUNTER — Ambulatory Visit (INDEPENDENT_AMBULATORY_CARE_PROVIDER_SITE_OTHER): Payer: Medicare Other | Admitting: Endocrinology

## 2015-03-28 VITALS — BP 118/80 | HR 61 | Temp 98.3°F | Ht 67.0 in | Wt 201.0 lb

## 2015-03-28 DIAGNOSIS — E119 Type 2 diabetes mellitus without complications: Secondary | ICD-10-CM

## 2015-03-28 DIAGNOSIS — K7581 Nonalcoholic steatohepatitis (NASH): Secondary | ICD-10-CM | POA: Diagnosis not present

## 2015-03-28 DIAGNOSIS — R972 Elevated prostate specific antigen [PSA]: Secondary | ICD-10-CM

## 2015-03-28 DIAGNOSIS — E785 Hyperlipidemia, unspecified: Secondary | ICD-10-CM

## 2015-03-28 DIAGNOSIS — I10 Essential (primary) hypertension: Secondary | ICD-10-CM

## 2015-03-28 DIAGNOSIS — Z0189 Encounter for other specified special examinations: Secondary | ICD-10-CM

## 2015-03-28 DIAGNOSIS — Z0000 Encounter for general adult medical examination without abnormal findings: Secondary | ICD-10-CM

## 2015-03-28 LAB — URINALYSIS, ROUTINE W REFLEX MICROSCOPIC
BILIRUBIN URINE: NEGATIVE
HGB URINE DIPSTICK: NEGATIVE
Ketones, ur: NEGATIVE
Leukocytes, UA: NEGATIVE
NITRITE: NEGATIVE
PH: 6 (ref 5.0–8.0)
Specific Gravity, Urine: 1.02 (ref 1.000–1.030)
Total Protein, Urine: NEGATIVE
Urine Glucose: NEGATIVE
Urobilinogen, UA: 1 (ref 0.0–1.0)

## 2015-03-28 LAB — BASIC METABOLIC PANEL
BUN: 24 mg/dL — ABNORMAL HIGH (ref 6–23)
CO2: 25 meq/L (ref 19–32)
CREATININE: 1.21 mg/dL (ref 0.40–1.50)
Calcium: 8.6 mg/dL (ref 8.4–10.5)
Chloride: 103 mEq/L (ref 96–112)
GFR: 62.49 mL/min (ref >60.00)
Glucose, Bld: 131 mg/dL — ABNORMAL HIGH (ref 70–99)
Potassium: 3.7 mEq/L (ref 3.5–5.1)
SODIUM: 137 meq/L (ref 135–145)

## 2015-03-28 LAB — CBC WITH DIFFERENTIAL/PLATELET
BASOS ABS: 0.1 10*3/uL (ref 0.0–0.1)
Basophils Relative: 1.1 % (ref 0.0–3.0)
Eosinophils Absolute: 0.3 10*3/uL (ref 0.0–0.7)
Eosinophils Relative: 4.3 % (ref 0.0–5.0)
HCT: 42.8 % (ref 39.0–52.0)
HEMOGLOBIN: 14.6 g/dL (ref 13.0–17.0)
Lymphocytes Relative: 24.8 % (ref 12.0–46.0)
Lymphs Abs: 1.7 10*3/uL (ref 0.7–4.0)
MCHC: 34.1 g/dL (ref 30.0–36.0)
MCV: 91.7 fl (ref 78.0–100.0)
Monocytes Absolute: 0.6 10*3/uL (ref 0.1–1.0)
Monocytes Relative: 9 % (ref 3.0–12.0)
NEUTROS ABS: 4.1 10*3/uL (ref 1.4–7.7)
Neutrophils Relative %: 60.8 % (ref 43.0–77.0)
PLATELETS: 215 10*3/uL (ref 150.0–400.0)
RBC: 4.67 Mil/uL (ref 4.22–5.81)
RDW: 14 % (ref 11.5–15.5)
WBC: 6.8 10*3/uL (ref 4.0–10.5)

## 2015-03-28 LAB — HEPATIC FUNCTION PANEL
ALBUMIN: 3.8 g/dL (ref 3.5–5.2)
ALK PHOS: 93 U/L (ref 39–117)
ALT: 62 U/L — ABNORMAL HIGH (ref 0–53)
AST: 32 U/L (ref 0–37)
Bilirubin, Direct: 0.2 mg/dL (ref 0.0–0.3)
Total Bilirubin: 0.6 mg/dL (ref 0.2–1.2)
Total Protein: 6.3 g/dL (ref 6.0–8.3)

## 2015-03-28 LAB — TSH: TSH: 1 u[IU]/mL (ref 0.35–4.50)

## 2015-03-28 LAB — LIPID PANEL
CHOLESTEROL: 105 mg/dL (ref 0–200)
HDL: 38.8 mg/dL — ABNORMAL LOW (ref >39.00)
LDL Cholesterol: 54 mg/dL (ref 0–99)
NONHDL: 66.2
Total CHOL/HDL Ratio: 3
Triglycerides: 60 mg/dL (ref 0.0–149.0)
VLDL: 12 mg/dL (ref 0.0–40.0)

## 2015-03-28 LAB — MICROALBUMIN / CREATININE URINE RATIO
Creatinine,U: 127.5 mg/dL
Microalb Creat Ratio: 0.5 mg/g (ref 0.0–30.0)

## 2015-03-28 LAB — HEMOGLOBIN A1C: Hgb A1c MFr Bld: 6.8 % — ABNORMAL HIGH (ref 4.6–6.5)

## 2015-03-28 LAB — PSA: PSA: 4.19 ng/mL — ABNORMAL HIGH (ref 0.10–4.00)

## 2015-03-28 NOTE — Progress Notes (Signed)
Subjective:    Patient ID: [name], male    DOB: [birth year], 74 y.o.   MRN: [id number]  HPI Pt returns for f/u of diabetes mellitus: DM type: 2 Dx'ed: 2458 Complications: none Therapy: 2 oral meds DKA: never Severe hypoglycemia: never Pancreatitis: never Other: he has never been on insulin.  Interval history: no cbg record, but states cbg's are well-controlled.  pt states he feels well in general. Past Medical History  Diagnosis Date  . DIABETES MELLITUS, TYPE II 07/09/2007  . HYPERLIPIDEMIA 07/09/2007  . HYPERTENSION 07/09/2007  . FATTY LIVER DISEASE 01/12/2009    Past Surgical History  Procedure Laterality Date  . Stress cardiolite  08/23/2004    History   Social History  . Marital Status: Married    Spouse Name: N/A  . Number of Children: N/A  . Years of Education: N/A   Occupational History  . Retired    Social History Main Topics  . Smoking status: Never Smoker   . Smokeless tobacco: Not on file  . Alcohol Use: No  . Drug Use: No  . Sexual Activity: Yes   Other Topics Concern  . Not on file   Social History Narrative    Current Outpatient Prescriptions on File Prior to Visit  Medication Sig Dispense Refill  . aspirin 81 MG tablet Take 81 mg by mouth daily.      Marland Kitchen atorvastatin (LIPITOR) 80 MG tablet TAKE ONE TABLET BY MOUTH ONCE DAILY 90 tablet 0  . fluticasone (FLONASE) 50 MCG/ACT nasal spray Place 2 sprays into both nostrils daily. 48 g 3  . losartan-hydrochlorothiazide (HYZAAR) 100-25 MG per tablet Take 1 tablet by mouth daily. 90 tablet 3  . metFORMIN (GLUCOPHAGE-XR) 500 MG 24 hr tablet Take 1 tablet (500 mg total) by mouth 2 (two) times daily. 90 tablet 11  . sitaGLIPtin (JANUVIA) 100 MG tablet Take 1 tablet (100 mg total) by mouth daily. 90 tablet 3   No current facility-administered medications on file prior to visit.    Allergies  Allergen Reactions  . Lovastatin     REACTION: Foot pain  . Simvastatin     REACTION: Myalgias     Family History  Problem Relation Age of Onset  . Cancer Neg Hx     BP 118/80 mmHg  Pulse 61  Temp(Src) 98.3 F (36.8 C) (Oral)  Ht 5' 7"  (1.702 m)  Wt 201 lb (91.173 kg)  BMI 31.47 kg/m2  SpO2 97%    Review of Systems Denies weight change.     Objective:   Physical Exam VITAL SIGNS:  See vs page GENERAL: no distress Pulses: dorsalis pedis intact bilat.   MSK: no deformity of the feet CV: trace bilat leg edema Skin:  no ulcer on the feet.  normal color and temp on the feet. Neuro: sensation is intact to touch on the feet.    Lab Results  Component Value Date   WBC 6.8 03/28/2015   HGB 14.6 03/28/2015   HCT 42.8 03/28/2015   PLT 215.0 03/28/2015   GLUCOSE 131* 03/28/2015   CHOL 105 03/28/2015   TRIG 60.0 03/28/2015   HDL 38.80* 03/28/2015   LDLCALC 54 03/28/2015   ALT 62* 03/28/2015   AST 32 03/28/2015   NA 137 03/28/2015   K 3.7 03/28/2015   CL 103 03/28/2015   CREATININE 1.21 03/28/2015   BUN 24* 03/28/2015   CO2 25 03/28/2015   TSH 1.00 03/28/2015   PSA 4.19* 03/28/2015   HGBA1C 6.8*  03/28/2015   MICROALBUR <0.7 03/28/2015       Assessment & Plan:  DM: well-controlled Elevated psa, stable: we'll follow Dyslipidemia: well-controlled: Please continue the same medication.     Patient is advised the following: Patient Instructions  blood tests are requested for you today.  We'll let you know about the results. Please come back for a follow-up appointment in 6 months.   check your blood sugar once a day.  vary the time of day when you check, between before the 3 meals, and at bedtime.  also check if you have symptoms of your blood sugar being too high or too low.  please keep a record of the readings and bring it to your next appointment here.  You can write it on any piece of paper.  please call us sooner if your blood sugar goes below 70, or if you have a lot of readings over 200.

## 2015-03-28 NOTE — Patient Instructions (Addendum)
blood tests are requested for you today.  We'll let you know about the results. Please come back for a follow-up appointment in 6 months.   check your blood sugar once a day.  vary the time of day when you check, between before the 3 meals, and at bedtime.  also check if you have symptoms of your blood sugar being too high or too low.  please keep a record of the readings and bring it to your next appointment here.  You can write it on any piece of paper.  please call us sooner if your blood sugar goes below 70, or if you have a lot of readings over 200.

## 2015-05-30 ENCOUNTER — Other Ambulatory Visit: Payer: Self-pay | Admitting: Endocrinology

## 2015-08-29 ENCOUNTER — Other Ambulatory Visit: Payer: Self-pay | Admitting: Endocrinology

## 2015-12-26 ENCOUNTER — Encounter: Payer: Self-pay | Admitting: Endocrinology

## 2015-12-26 ENCOUNTER — Other Ambulatory Visit: Payer: Self-pay

## 2015-12-26 ENCOUNTER — Ambulatory Visit (INDEPENDENT_AMBULATORY_CARE_PROVIDER_SITE_OTHER): Payer: Medicare Other | Admitting: Endocrinology

## 2015-12-26 VITALS — BP 128/87 | HR 70 | Temp 97.7°F | Ht 67.0 in | Wt 202.0 lb

## 2015-12-26 DIAGNOSIS — E119 Type 2 diabetes mellitus without complications: Secondary | ICD-10-CM

## 2015-12-26 LAB — POCT GLYCOSYLATED HEMOGLOBIN (HGB A1C): Hemoglobin A1C: 6.5

## 2015-12-26 MED ORDER — SITAGLIPTIN PHOSPHATE 100 MG PO TABS: 50.0000 mg | ORAL_TABLET | Freq: Every day | ORAL | Status: DC

## 2015-12-26 MED ORDER — ATORVASTATIN CALCIUM 80 MG PO TABS: 80.0000 mg | ORAL_TABLET | Freq: Every day | ORAL | Status: DC

## 2015-12-26 NOTE — Patient Instructions (Addendum)
Please come back for a regular physical appointment in 2 months.   Please reduce the januvia to 1/2 pill per day.  i have sent a prescription to your pharmacy check your blood sugar once a day.  vary the time of day when you check, between before the 3 meals, and at bedtime.  also check if you have symptoms of your blood sugar being too high or too low.  please keep a record of the readings and bring it to your next appointment here.  You can write it on any piece of paper.  please call us sooner if your blood sugar goes below 70, or if you have a lot of readings over 200.

## 2015-12-26 NOTE — Progress Notes (Signed)
Subjective:    Patient ID: [name], male    DOB: 08-01-1941, 76 y.o.   MRN: [id number]  HPI Pt returns for f/u of diabetes mellitus: DM type: 2 Dx'ed: 0347 Complications: none Therapy: 2 oral meds DKA: never Severe hypoglycemia: never Pancreatitis: never Other: he has never been on insulin.  Interval history: no cbg record, but states cbg's are well-controlled.  pt states he feels well in general, but he can no longer afford [place].   Past Medical History  Diagnosis Date  . DIABETES MELLITUS, TYPE II 07/09/2007  . HYPERLIPIDEMIA 07/09/2007  . HYPERTENSION 07/09/2007  . FATTY LIVER DISEASE 01/12/2009    Past Surgical History  Procedure Laterality Date  . Stress cardiolite  08/23/2004    Social History   Social History  . Marital Status: Married    Spouse Name: N/A  . Number of Children: N/A  . Years of Education: N/A   Occupational History  . Retired    Social History Main Topics  . Smoking status: Never Smoker   . Smokeless tobacco: Not on file  . Alcohol Use: No  . Drug Use: No  . Sexual Activity: Yes   Other Topics Concern  . Not on file   Social History Narrative    Current Outpatient Prescriptions on File Prior to Visit  Medication Sig Dispense Refill  . aspirin 81 MG tablet Take 81 mg by mouth daily.      . fluticasone (FLONASE) 50 MCG/ACT nasal spray Place 2 sprays into both nostrils daily. 48 g 3  . losartan-hydrochlorothiazide (HYZAAR) 100-25 MG per tablet Take 1 tablet by mouth daily. 90 tablet 3  . metFORMIN (GLUCOPHAGE-XR) 500 MG 24 hr tablet Take 1 tablet (500 mg total) by mouth 2 (two) times daily. 90 tablet 11   No current facility-administered medications on file prior to visit.    Allergies  Allergen Reactions  . Lovastatin     REACTION: Foot pain  . Simvastatin     REACTION: Myalgias    Family History  Problem Relation Age of Onset  . Cancer Neg Hx     BP 128/87 mmHg  Pulse 70  Temp(Src) 97.7 F (36.5 C) (Oral)  Ht 5'  7" (1.702 m)  Wt 202 lb (91.627 kg)  BMI 31.63 kg/m2  SpO2 97%    Review of Systems He denies hypoglycemia    Objective:   Physical Exam VITAL SIGNS:  See vs page GENERAL: no distress Pulses: dorsalis pedis intact bilat.   MSK: no deformity of the feet CV: trace bilat leg edema, and bilat varicosities.   Skin:  no ulcer on the feet.  normal color and temp on the feet. Neuro: sensation is intact to touch on the feet Ext: There is bilateral onychomycosis of the toenails   A1c=6.5%     Assessment & Plan:  DM: well-controlled.  He says he cannot afford [place], but he is doing well on it.    Patient is advised the following: Patient Instructions  Please come back for a regular physical appointment in 2 months.   Please reduce the januvia to 1/2 pill per day.  i have sent a prescription to your pharmacy check your blood sugar once a day.  vary the time of day when you check, between before the 3 meals, and at bedtime.  also check if you have symptoms of your blood sugar being too high or too low.  please keep a record of the readings and bring it to  your next appointment here.  You can write it on any piece of paper.  please call us sooner if your blood sugar goes below 70, or if you have a lot of readings over 200.

## 2015-12-29 ENCOUNTER — Telehealth: Payer: Self-pay | Admitting: Endocrinology

## 2015-12-29 NOTE — Telephone Encounter (Signed)
Pt would like 30 day supply with one year refills and please make sure it does not say to cut the pill in half or they will charge too much for the rx

## 2015-12-29 NOTE — Telephone Encounter (Signed)
Pt is requesting that we change the rx for the januvia 3 month supply and not put that he will be cutting them because they are now charging him double.

## 2015-12-30 MED ORDER — SITAGLIPTIN PHOSPHATE 100 MG PO TABS: 50.0000 mg | ORAL_TABLET | Freq: Every day | ORAL | Status: DC

## 2015-12-30 NOTE — Telephone Encounter (Signed)
Ok, i have sent a prescription to your pharmacy  

## 2015-12-30 NOTE — Addendum Note (Signed)
Addended by: Renato Shin on: 12/30/2015 01:43 PM   Modules accepted: Orders

## 2015-12-30 NOTE — Telephone Encounter (Signed)
VM left advising Rx faxed.   KP

## 2015-12-30 NOTE — Telephone Encounter (Signed)
The patient is requesting that we change the directions on the Januvia and to send in a 30 day supply with 11 refills. If he is taking half daily I can send 15 with 11 refills. Pease advise.    KP

## 2016-01-03 ENCOUNTER — Telehealth: Payer: Self-pay | Admitting: Endocrinology

## 2016-01-03 MED ORDER — SITAGLIPTIN PHOSPHATE 100 MG PO TABS: 50.0000 mg | ORAL_TABLET | Freq: Every day | ORAL | Status: DC

## 2016-01-03 NOTE — Telephone Encounter (Signed)
Pt arrived to see Dr. Loanne Drilling. Dr. Loanne Drilling spoke with pt.

## 2016-01-03 NOTE — Telephone Encounter (Signed)
i have sent a prescription to your pharmacy 

## 2016-01-06 ENCOUNTER — Telehealth: Payer: Self-pay | Admitting: Endocrinology

## 2016-01-06 MED ORDER — SITAGLIPTIN PHOSPHATE 100 MG PO TABS: 50.0000 mg | ORAL_TABLET | Freq: Every day | ORAL | Status: DC

## 2016-01-06 NOTE — Telephone Encounter (Signed)
Rx submitted

## 2016-01-06 NOTE — Telephone Encounter (Signed)
Pt calling asking for a 30 tablet rx with the directions to be to take one tab daily. MD wants pt to only take 50 mg not 100 so that would be too much medicine. Informed the pt that a 30 day supply is 15 tab cut in half. He does not agree with that. Will discuss the Dr. Loanne Drilling

## 2016-01-06 NOTE — Telephone Encounter (Signed)
Explained cost to the pt--he would like [place] to call in 15 tabs of the 100 mg please

## 2016-01-06 NOTE — Telephone Encounter (Signed)
We are bound by patient safety rules. This means that the directions must be what pt is actually taking

## 2016-01-17 ENCOUNTER — Telehealth: Payer: Self-pay | Admitting: Endocrinology

## 2016-01-17 ENCOUNTER — Other Ambulatory Visit: Payer: Self-pay | Admitting: *Deleted

## 2016-01-17 MED ORDER — METFORMIN HCL ER 500 MG PO TB24: 500.0000 mg | ORAL_TABLET | Freq: Two times a day (BID) | ORAL | Status: DC

## 2016-01-17 MED ORDER — LOSARTAN POTASSIUM-HCTZ 100-25 MG PO TABS: 1.0000 | ORAL_TABLET | Freq: Every day | ORAL | Status: DC

## 2016-01-17 NOTE — Telephone Encounter (Signed)
Refills completed.

## 2016-01-17 NOTE — Telephone Encounter (Signed)
Pt notified Rx's sent to pharmacy.

## 2016-01-17 NOTE — Telephone Encounter (Signed)
Pt needs [place] to call in hyzaar and metformin to [facility] please

## 2016-02-23 ENCOUNTER — Encounter: Payer: Self-pay | Admitting: Endocrinology

## 2016-02-23 ENCOUNTER — Ambulatory Visit (INDEPENDENT_AMBULATORY_CARE_PROVIDER_SITE_OTHER): Payer: Medicare Other | Admitting: Endocrinology

## 2016-02-23 VITALS — BP 134/80 | HR 83 | Temp 97.8°F | Ht 67.0 in | Wt 202.0 lb

## 2016-02-23 DIAGNOSIS — Z0000 Encounter for general adult medical examination without abnormal findings: Secondary | ICD-10-CM

## 2016-02-23 DIAGNOSIS — Z23 Encounter for immunization: Secondary | ICD-10-CM | POA: Diagnosis not present

## 2016-02-23 DIAGNOSIS — E119 Type 2 diabetes mellitus without complications: Secondary | ICD-10-CM | POA: Diagnosis not present

## 2016-02-23 LAB — POCT GLYCOSYLATED HEMOGLOBIN (HGB A1C): Hemoglobin A1C: 6.6

## 2016-02-23 NOTE — Progress Notes (Signed)
we discussed code status.  pt requests full code, but would not want to be started or maintained on artificial life-support measures if there was not a reasonable chance of recovery 

## 2016-02-23 NOTE — Patient Instructions (Signed)
please consider these measures for your health:  minimize alcohol.  do not use tobacco products.  have a colonoscopy at least every 10 years from age 75.  keep firearms safely stored.  always use seat belts.  have working smoke alarms in your home.  see an eye doctor and dentist regularly.  never drive under the influence of alcohol or drugs (including prescription drugs).  those with fair skin should take precautions against the sun. it is critically important to prevent falling down (keep floor areas well-lit, dry, and free of loose objects.  If you have a cane, walker, or wheelchair, you should use it, even for short trips around the house.  Wear flat-soled shoes.  Also, try not to rush) good diet and exercise significantly improve the control of your diabetes.  please let me know if you wish to be referred to a dietician.  high blood sugar is very risky to your health.  you should see an eye doctor and dentist every year.  It is very important to get all recommended vaccinations.  Please come back for a regular physical appointment in 3 months.

## 2016-02-23 NOTE — Progress Notes (Signed)
Subjective:    Patient ID: [name], male    DOB: 1940/12/23, 75 y.o.   MRN: [id number]  HPI Pt returns for f/u of diabetes mellitus: DM type: Insulin-requiring type 2. Dx'ed: 4854 Complications: nephropathy, and CVA. Therapy: 2 oral meds DKA: never. Severe hypoglycemia: never.  Pancreatitis: never.   Other: he has never taken insulin.  Interval history:  no cbg record, but states cbg's are well-controlled.  pt states he feels well in general.   Past Medical History  Diagnosis Date  . DIABETES MELLITUS, TYPE II 07/09/2007  . HYPERLIPIDEMIA 07/09/2007  . HYPERTENSION 07/09/2007  . FATTY LIVER DISEASE 01/12/2009    Past Surgical History  Procedure Laterality Date  . Stress cardiolite  08/23/2004    Social History   Social History  . Marital Status: Married    Spouse Name: N/A  . Number of Children: N/A  . Years of Education: N/A   Occupational History  . Retired    Social History Main Topics  . Smoking status: Never Smoker   . Smokeless tobacco: Not on file  . Alcohol Use: No  . Drug Use: No  . Sexual Activity: Yes   Other Topics Concern  . Not on file   Social History Narrative    Current Outpatient Prescriptions on File Prior to Visit  Medication Sig Dispense Refill  . aspirin 81 MG tablet Take 81 mg by mouth daily.      Marland Kitchen atorvastatin (LIPITOR) 80 MG tablet Take 1 tablet (80 mg total) by mouth daily. 90 tablet 3  . fluticasone (FLONASE) 50 MCG/ACT nasal spray Place 2 sprays into both nostrils daily. (Patient taking differently: Place 2 sprays into both nostrils as needed. ) 48 g 3  . losartan-hydrochlorothiazide (HYZAAR) 100-25 MG tablet Take 1 tablet by mouth daily. 90 tablet 1  . metFORMIN (GLUCOPHAGE-XR) 500 MG 24 hr tablet Take 1 tablet (500 mg total) by mouth 2 (two) times daily. 180 tablet 1  . sitaGLIPtin (JANUVIA) 100 MG tablet Take 0.5 tablets (50 mg total) by mouth daily. 15 tablet 5   No current facility-administered medications on file prior  to visit.    Allergies  Allergen Reactions  . Lovastatin     REACTION: Foot pain  . Simvastatin     REACTION: Myalgias    Family History  Problem Relation Age of Onset  . Cancer Neg Hx     BP 134/80 mmHg  Pulse 83  Temp(Src) 97.8 F (36.6 C) (Oral)  Ht 5' 7"  (1.702 m)  Wt 202 lb (91.627 kg)  BMI 31.63 kg/m2  SpO2 97%  Review of Systems He denies hypoglycemia.     Objective:   Physical Exam VITAL SIGNS:  See vs page GENERAL: no distress Pulses: dorsalis pedis intact bilat.   MSK: no deformity of the feet CV: trace bilat leg edema Skin:  no ulcer on the feet.  normal color and temp on the feet. Neuro: sensation is intact to touch on the feet.    A1c=6.6%    Assessment & Plan:  DM: well-controlled.  Please continue the same medication.   Subjective:   Patient here for Medicare annual wellness visit and management of other chronic and acute problems.     Risk factors: advanced age    43 of Physicians Providing Medical Care to Patient:  See "snapshot"   Activities of Daily Living: In your present state of health, do you have any difficulty performing the following activities?:  Preparing food and eating?:  No  Bathing yourself: No  Getting dressed: No  Using the toilet:No  Moving around from place to place: No  In the past year have you fallen or had a near fall?: No    Home Safety: Has smoke detector and wears seat belts. Firearms are safely stored. No excess sun exposure.   Diet and Exercise  Current exercise habits: pt says good  Dietary issues discussed: pt reports a healthy diet   Depression Screen  Q1: Over the past two weeks, have you felt down, depressed or hopeless? no  Q2: Over the past two weeks, have you felt little interest or pleasure in doing things? no   The following portions of the patient's history were reviewed and updated as appropriate: allergies, current medications, past family history, past medical history, past social  history, past surgical history and problem list.   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   Cognitive Impairment Assessment: cognition, memory and judgment appear normal.  remembers 3/3 at 5 minutes.  excellent recall.  can easily read and write a sentence.  alert and oriented x 3   Assessment:   Medicare wellness utd on preventive parameters    Plan:   During the course of the visit the patient was educated and counseled about appropriate screening and preventive services including:        Fall prevention   Diabetes screening  Nutrition counseling   Vaccines / LABS Zostavax / Pneumococcal Vaccine  today  PSA is up to date  Patient Instructions (the written plan) was given to the patient.

## 2016-03-12 ENCOUNTER — Other Ambulatory Visit: Payer: Self-pay

## 2016-03-12 MED ORDER — METFORMIN HCL ER 500 MG PO TB24: 500.0000 mg | ORAL_TABLET | Freq: Two times a day (BID) | ORAL | Status: DC

## 2016-03-12 MED ORDER — LOSARTAN POTASSIUM-HCTZ 100-25 MG PO TABS: 1.0000 | ORAL_TABLET | Freq: Every day | ORAL | Status: DC

## 2016-03-12 MED ORDER — SITAGLIPTIN PHOSPHATE 100 MG PO TABS: 50.0000 mg | ORAL_TABLET | Freq: Every day | ORAL | Status: DC

## 2016-03-12 MED ORDER — ATORVASTATIN CALCIUM 80 MG PO TABS: 80.0000 mg | ORAL_TABLET | Freq: Every day | ORAL | Status: DC

## 2016-05-24 ENCOUNTER — Encounter: Payer: Self-pay | Admitting: Endocrinology

## 2016-05-24 ENCOUNTER — Ambulatory Visit (INDEPENDENT_AMBULATORY_CARE_PROVIDER_SITE_OTHER): Payer: Medicare Other | Admitting: Endocrinology

## 2016-05-24 VITALS — BP 130/76 | HR 68 | Temp 97.8°F | Wt 202.0 lb

## 2016-05-24 DIAGNOSIS — E785 Hyperlipidemia, unspecified: Secondary | ICD-10-CM | POA: Diagnosis not present

## 2016-05-24 DIAGNOSIS — Z0000 Encounter for general adult medical examination without abnormal findings: Secondary | ICD-10-CM | POA: Diagnosis not present

## 2016-05-24 DIAGNOSIS — K7581 Nonalcoholic steatohepatitis (NASH): Secondary | ICD-10-CM | POA: Diagnosis not present

## 2016-05-24 DIAGNOSIS — E119 Type 2 diabetes mellitus without complications: Secondary | ICD-10-CM | POA: Diagnosis not present

## 2016-05-24 DIAGNOSIS — I10 Essential (primary) hypertension: Secondary | ICD-10-CM | POA: Diagnosis not present

## 2016-05-24 DIAGNOSIS — R972 Elevated prostate specific antigen [PSA]: Secondary | ICD-10-CM | POA: Diagnosis not present

## 2016-05-24 LAB — URINALYSIS, ROUTINE W REFLEX MICROSCOPIC
Bilirubin Urine: NEGATIVE
HGB URINE DIPSTICK: NEGATIVE
Ketones, ur: NEGATIVE
Leukocytes, UA: NEGATIVE
NITRITE: NEGATIVE
SPECIFIC GRAVITY, URINE: 1.02 (ref 1.000–1.030)
TOTAL PROTEIN, URINE-UPE24: NEGATIVE
Urine Glucose: NEGATIVE
Urobilinogen, UA: 0.2 (ref 0.0–1.0)
pH: 6 (ref 5.0–8.0)

## 2016-05-24 LAB — BASIC METABOLIC PANEL
BUN: 28 mg/dL — ABNORMAL HIGH (ref 6–23)
CO2: 27 meq/L (ref 19–32)
Calcium: 9.3 mg/dL (ref 8.4–10.5)
Chloride: 101 mEq/L (ref 96–112)
Creatinine, Ser: 1.45 mg/dL (ref 0.40–1.50)
GFR: 50.55 mL/min — ABNORMAL LOW (ref >60.00)
GLUCOSE: 122 mg/dL — AB (ref 70–99)
POTASSIUM: 3.8 meq/L (ref 3.5–5.1)
SODIUM: 139 meq/L (ref 135–145)

## 2016-05-24 LAB — TSH: TSH: 1.45 u[IU]/mL (ref 0.35–4.50)

## 2016-05-24 LAB — MICROALBUMIN / CREATININE URINE RATIO
Creatinine,U: 189 mg/dL
Microalb Creat Ratio: 0.4 mg/g (ref 0.0–30.0)
Microalb, Ur: <0.7 mg/dL (ref 0.0–1.9)

## 2016-05-24 LAB — CBC WITH DIFFERENTIAL/PLATELET
BASOS PCT: 0.3 % (ref 0.0–3.0)
Basophils Absolute: 0 10*3/uL (ref 0.0–0.1)
EOS PCT: 2.3 % (ref 0.0–5.0)
Eosinophils Absolute: 0.2 10*3/uL (ref 0.0–0.7)
HCT: 45 % (ref 39.0–52.0)
Hemoglobin: 14.9 g/dL (ref 13.0–17.0)
LYMPHS ABS: 2 10*3/uL (ref 0.7–4.0)
Lymphocytes Relative: 19.1 % (ref 12.0–46.0)
MCHC: 33.2 g/dL (ref 30.0–36.0)
MCV: 92.6 fl (ref 78.0–100.0)
MONO ABS: 1 10*3/uL (ref 0.1–1.0)
MONOS PCT: 9.1 % (ref 3.0–12.0)
NEUTROS ABS: 7.2 10*3/uL (ref 1.4–7.7)
NEUTROS PCT: 69.2 % (ref 43.0–77.0)
Platelets: 226 10*3/uL (ref 150.0–400.0)
RBC: 4.86 Mil/uL (ref 4.22–5.81)
RDW: 13.1 % (ref 11.5–15.5)
WBC: 10.4 10*3/uL (ref 4.0–10.5)

## 2016-05-24 LAB — LIPID PANEL
CHOLESTEROL: 128 mg/dL (ref 0–200)
HDL: 40.1 mg/dL (ref >39.00)
LDL CALC: 66 mg/dL (ref 0–99)
NonHDL: 87.9
Total CHOL/HDL Ratio: 3
Triglycerides: 108 mg/dL (ref 0.0–149.0)
VLDL: 21.6 mg/dL (ref 0.0–40.0)

## 2016-05-24 LAB — POCT GLYCOSYLATED HEMOGLOBIN (HGB A1C): Hemoglobin A1C: 6.7

## 2016-05-24 LAB — HEPATIC FUNCTION PANEL
ALBUMIN: 3.9 g/dL (ref 3.5–5.2)
ALK PHOS: 78 U/L (ref 39–117)
ALT: 84 U/L — ABNORMAL HIGH (ref 0–53)
AST: 44 U/L — AB (ref 0–37)
BILIRUBIN DIRECT: 0.1 mg/dL (ref 0.0–0.3)
BILIRUBIN TOTAL: 0.6 mg/dL (ref 0.2–1.2)
Total Protein: 6.6 g/dL (ref 6.0–8.3)

## 2016-05-24 LAB — PSA: PSA: 5.78 ng/mL — ABNORMAL HIGH (ref 0.10–4.00)

## 2016-05-24 NOTE — Progress Notes (Signed)
Subjective:    Patient ID: [name], male    DOB: 1941/05/08, 75 y.o.   MRN: [id number]  HPI Pt is here for regular wellness examination, and is feeling pretty well in general, and says chronic med probs are stable, except as noted below Past Medical History  Diagnosis Date  . DIABETES MELLITUS, TYPE II 07/09/2007  . HYPERLIPIDEMIA 07/09/2007  . HYPERTENSION 07/09/2007  . FATTY LIVER DISEASE 01/12/2009    Past Surgical History  Procedure Laterality Date  . Stress cardiolite  08/23/2004    Social History   Social History  . Marital Status: Married    Spouse Name: N/A  . Number of Children: N/A  . Years of Education: N/A   Occupational History  . Retired    Social History Main Topics  . Smoking status: Never Smoker   . Smokeless tobacco: Not on file  . Alcohol Use: No  . Drug Use: No  . Sexual Activity: Yes   Other Topics Concern  . Not on file   Social History Narrative    Current Outpatient Prescriptions on File Prior to Visit  Medication Sig Dispense Refill  . aspirin 81 MG tablet Take 81 mg by mouth daily.      Marland Kitchen atorvastatin (LIPITOR) 80 MG tablet Take 1 tablet (80 mg total) by mouth daily. 90 tablet 3  . fluticasone (FLONASE) 50 MCG/ACT nasal spray Place 2 sprays into both nostrils daily. (Patient taking differently: Place 2 sprays into both nostrils as needed. ) 48 g 3  . losartan-hydrochlorothiazide (HYZAAR) 100-25 MG tablet Take 1 tablet by mouth daily. 90 tablet 1  . metFORMIN (GLUCOPHAGE-XR) 500 MG 24 hr tablet Take 1 tablet (500 mg total) by mouth 2 (two) times daily. 180 tablet 1  . sitaGLIPtin (JANUVIA) 100 MG tablet Take 0.5 tablets (50 mg total) by mouth daily. 45 tablet 1   No current facility-administered medications on file prior to visit.    Allergies  Allergen Reactions  . Lovastatin     REACTION: Foot pain  . Simvastatin     REACTION: Myalgias    Family History  Problem Relation Age of Onset  . Cancer Neg Hx     BP 130/76 mmHg   Pulse 68  Temp(Src) 97.8 F (36.6 C) (Oral)  Wt 202 lb (91.627 kg)   Review of Systems  Constitutional: Negative for fever.  HENT: Negative for hearing loss.   Eyes: Negative for visual disturbance.  Respiratory: Negative for shortness of breath.   Cardiovascular: Negative for chest pain.  Gastrointestinal: Negative for blood in stool.  Endocrine: Negative for cold intolerance.  Musculoskeletal: Negative for back pain.  Skin: Negative for rash.  Allergic/Immunologic: Positive for environmental allergies.  Neurological: Negative for syncope.  Hematological: Bruises/bleeds easily.  Psychiatric/Behavioral: Negative for dysphoric mood.       Objective:   Physical Exam VS: see vs page GEN: no distress HEAD: head: no deformity eyes: no periorbital swelling, no proptosis external nose and ears are normal mouth: no lesion seen NECK: supple, thyroid is not enlarged CHEST WALL: no deformity LUNGS: clear to auscultation BREASTS:  bilat pseudogynecomastia CV: reg rate and rhythm, no murmur ABD: abdomen is soft, nontender.  no hepatosplenomegaly.  not distended.  no hernia.  MUSCULOSKELETAL: muscle bulk and strength are grossly normal.  no obvious joint swelling.  gait is normal and steady EXTEMITIES: no deformity.  no ulcer on the feet.  feet are of normal color and temp.  1+ bilat leg edema.  There is bilateral onychomycosis of the toenails PULSES: dorsalis pedis intact bilat.  no carotid bruit NEURO:  cn 2-12 grossly intact.   readily moves all 4's.  sensation is intact to touch on the feet SKIN:  Normal texture and temperature.  No rash or suspicious lesion is visible.   NODES:  None palpable at the neck PSYCH: alert, well-oriented.  Does not appear anxious nor depressed.   i personally reviewed electrocardiogram tracing (today): Indication: DM Impression: several mild nonspecific findings.    Assessment & Plan:  Wellness visit today, with problems stable, except as  noted.   SEPARATE EVALUATION FOLLOWS--EACH PROBLEM HERE IS NEW, NOT RESPONDING TO TREATMENT, OR POSES SIGNIFICANT RISK TO THE PATIENT'S HEALTH: HISTORY OF THE PRESENT ILLNESS: elev psa is again noted today.  Denies decreased urinary stream Hepatic transaminases are worse.  No weight change PAST MEDICAL HISTORY reviewed and up to date today REVIEW OF SYSTEMS: Denies hematuria. PHYSICAL EXAMINATION: VITAL SIGNS:  See vs page GENERAL: no distress RECTAL: normal external and internal exam.  heme neg.  PROSTATE:  Normal size.  No nodule LAB/XRAY RESULTS: Lab Results  Component Value Date   ALT 84* 05/24/2016   AST 44* 05/24/2016   ALKPHOS 78 05/24/2016   BILITOT 0.6 05/24/2016   Lab Results  Component Value Date   PSA 5.78* 05/24/2016   PSA 4.19* 03/28/2015   PSA 4.48* 12/27/2014  IMPRESSION: elev PSA, worse NASH, worse PLAN:  Ref back to urol Weight loss is advised

## 2016-05-24 NOTE — Patient Instructions (Signed)
blood tests are requested for you today.  We'll let you know about the results. Please consider these measures for your health:  minimize alcohol.  Do not use tobacco products.  Have a colonoscopy at least every 10 years from age 74.  Women should have an annual mammogram from age 85.  Keep firearms safely stored.  Always use seat belts.  have working smoke alarms in your home.  See an eye doctor and dentist regularly.  Never drive under the influence of alcohol or drugs (including prescription drugs).  Those with fair skin should take precautions against the sun, and should carefully examine their skin once per month, for any new or changed moles. It is critically important to prevent falling down (keep floor areas well-lit, dry, and free of loose objects.  If you have a cane, walker, or wheelchair, you should use it, even for short trips around the house.  Wear flat-soled shoes.  Also, try not to rush) Please come back for a follow-up appointment in 4 months.

## 2016-05-30 ENCOUNTER — Telehealth: Payer: Self-pay | Admitting: Endocrinology

## 2016-05-30 NOTE — Telephone Encounter (Signed)
PT requesting call back about outgoing referral.  He stated he was told to speak with the person who handles these.

## 2016-05-31 NOTE — Telephone Encounter (Signed)
PT called back again about this referral, states that he needs this done or he will be coming up to the office.

## 2016-06-01 NOTE — Telephone Encounter (Signed)
Who's the referral coordinator?

## 2016-06-25 ENCOUNTER — Other Ambulatory Visit: Payer: Self-pay | Admitting: Endocrinology

## 2016-07-30 DIAGNOSIS — N400 Enlarged prostate without lower urinary tract symptoms: Secondary | ICD-10-CM | POA: Diagnosis not present

## 2016-07-30 DIAGNOSIS — R972 Elevated prostate specific antigen [PSA]: Secondary | ICD-10-CM | POA: Diagnosis not present

## 2016-09-10 ENCOUNTER — Other Ambulatory Visit: Payer: Self-pay | Admitting: Endocrinology

## 2016-09-23 NOTE — Progress Notes (Signed)
Subjective:    Patient ID: [name], male    DOB: 08/02/1941, 75 y.o.   MRN: [id number]  HPI Pt returns for f/u of diabetes mellitus: DM type: Insulin-requiring type 2. Dx'ed: 1224 Complications: nephropathy, and CVA. Therapy: 2 oral meds DKA: never. Severe hypoglycemia: never.  Pancreatitis: never.   Other: he has never taken insulin; metformin-XR dosage is limited by diarrhea.  Interval history:  no cbg record, but states cbg's are well-controlled.  pt states he feels well in general.   Past Medical History:  Diagnosis Date  . DIABETES MELLITUS, TYPE II 07/09/2007  . FATTY LIVER DISEASE 01/12/2009  . HYPERLIPIDEMIA 07/09/2007  . HYPERTENSION 07/09/2007    Past Surgical History:  Procedure Laterality Date  . Stress Cardiolite  08/23/2004    Social History   Social History  . Marital status: Married    Spouse name: N/A  . Number of children: N/A  . Years of education: N/A   Occupational History  . Retired    Social History Main Topics  . Smoking status: Never Smoker  . Smokeless tobacco: Not on file  . Alcohol use No  . Drug use: No  . Sexual activity: Yes   Other Topics Concern  . Not on file   Social History Narrative  . No narrative on file    Current Outpatient Prescriptions on File Prior to Visit  Medication Sig Dispense Refill  . aspirin 81 MG tablet Take 81 mg by mouth daily.      Marland Kitchen atorvastatin (LIPITOR) 80 MG tablet Take 1 tablet (80 mg total) by mouth daily. 90 tablet 3  . fluticasone (FLONASE) 50 MCG/ACT nasal spray Place 2 sprays into both nostrils daily. (Patient taking differently: Place 2 sprays into both nostrils as needed. ) 48 g 3  . JANUVIA 100 MG tablet Take one-half tablet by  mouth daily 45 tablet 0  . losartan-hydrochlorothiazide (HYZAAR) 100-25 MG tablet TAKE 1 TABLET BY MOUTH  DAILY 90 tablet 3  . metFORMIN (GLUCOPHAGE-XR) 500 MG 24 hr tablet TAKE 1 TABLET BY MOUTH TWO  TIMES DAILY 180 tablet 3   No current facility-administered  medications on file prior to visit.     Allergies  Allergen Reactions  . Lovastatin     REACTION: Foot pain  . Simvastatin     REACTION: Myalgias   Family History  Problem Relation Age of Onset  . Cancer Neg Hx    BP 124/62   Pulse 66   Ht 5' 7"  (1.702 m)   Wt 206 lb (93.4 kg)   SpO2 96%   BMI 32.26 kg/m   Review of Systems He denies hypoglycemia    Objective:   Physical Exam VITAL SIGNS:  See vs page GENERAL: no distress Pulses: dorsalis pedis intact bilat.   MSK: no deformity of the feet CV: trace bilat leg edema Skin:  no ulcer on the feet.  normal color and temp on the feet.  Neuro: sensation is intact to touch on the feet. Ext: There is bilateral onychomycosis of the toenails.    A1c=6.8%    Assessment & Plan:  Insulin-requiring type 2 DM, with CVA, well-controlled. Patient is advised the following: Patient Instructions  check your blood sugar 4 times a day.  vary the time of day when you check, between before the 3 meals, and at bedtime.  also check if you have symptoms of your blood sugar being too high or too low.  please keep a record of the readings  and bring it to your next appointment here (or you can bring the meter itself).  You can write it on any piece of paper.  please call us sooner if your blood sugar goes below 70, or if you have a lot of readings over 200. Please continue the same medications.   Please come back for a follow-up appointment in 4 months.

## 2016-09-24 ENCOUNTER — Encounter: Payer: Self-pay | Admitting: Endocrinology

## 2016-09-24 ENCOUNTER — Ambulatory Visit (INDEPENDENT_AMBULATORY_CARE_PROVIDER_SITE_OTHER): Payer: Medicare Other | Admitting: Endocrinology

## 2016-09-24 VITALS — BP 124/62 | HR 66 | Ht 67.0 in | Wt 206.0 lb

## 2016-09-24 DIAGNOSIS — E119 Type 2 diabetes mellitus without complications: Secondary | ICD-10-CM

## 2016-09-24 LAB — POCT GLYCOSYLATED HEMOGLOBIN (HGB A1C): Hemoglobin A1C: 6.8

## 2016-09-24 NOTE — Addendum Note (Signed)
Addended by: Verlin Grills T on: 09/24/2016 11:06 AM   Modules accepted: Orders

## 2016-09-24 NOTE — Patient Instructions (Addendum)
check your blood sugar 4 times a day.  vary the time of day when you check, between before the 3 meals, and at bedtime.  also check if you have symptoms of your blood sugar being too high or too low.  please keep a record of the readings and bring it to your next appointment here (or you can bring the meter itself).  You can write it on any piece of paper.  please call us sooner if your blood sugar goes below 70, or if you have a lot of readings over 200. Please continue the same medications.   Please come back for a follow-up appointment in 4 months.

## 2016-10-31 ENCOUNTER — Other Ambulatory Visit: Payer: Self-pay | Admitting: Endocrinology

## 2017-01-15 DIAGNOSIS — R972 Elevated prostate specific antigen [PSA]: Secondary | ICD-10-CM | POA: Diagnosis not present

## 2017-01-21 DIAGNOSIS — R972 Elevated prostate specific antigen [PSA]: Secondary | ICD-10-CM | POA: Diagnosis not present

## 2017-01-21 DIAGNOSIS — N400 Enlarged prostate without lower urinary tract symptoms: Secondary | ICD-10-CM | POA: Diagnosis not present

## 2017-01-22 ENCOUNTER — Ambulatory Visit (INDEPENDENT_AMBULATORY_CARE_PROVIDER_SITE_OTHER): Payer: Medicare Other | Admitting: Endocrinology

## 2017-01-22 ENCOUNTER — Encounter: Payer: Self-pay | Admitting: Endocrinology

## 2017-01-22 VITALS — BP 112/78 | HR 83 | Ht 67.0 in | Wt 202.0 lb

## 2017-01-22 DIAGNOSIS — E119 Type 2 diabetes mellitus without complications: Secondary | ICD-10-CM | POA: Diagnosis not present

## 2017-01-22 LAB — POCT GLYCOSYLATED HEMOGLOBIN (HGB A1C): Hemoglobin A1C: 6.5

## 2017-01-22 MED ORDER — ATORVASTATIN CALCIUM 80 MG PO TABS: 80.0000 mg | ORAL_TABLET | Freq: Every day | ORAL | 3 refills | Status: DC

## 2017-01-22 MED ORDER — SITAGLIPTIN PHOSPHATE 100 MG PO TABS: 50.0000 mg | ORAL_TABLET | Freq: Every day | ORAL | 1 refills | Status: DC

## 2017-01-22 MED ORDER — METFORMIN HCL ER 500 MG PO TB24: 500.0000 mg | ORAL_TABLET | Freq: Two times a day (BID) | ORAL | 3 refills | Status: DC

## 2017-01-22 MED ORDER — LOSARTAN POTASSIUM-HCTZ 100-25 MG PO TABS: 1.0000 | ORAL_TABLET | Freq: Every day | ORAL | 3 refills | Status: DC

## 2017-01-22 NOTE — Progress Notes (Signed)
Subjective:    Patient ID: [name], male    DOB: 04-Nov-1941, 76 y.o.   MRN: [id number]  HPI Pt returns for f/u of diabetes mellitus: DM type: Insulin-requiring type 2. Dx'ed: 0071 Complications: nephropathy, and CVA. Therapy: 2 oral meds DKA: never. Severe hypoglycemia: never.  Pancreatitis: never.   Other: he has never taken insulin; metformin-XR dosage is limited by diarrhea.  Interval history:  no cbg record, but states cbg's are well-controlled.  pt states he feels well in general.  Past Medical History:  Diagnosis Date  . DIABETES MELLITUS, TYPE II 07/09/2007  . FATTY LIVER DISEASE 01/12/2009  . HYPERLIPIDEMIA 07/09/2007  . HYPERTENSION 07/09/2007    Past Surgical History:  Procedure Laterality Date  . Stress Cardiolite  08/23/2004    Social History   Social History  . Marital status: Married    Spouse name: N/A  . Number of children: N/A  . Years of education: N/A   Occupational History  . Retired    Social History Main Topics  . Smoking status: Never Smoker  . Smokeless tobacco: Never Used  . Alcohol use No  . Drug use: No  . Sexual activity: Yes   Other Topics Concern  . Not on file   Social History Narrative  . No narrative on file    Current Outpatient Prescriptions on File Prior to Visit  Medication Sig Dispense Refill  . aspirin 81 MG tablet Take 81 mg by mouth daily.      . fluticasone (FLONASE) 50 MCG/ACT nasal spray Place 2 sprays into both nostrils daily. (Patient taking differently: Place 2 sprays into both nostrils as needed. ) 48 g 3  . tamsulosin (FLOMAX) 0.4 MG CAPS capsule Take 0.4 mg by mouth.     No current facility-administered medications on file prior to visit.     Allergies  Allergen Reactions  . Lovastatin     REACTION: Foot pain  . Simvastatin     REACTION: Myalgias    Family History  Problem Relation Age of Onset  . Cancer Neg Hx     BP 112/78   Pulse 83   Ht 5' 7"  (1.702 m)   Wt 202 lb (91.6 kg)   SpO2 96%    BMI 31.64 kg/m    Review of Systems He denies hypoglycemia.     Objective:   Physical Exam VITAL SIGNS:  See vs page GENERAL: no distress Pulses: dorsalis pedis intact bilat.   MSK: no deformity of the feet CV: trace bilat leg edema Skin:  no ulcer on the feet.  normal color and temp on the feet.  Neuro: sensation is intact to touch on the feet. Ext: There is bilateral onychomycosis of the toenails.   Lab Results  Component Value Date   HGBA1C 6.5 01/22/2017      Assessment & Plan:  Type 2 DM, with CVA: well-controlled HTN: well-controlled.   Patient is advised the following: Patient Instructions  check your blood sugar 4 times a day.  vary the time of day when you check, between before the 3 meals, and at bedtime.  also check if you have symptoms of your blood sugar being too high or too low.  please keep a record of the readings and bring it to your next appointment here (or you can bring the meter itself).  You can write it on any piece of paper.  please call us sooner if your blood sugar goes below 70, or if you have a  lot of readings over 200. Please continue the same medications.   Please come back for a follow-up appointment in 4 months.

## 2017-01-22 NOTE — Patient Instructions (Signed)
check your blood sugar 4 times a day.  vary the time of day when you check, between before the 3 meals, and at bedtime.  also check if you have symptoms of your blood sugar being too high or too low.  please keep a record of the readings and bring it to your next appointment here (or you can bring the meter itself).  You can write it on any piece of paper.  please call us sooner if your blood sugar goes below 70, or if you have a lot of readings over 200. Please continue the same medications.   Please come back for a follow-up appointment in 4 months.

## 2017-05-24 ENCOUNTER — Encounter: Payer: Self-pay | Admitting: Endocrinology

## 2017-05-24 ENCOUNTER — Ambulatory Visit (INDEPENDENT_AMBULATORY_CARE_PROVIDER_SITE_OTHER): Payer: Medicare Other | Admitting: Endocrinology

## 2017-05-24 VITALS — BP 132/88 | HR 71

## 2017-05-24 DIAGNOSIS — E119 Type 2 diabetes mellitus without complications: Secondary | ICD-10-CM

## 2017-05-24 DIAGNOSIS — Z0000 Encounter for general adult medical examination without abnormal findings: Secondary | ICD-10-CM

## 2017-05-24 DIAGNOSIS — J309 Allergic rhinitis, unspecified: Secondary | ICD-10-CM

## 2017-05-24 DIAGNOSIS — R972 Elevated prostate specific antigen [PSA]: Secondary | ICD-10-CM | POA: Diagnosis not present

## 2017-05-24 LAB — BASIC METABOLIC PANEL
BUN: 29 mg/dL — AB (ref 6–23)
CALCIUM: 9.8 mg/dL (ref 8.4–10.5)
CO2: 28 mEq/L (ref 19–32)
Chloride: 101 mEq/L (ref 96–112)
Creatinine, Ser: 1.4 mg/dL (ref 0.40–1.50)
GFR: 52.5 mL/min — AB (ref >60.00)
GLUCOSE: 122 mg/dL — AB (ref 70–99)
Potassium: 3.9 mEq/L (ref 3.5–5.1)
Sodium: 139 mEq/L (ref 135–145)

## 2017-05-24 LAB — CBC WITH DIFFERENTIAL/PLATELET
BASOS ABS: 0.1 10*3/uL (ref 0.0–0.1)
Basophils Relative: 1 % (ref 0.0–3.0)
EOS ABS: 0.2 10*3/uL (ref 0.0–0.7)
Eosinophils Relative: 2.8 % (ref 0.0–5.0)
HCT: 43.7 % (ref 39.0–52.0)
Hemoglobin: 14.5 g/dL (ref 13.0–17.0)
LYMPHS ABS: 1.6 10*3/uL (ref 0.7–4.0)
Lymphocytes Relative: 21.6 % (ref 12.0–46.0)
MCHC: 33.3 g/dL (ref 30.0–36.0)
MCV: 93.8 fl (ref 78.0–100.0)
MONO ABS: 0.7 10*3/uL (ref 0.1–1.0)
MONOS PCT: 9.2 % (ref 3.0–12.0)
NEUTROS ABS: 5 10*3/uL (ref 1.4–7.7)
NEUTROS PCT: 65.4 % (ref 43.0–77.0)
PLATELETS: 211 10*3/uL (ref 150.0–400.0)
RBC: 4.66 Mil/uL (ref 4.22–5.81)
RDW: 14.2 % (ref 11.5–15.5)
WBC: 7.6 10*3/uL (ref 4.0–10.5)

## 2017-05-24 LAB — HEPATIC FUNCTION PANEL
ALBUMIN: 3.9 g/dL (ref 3.5–5.2)
ALK PHOS: 80 U/L (ref 39–117)
ALT: 45 U/L (ref 0–53)
AST: 28 U/L (ref 0–37)
Bilirubin, Direct: 0.2 mg/dL (ref 0.0–0.3)
TOTAL PROTEIN: 6.3 g/dL (ref 6.0–8.3)
Total Bilirubin: 0.7 mg/dL (ref 0.2–1.2)

## 2017-05-24 LAB — URINALYSIS, ROUTINE W REFLEX MICROSCOPIC
Bilirubin Urine: NEGATIVE
HGB URINE DIPSTICK: NEGATIVE
KETONES UR: NEGATIVE
LEUKOCYTES UA: NEGATIVE
Nitrite: NEGATIVE
SPECIFIC GRAVITY, URINE: 1.02 (ref 1.000–1.030)
TOTAL PROTEIN, URINE-UPE24: NEGATIVE
URINE GLUCOSE: NEGATIVE
Urobilinogen, UA: 0.2 (ref 0.0–1.0)
pH: 5.5 (ref 5.0–8.0)

## 2017-05-24 LAB — LIPID PANEL
Cholesterol: 103 mg/dL (ref 0–200)
HDL: 42.2 mg/dL (ref >39.00)
LDL CALC: 43 mg/dL (ref 0–99)
NONHDL: 60.37
Total CHOL/HDL Ratio: 2
Triglycerides: 87 mg/dL (ref 0.0–149.0)
VLDL: 17.4 mg/dL (ref 0.0–40.0)

## 2017-05-24 LAB — TSH: TSH: 1.2 u[IU]/mL (ref 0.35–4.50)

## 2017-05-24 LAB — POCT GLYCOSYLATED HEMOGLOBIN (HGB A1C): Hemoglobin A1C: 6.5

## 2017-05-24 LAB — MICROALBUMIN / CREATININE URINE RATIO
Creatinine,U: 184 mg/dL
Microalb Creat Ratio: 0.4 mg/g (ref 0.0–30.0)

## 2017-05-24 LAB — PSA: PSA: 7.19 ng/mL — AB (ref 0.10–4.00)

## 2017-05-24 MED ORDER — FLUTICASONE PROPIONATE 50 MCG/ACT NA SUSP: 2.0000 | NASAL | 3 refills | Status: DC | PRN

## 2017-05-24 NOTE — Progress Notes (Signed)
we discussed code status.  pt requests full code, but would not want to be started or maintained on artificial life-support measures if there was not a reasonable chance of recovery 

## 2017-05-24 NOTE — Progress Notes (Signed)
Subjective:    Patient ID: [name], male    DOB: [birth year], 76 y.o.   MRN: [id number]  HPI Pt returns for f/u of diabetes mellitus: DM type: Insulin-requiring type 2. Dx'ed: 2993 Complications: nephropathy, and CVA. Therapy: 2 oral meds DKA: never. Severe hypoglycemia: never.  Pancreatitis: never.   Other: he has never taken insulin; metformin-XR dosage is limited by diarrhea.  Interval history:  no cbg record, but states cbg's are well-controlled.  pt states he feels well in general.  Past Medical History:  Diagnosis Date  . DIABETES MELLITUS, TYPE II 07/09/2007  . FATTY LIVER DISEASE 01/12/2009  . HYPERLIPIDEMIA 07/09/2007  . HYPERTENSION 07/09/2007    Past Surgical History:  Procedure Laterality Date  . Stress Cardiolite  08/23/2004    Social History   Social History  . Marital status: Married    Spouse name: N/A  . Number of children: N/A  . Years of education: N/A   Occupational History  . Retired    Social History Main Topics  . Smoking status: Never Smoker  . Smokeless tobacco: Never Used  . Alcohol use No  . Drug use: No  . Sexual activity: Yes   Other Topics Concern  . Not on file   Social History Narrative  . No narrative on file    Current Outpatient Prescriptions on File Prior to Visit  Medication Sig Dispense Refill  . aspirin 81 MG tablet Take 81 mg by mouth daily.      Marland Kitchen atorvastatin (LIPITOR) 80 MG tablet Take 1 tablet (80 mg total) by mouth daily. 90 tablet 3  . losartan-hydrochlorothiazide (HYZAAR) 100-25 MG tablet Take 1 tablet by mouth daily. 90 tablet 3  . metFORMIN (GLUCOPHAGE-XR) 500 MG 24 hr tablet Take 1 tablet (500 mg total) by mouth 2 (two) times daily. 180 tablet 3  . sitaGLIPtin (JANUVIA) 100 MG tablet Take 0.5 tablets (50 mg total) by mouth daily. 45 tablet 1  . tamsulosin (FLOMAX) 0.4 MG CAPS capsule Take 0.4 mg by mouth.     No current facility-administered medications on file prior to visit.     Allergies  Allergen  Reactions  . Lovastatin     REACTION: Foot pain  . Simvastatin     REACTION: Myalgias    Family History  Problem Relation Age of Onset  . Cancer Neg Hx     BP 132/88   Pulse 71   SpO2 97%    Review of Systems He denies hypoglycemia    Objective:   Physical Exam VITAL SIGNS:  See vs page GENERAL: no distress Pulses: foot pulses are intact bilaterally.   MSK: no deformity of the feet or ankles.  CV: no edema of the legs or ankles Skin:  no ulcer on the feet or ankles.  normal color and temp on the feet and ankles Neuro: sensation is intact to touch on the feet and ankles.   Ext: There is bilateral onychomycosis of the toenails   A1c=6.5% Lab Results  Component Value Date   PSA 7.19 (H) 05/24/2017   PSA 5.78 (H) 05/24/2016   PSA 4.19 (H) 03/28/2015   Lab Results  Component Value Date   CREATININE 1.40 05/24/2017   BUN 29 (H) 05/24/2017   NA 139 05/24/2017   K 3.9 05/24/2017   CL 101 05/24/2017   CO2 28 05/24/2017      Assessment & Plan:  Insulin-requiring type 2 DM, with CVA: well-controlled elev PSA, slightly worse Renal failure, persistent.  We'll follow   Subjective:   Patient here for Medicare annual wellness visit and management of other chronic and acute problems.     Risk factors: advanced age    27 of Physicians Providing Medical Care to Patient:  See "snapshot"   Activities of Daily Living: In your present state of health, do you have any difficulty performing the following activities (lives with wife)?:  Preparing food and eating?: No  Bathing yourself: No  Getting dressed: No  Using the toilet:No  Moving around from place to place: No  In the past year have you fallen or had a near fall?:No    Home Safety: Has smoke detector and wears seat belts. Firearms are safely stored. No excess sun exposure.  Diet and Exercise  Current exercise habits: pt says good Dietary issues discussed: pt reports a healthy diet   Depression Screen    Q1: Over the past two weeks, have you felt down, depressed or hopeless? no  Q2: Over the past two weeks, have you felt little interest or pleasure in doing things? no   The following portions of the patient's history were reviewed and updated as appropriate: allergies, current medications, past family history, past medical history, past social history, past surgical history and problem list.   Review of Systems  Denies hearing loss, and visual loss Objective:   Vision:  Advertising account executive, so he declines VA today.   Hearing: grossly normal Body mass index:  See vs page Msk: pt easily and quickly performs "get-up-and-go" from a sitting position Cognitive Impairment Assessment: cognition, memory and judgment appear normal.  remembers 2/3 at 5 minutes (? effort).  excellent recall.  can easily read and write a sentence.  alert and oriented x 3.    Assessment:   Medicare wellness utd on preventive parameters    Plan:   During the course of the visit the patient was educated and counseled about appropriate screening and preventive services including:        Fall prevention is advised.   Diabetes care is performed.  Nutrition counseling is offered.    Vaccines are updated as needed  Patient Instructions (the written plan) was given to the patient.

## 2017-05-24 NOTE — Patient Instructions (Addendum)
check your blood sugar 4 times a day.  vary the time of day when you check, between before the 3 meals, and at bedtime.  also check if you have symptoms of your blood sugar being too high or too low.  please keep a record of the readings and bring it to your next appointment here (or you can bring the meter itself).  You can write it on any piece of paper.  please call us sooner if your blood sugar goes below 70, or if you have a lot of readings over 200. Please continue the same medications.   Please consider these measures for your health:  minimize alcohol.  Do not use tobacco products.  Have a colonoscopy at least every 10 years from age 61.  Keep firearms safely stored.  Always use seat belts.  have working smoke alarms in your home.  See an eye doctor and dentist regularly.  Never drive under the influence of alcohol or drugs (including prescription drugs).  Those with fair skin should take precautions against the sun, and should carefully examine their skin once per month, for any new or changed moles. blood tests are requested for you today.  We'll let you know about the results. Please come back for a regular physical appointment in 3 months.

## 2017-05-27 ENCOUNTER — Other Ambulatory Visit: Payer: Self-pay

## 2017-05-27 DIAGNOSIS — J309 Allergic rhinitis, unspecified: Secondary | ICD-10-CM

## 2017-05-27 MED ORDER — FLUTICASONE PROPIONATE 50 MCG/ACT NA SUSP: NASAL | 3 refills | Status: DC

## 2017-05-28 ENCOUNTER — Other Ambulatory Visit: Payer: Self-pay | Admitting: Endocrinology

## 2017-05-28 DIAGNOSIS — J309 Allergic rhinitis, unspecified: Secondary | ICD-10-CM

## 2017-05-28 MED ORDER — FLUTICASONE PROPIONATE 50 MCG/ACT NA SUSP: 2.0000 | Freq: Every day | NASAL | 3 refills | Status: DC

## 2017-06-06 ENCOUNTER — Other Ambulatory Visit: Payer: Self-pay

## 2017-06-06 ENCOUNTER — Telehealth: Payer: Self-pay | Admitting: Endocrinology

## 2017-06-06 DIAGNOSIS — J309 Allergic rhinitis, unspecified: Secondary | ICD-10-CM

## 2017-06-06 MED ORDER — FLUTICASONE PROPIONATE 50 MCG/ACT NA SUSP: 2.0000 | Freq: Every day | NASAL | 3 refills | Status: AC

## 2017-06-06 NOTE — Telephone Encounter (Signed)
Patient needs a call back RE Flonex script.  Thank you,  -LL

## 2017-06-06 NOTE — Telephone Encounter (Signed)
Called patient and [facility]. [facility] just needed to know how flonase was prescribed so they released his prescription. Followed up with patient and let him know issue was resolved.

## 2017-06-06 NOTE — Telephone Encounter (Signed)
Patient called to check the status of the note below and would like a call as soon as possible.

## 2017-06-06 NOTE — Telephone Encounter (Signed)
Routing to you °

## 2017-06-26 ENCOUNTER — Other Ambulatory Visit: Payer: Self-pay | Admitting: Endocrinology

## 2017-08-26 ENCOUNTER — Encounter: Payer: Self-pay | Admitting: Endocrinology

## 2017-08-26 ENCOUNTER — Ambulatory Visit (INDEPENDENT_AMBULATORY_CARE_PROVIDER_SITE_OTHER): Payer: Medicare Other | Admitting: Endocrinology

## 2017-08-26 DIAGNOSIS — E119 Type 2 diabetes mellitus without complications: Secondary | ICD-10-CM | POA: Diagnosis not present

## 2017-08-26 DIAGNOSIS — Z0000 Encounter for general adult medical examination without abnormal findings: Secondary | ICD-10-CM

## 2017-08-26 DIAGNOSIS — I10 Essential (primary) hypertension: Secondary | ICD-10-CM | POA: Diagnosis not present

## 2017-08-26 LAB — POCT GLYCOSYLATED HEMOGLOBIN (HGB A1C): Hemoglobin A1C: 6.2

## 2017-08-26 NOTE — Patient Instructions (Addendum)
Please consider these measures for your health:  minimize alcohol.  Do not use tobacco products.  Have a colonoscopy at least every 10 years from age 75.  Keep firearms safely stored.  Always use seat belts.  have working smoke alarms in your home.  See an eye doctor and dentist regularly.  Never drive under the influence of alcohol or drugs (including prescription drugs).  Those with fair skin should take precautions against the sun, and should carefully examine their skin once per month, for any new or changed moles.   Please continue the same medications.   Please come back for a follow-up appointment in 4-6 months.

## 2017-08-26 NOTE — Progress Notes (Signed)
Subjective:    Patient ID: [name], male    DOB: [birth year], 76 y.o.   MRN: [id number]  HPI Pt is here for regular wellness examination, and is feeling pretty well in general, and says chronic med probs are stable, except as noted below Past Medical History:  Diagnosis Date  . DIABETES MELLITUS, TYPE II 07/09/2007  . FATTY LIVER DISEASE 01/12/2009  . HYPERLIPIDEMIA 07/09/2007  . HYPERTENSION 07/09/2007    Past Surgical History:  Procedure Laterality Date  . Stress Cardiolite  08/23/2004    Social History   Social History  . Marital status: Married    Spouse name: N/A  . Number of children: N/A  . Years of education: N/A   Occupational History  . Retired    Social History Main Topics  . Smoking status: Never Smoker  . Smokeless tobacco: Never Used  . Alcohol use No  . Drug use: No  . Sexual activity: Yes   Other Topics Concern  . Not on file   Social History Narrative  . No narrative on file    Current Outpatient Prescriptions on File Prior to Visit  Medication Sig Dispense Refill  . aspirin 81 MG tablet Take 81 mg by mouth daily.      Marland Kitchen atorvastatin (LIPITOR) 80 MG tablet Take 1 tablet (80 mg total) by mouth daily. 90 tablet 3  . fluticasone (FLONASE) 50 MCG/ACT nasal spray Place 2 sprays into both nostrils daily. 2 sprays in each nostrils as needed. 48 g 3  . JANUVIA 100 MG tablet TAKE ONE-HALF TABLET BY  MOUTH DAILY 45 tablet 4  . losartan-hydrochlorothiazide (HYZAAR) 100-25 MG tablet Take 1 tablet by mouth daily. 90 tablet 3  . metFORMIN (GLUCOPHAGE-XR) 500 MG 24 hr tablet Take 1 tablet (500 mg total) by mouth 2 (two) times daily. 180 tablet 3  . tamsulosin (FLOMAX) 0.4 MG CAPS capsule Take 0.4 mg by mouth.     No current facility-administered medications on file prior to visit.     Allergies  Allergen Reactions  . Lovastatin     REACTION: Foot pain  . Simvastatin     REACTION: Myalgias    Family History  Problem Relation Age of Onset  . Cancer  Neg Hx     BP 130/70 (BP Location: Left Arm, Patient Position: Sitting, Cuff Size: Normal)   Pulse (!) 56   Wt 199 lb 8 oz (90.5 kg)   SpO2 96%   BMI 31.25 kg/m   Review of Systems Denies fever, fatigue, visual loss, hearing loss, chest pain, sob, back pain, depression, cold intolerance, difficulty with urination, BRBPR, hematuria, syncope, numbness, allergy sxs, easy bruising, and rash.      Objective:   Physical Exam VS: see vs page GEN: no distress HEAD: head: no deformity eyes: no periorbital swelling, no proptosis external nose and ears are normal mouth: no lesion seen NECK: supple, thyroid is not enlarged CHEST WALL: no deformity LUNGS: clear to auscultation. BREASTS:  No gynecomastia CV: reg rate and rhythm, no murmur ABD: abdomen is soft, nontender.  no hepatosplenomegaly.  not distended.  no hernia RECTAL/PROSTATE: sees urology MUSCULOSKELETAL: muscle bulk and strength are grossly normal.  no obvious joint swelling.  gait is normal and steady EXTEMITIES: no deformity.  no ulcer on the feet.  feet are of normal color and temp.  Trace bilat leg edema, and bilat vv's.  There is bilateral onychomycosis of the toenails PULSES: dorsalis pedis intact bilat.  no carotid bruit.  NEURO:  cn 2-12 grossly intact.   readily moves all 4's.  sensation is intact to touch on the feet.  SKIN:  Normal texture and temperature.  No rash or suspicious lesion is visible.  NODES:  None palpable at the neck.  PSYCH: alert, well-oriented.  Does not appear anxious nor depressed.   ecg machine is not working today.     Assessment & Plan:  Wellness visit today, with problems stable, except as noted.    Patient Instructions  Please consider these measures for your health:  minimize alcohol.  Do not use tobacco products.  Have a colonoscopy at least every 10 years from age 60.  Keep firearms safely stored.  Always use seat belts.  have working smoke alarms in your home.  See an eye doctor and  dentist regularly.  Never drive under the influence of alcohol or drugs (including prescription drugs).  Those with fair skin should take precautions against the sun, and should carefully examine their skin once per month, for any new or changed moles.   Please continue the same medications.   Please come back for a follow-up appointment in 4-6 months.

## 2017-10-18 ENCOUNTER — Ambulatory Visit: Payer: Medicare Other | Admitting: Podiatry

## 2017-10-18 ENCOUNTER — Encounter: Payer: Self-pay | Admitting: Podiatry

## 2017-10-18 VITALS — BP 106/66 | HR 78

## 2017-10-18 DIAGNOSIS — B351 Tinea unguium: Secondary | ICD-10-CM | POA: Diagnosis not present

## 2017-10-18 DIAGNOSIS — E119 Type 2 diabetes mellitus without complications: Secondary | ICD-10-CM

## 2017-10-18 DIAGNOSIS — M79675 Pain in left toe(s): Secondary | ICD-10-CM | POA: Diagnosis not present

## 2017-10-18 DIAGNOSIS — M79674 Pain in right toe(s): Secondary | ICD-10-CM

## 2017-10-18 NOTE — Progress Notes (Signed)
   Subjective:    Patient ID: [name], male    DOB: 1940-12-25, 76 y.o.   MRN: [id number]  HPI this patient presents the office for an evaluation and treatment of his diabetic feet.  Marland Kitchen He says his nails have grown thick and long and are painful walking wearing his shoes.  Patient states he is unable to self treat.  Patient is diabetic on medication.  He presents the office today for preventative foot care services    Review of Systems  All other systems reviewed and are negative.      Objective:   Physical Exam General Appearance  Alert, conversant and in no acute stress.  Vascular  Dorsalis pedis and posterior pulses are palpable  bilaterally.  Capillary return is within normal limits  Bilaterally. Temperature is within normal limits  Bilaterally  Neurologic  Senn-Weinstein monofilament wire test within normal limits  bilaterally. Muscle power  Within normal limits bilaterally.  Nails Thick disfigured discolored nails with subungual debris bilaterally from hallux to fifth toes bilaterally. No evidence of bacterial infection or drainage bilaterally.  Orthopedic  No limitations of motion of motion feet bilaterally.  No crepitus or effusions noted.  No bony pathology or digital deformities noted.  Skin  normotropic skin with no porokeratosis noted bilaterally.  No signs of infections or ulcers noted.          Assessment & Plan:  Onychomycosis  B/L  Diabetes with no foot complications.   IE  Debride nails  Diabetic foot exam performed.  RTC 3 months   Gardiner Barefoot DPM

## 2018-01-14 DIAGNOSIS — R972 Elevated prostate specific antigen [PSA]: Secondary | ICD-10-CM | POA: Diagnosis not present

## 2018-01-17 ENCOUNTER — Ambulatory Visit: Payer: Medicare Other | Admitting: Podiatry

## 2018-01-21 DIAGNOSIS — R972 Elevated prostate specific antigen [PSA]: Secondary | ICD-10-CM | POA: Diagnosis not present

## 2018-01-21 DIAGNOSIS — N400 Enlarged prostate without lower urinary tract symptoms: Secondary | ICD-10-CM | POA: Diagnosis not present

## 2018-02-11 ENCOUNTER — Other Ambulatory Visit: Payer: Self-pay | Admitting: Endocrinology

## 2018-03-03 ENCOUNTER — Ambulatory Visit (INDEPENDENT_AMBULATORY_CARE_PROVIDER_SITE_OTHER): Payer: Medicare Other | Admitting: Endocrinology

## 2018-03-03 ENCOUNTER — Encounter: Payer: Self-pay | Admitting: Endocrinology

## 2018-03-03 VITALS — BP 118/64 | HR 83 | Resp 16 | Wt 199.8 lb

## 2018-03-03 DIAGNOSIS — E119 Type 2 diabetes mellitus without complications: Secondary | ICD-10-CM

## 2018-03-03 LAB — POCT GLYCOSYLATED HEMOGLOBIN (HGB A1C): Hemoglobin A1C: 6.4

## 2018-03-03 NOTE — Progress Notes (Signed)
Subjective:    Patient ID: [name], male    DOB: 1940/11/11, 77 y.o.   MRN: [id number]  HPI Pt returns for f/u of diabetes mellitus: DM type: Insulin-requiring type 2. Dx'ed: 7408 Complications: renal insuff and CVA. Therapy: 2 oral meds DKA: never. Severe hypoglycemia: never.  Pancreatitis: never.   Other: he has never taken insulin; metformin-XR dosage is limited by diarrhea.   Interval history:  Pt states cbg's are well-controlled.  pt states he feels well in general.   Past Medical History:  Diagnosis Date  . DIABETES MELLITUS, TYPE II 07/09/2007  . FATTY LIVER DISEASE 01/12/2009  . HYPERLIPIDEMIA 07/09/2007  . HYPERTENSION 07/09/2007    Past Surgical History:  Procedure Laterality Date  . Stress Cardiolite  08/23/2004    Social History   Socioeconomic History  . Marital status: Married    Spouse name: Not on file  . Number of children: Not on file  . Years of education: Not on file  . Highest education level: Not on file  Occupational History  . Occupation: Retired  Scientific laboratory technician  . Financial resource strain: Not on file  . Food insecurity:    Worry: Not on file    Inability: Not on file  . Transportation [name]:    Medical: Not on file    Non-medical: Not on file  Tobacco Use  . Smoking status: Never Smoker  . Smokeless tobacco: Never Used  Substance and Sexual Activity  . Alcohol use: No    Alcohol/week: 0.0 oz  . Drug use: No  . Sexual activity: Yes  Lifestyle  . Physical activity:    Days per week: Not on file    Minutes per session: Not on file  . Stress: Not on file  Relationships  . Social connections:    Talks on phone: Not on file    Gets together: Not on file    Attends religious service: Not on file    Active member of club or organization: Not on file    Attends meetings of clubs or organizations: Not on file    Relationship status: Not on file  . Intimate partner violence:    Fear of current or ex partner: Not on file   Emotionally abused: Not on file    Physically abused: Not on file    Forced sexual activity: Not on file  Other Topics Concern  . Not on file  Social History Narrative  . Not on file    Current Outpatient Medications on File Prior to Visit  Medication Sig Dispense Refill  . aspirin 81 MG tablet Take 81 mg by mouth daily.      Marland Kitchen atorvastatin (LIPITOR) 80 MG tablet TAKE 1 TABLET BY MOUTH  DAILY 90 tablet 3  . fluticasone (FLONASE) 50 MCG/ACT nasal spray Place 2 sprays into both nostrils daily. 2 sprays in each nostrils as needed. 48 g 3  . JANUVIA 100 MG tablet TAKE ONE-HALF TABLET BY  MOUTH DAILY 45 tablet 4  . losartan-hydrochlorothiazide (HYZAAR) 100-25 MG tablet TAKE 1 TABLET BY MOUTH  DAILY 90 tablet 3  . metFORMIN (GLUCOPHAGE-XR) 500 MG 24 hr tablet TAKE 1 TABLET BY MOUTH TWO  TIMES DAILY 180 tablet 3  . tamsulosin (FLOMAX) 0.4 MG CAPS capsule Take 0.4 mg by mouth.     No current facility-administered medications on file prior to visit.     Allergies  Allergen Reactions  . Lovastatin     REACTION: Foot pain  . Simvastatin  REACTION: Myalgias    Family History  Problem Relation Age of Onset  . Cancer Neg Hx     BP 118/64   Pulse 83   Resp 16   Wt 199 lb 12.8 oz (90.6 kg)   SpO2 97%   BMI 31.29 kg/m    Review of Systems Denies sob    Objective:   Physical Exam VITAL SIGNS:  See vs page GENERAL: no distress Pulses: foot pulses are intact bilaterally.   MSK: no deformity of the feet or ankles.  CV: trace bilat edema of the legs or ankles Skin:  no ulcer on the feet or ankles.  normal color and temp on the feet and ankles Neuro: sensation is intact to touch on the feet and ankles.   Ext: There is bilateral onychomycosis of the toenails   A1c=6.4%  Lab Results  Component Value Date   CREATININE 1.40 05/24/2017   BUN 29 (H) 05/24/2017   NA 139 05/24/2017   K 3.9 05/24/2017   CL 101 05/24/2017   CO2 28 05/24/2017      Assessment & Plan:  HTN:  well-controlled Type 2 DM, with h/o CVA: well-controlled Renal insuff: this limits rx options.   Patient Instructions  Please continue the same medications.   Please come back for a follow-up appointment in 4-6 months.

## 2018-03-03 NOTE — Patient Instructions (Signed)
Please continue the same medications.   Please come back for a follow-up appointment in 4-6 months.

## 2018-07-02 ENCOUNTER — Encounter: Payer: Self-pay | Admitting: Family Medicine

## 2018-07-02 ENCOUNTER — Ambulatory Visit (INDEPENDENT_AMBULATORY_CARE_PROVIDER_SITE_OTHER): Payer: Medicare Other | Admitting: Family Medicine

## 2018-07-02 DIAGNOSIS — I10 Essential (primary) hypertension: Secondary | ICD-10-CM

## 2018-07-02 DIAGNOSIS — E1159 Type 2 diabetes mellitus with other circulatory complications: Secondary | ICD-10-CM

## 2018-07-02 DIAGNOSIS — E785 Hyperlipidemia, unspecified: Secondary | ICD-10-CM

## 2018-07-02 DIAGNOSIS — E119 Type 2 diabetes mellitus without complications: Secondary | ICD-10-CM | POA: Diagnosis not present

## 2018-07-02 DIAGNOSIS — E1169 Type 2 diabetes mellitus with other specified complication: Secondary | ICD-10-CM

## 2018-07-02 DIAGNOSIS — I152 Hypertension secondary to endocrine disorders: Secondary | ICD-10-CM

## 2018-07-02 MED ORDER — TRIAMCINOLONE ACETONIDE 0.5 % EX OINT: 1.0000 "application " | TOPICAL_OINTMENT | Freq: Two times a day (BID) | CUTANEOUS | 0 refills | Status: DC

## 2018-07-02 NOTE — Progress Notes (Signed)
   Subjective:  [name] is a 77 y.o. male who presents today with a chief complaint of rash and to transfer care.   HPI:  Rash, acute problem Symptoms started 2 days ago.  Located on the inner aspect of his right arm.  No clear precipitating events.  No new exposures.  He has tried topical cortisone cream without improvement.  Rash is very itchy and red.  No pain.  No drainage.  No fevers or chills.  No obvious alleviating or aggravating factors.  Type 2 diabetes, chronic problem, stable Currently on Januvia 100 mg daily and metformin 500 mg twice daily.  Tolerates both these medications well without side effects.  Hypertension, chronic problem, stable Currently on losartan-HCTZ 100-25 mg tablet once daily.  Tolerating well.  No reported chest pain or shortness of breath.  Dyslipidemia, chronic problem, stable On Lipitor 80 mg daily.  Tolerating well.  ROS: Per HPI  PMH: He reports that he has quit smoking. He has never used smokeless tobacco. He reports that he does not drink alcohol or use drugs.  Objective:  Physical Exam: BP 120/72 (BP Location: Left Arm, Patient Position: Sitting, Cuff Size: Normal)   Pulse 61   Temp (!) 97.5 F (36.4 C) (Oral)   Ht 5' 8"  (1.727 m)   Wt 202 lb 3.2 oz (91.7 kg)   SpO2 97%   BMI 30.74 kg/m   Wt Readings from Last 3 Encounters:  07/02/18 202 lb 3.2 oz (91.7 kg)  03/03/18 199 lb 12.8 oz (90.6 kg)  08/26/17 199 lb 8 oz (90.5 kg)  Gen: NAD, resting comfortably CV: RRR with no murmurs appreciated Pulm: NWOB, CTAB with no crackles, wheezes, or rhonchi MSK: No edema, cyanosis, or clubbing noted Skin: Approximately 15 cm x 7 cm mildly erythematous area on medial aspect of right elbow.  Nontender to palpation.  No excoriations.  No spreading erythema.  No drainage.  No open sores or lesions. Neuro: Grossly normal, moves all extremities Psych: Normal affect and thought content  Assessment/Plan:  Rash No red flag signs or symptoms.  No  obvious signs of cellulitis or systemic illness.  Consistent with mild allergic reaction.  Start triamcinolone ointment twice daily for the next 1 to 2 weeks.  Discussed reasons to return to care.    Diabetes ([place]) Continue metformin and Januvia.  Follow-up in 3 months.  Check A1c at that time.  Hypertension associated with diabetes ([place]) At goal on losartan-HCTZ.  Will continue.  Check CMET with next blood draw.  Dyslipidemia associated with type 2 diabetes mellitus (HCC) Continue atorvastatin 80 mg daily.  Check lipid panel with next blood draw.  Algis Greenhouse. [name], MD 07/02/2018 12:15 PM

## 2018-07-02 NOTE — Assessment & Plan Note (Signed)
Continue atorvastatin 80 mg daily.  Check lipid panel with next blood draw.

## 2018-07-02 NOTE — Patient Instructions (Signed)
It was very nice to see you today!  Please start the triamcinolone ointment.  No other changes today.  Let me know if your rash does not improve  Come back in 3 for your next visit with blood work.   Take care, Dr [name]

## 2018-07-02 NOTE — Assessment & Plan Note (Signed)
Continue metformin and Januvia.  Follow-up in 3 months.  Check A1c at that time.

## 2018-07-02 NOTE — Assessment & Plan Note (Signed)
At goal on losartan-HCTZ.  Will continue.  Check CMET with next blood draw.

## 2018-07-15 ENCOUNTER — Other Ambulatory Visit: Payer: Self-pay

## 2018-07-15 NOTE — Patient Outreach (Signed)
[facility]) Care Management  07/15/2018  [name] [birth year] [id number]   Medication Adherence call to [name] left a message for patient to call back patient is due on Losartan/Hctz and Atorvastatin 80 mg. [name] is showing past due under [place].  [facility] Direct Dial [phone]  Fax [phone] [name]

## 2018-07-24 ENCOUNTER — Other Ambulatory Visit: Payer: Self-pay

## 2018-07-24 MED ORDER — ATORVASTATIN CALCIUM 80 MG PO TABS: 80.0000 mg | ORAL_TABLET | Freq: Every day | ORAL | 3 refills | Status: DC

## 2018-07-24 MED ORDER — LOSARTAN POTASSIUM-HCTZ 100-25 MG PO TABS: 1.0000 | ORAL_TABLET | Freq: Every day | ORAL | 3 refills | Status: DC

## 2018-07-24 MED ORDER — METFORMIN HCL ER 500 MG PO TB24: 500.0000 mg | ORAL_TABLET | Freq: Two times a day (BID) | ORAL | 3 refills | Status: DC

## 2018-07-24 MED ORDER — SITAGLIPTIN PHOSPHATE 100 MG PO TABS: 50.0000 mg | ORAL_TABLET | Freq: Every day | ORAL | 4 refills | Status: DC

## 2018-08-07 ENCOUNTER — Telehealth: Payer: Self-pay | Admitting: Endocrinology

## 2018-08-07 NOTE — Telephone Encounter (Signed)
Do not see any documentation where anyone from this office has reached out to pt

## 2018-08-07 NOTE — Telephone Encounter (Signed)
Patient is returning call. Please advise, thanks

## 2018-09-02 ENCOUNTER — Ambulatory Visit: Payer: Medicare Other | Admitting: Endocrinology

## 2018-10-06 ENCOUNTER — Ambulatory Visit (INDEPENDENT_AMBULATORY_CARE_PROVIDER_SITE_OTHER): Payer: Medicare Other | Admitting: Family Medicine

## 2018-10-06 ENCOUNTER — Encounter: Payer: Self-pay | Admitting: Family Medicine

## 2018-10-06 VITALS — BP 108/70 | HR 68 | Temp 97.5°F | Ht 68.0 in | Wt 202.2 lb

## 2018-10-06 DIAGNOSIS — E1159 Type 2 diabetes mellitus with other circulatory complications: Secondary | ICD-10-CM

## 2018-10-06 DIAGNOSIS — I10 Essential (primary) hypertension: Secondary | ICD-10-CM | POA: Diagnosis not present

## 2018-10-06 DIAGNOSIS — E119 Type 2 diabetes mellitus without complications: Secondary | ICD-10-CM

## 2018-10-06 DIAGNOSIS — I152 Hypertension secondary to endocrine disorders: Secondary | ICD-10-CM

## 2018-10-06 DIAGNOSIS — K7581 Nonalcoholic steatohepatitis (NASH): Secondary | ICD-10-CM

## 2018-10-06 DIAGNOSIS — R972 Elevated prostate specific antigen [PSA]: Secondary | ICD-10-CM

## 2018-10-06 DIAGNOSIS — E785 Hyperlipidemia, unspecified: Secondary | ICD-10-CM | POA: Diagnosis not present

## 2018-10-06 DIAGNOSIS — E1169 Type 2 diabetes mellitus with other specified complication: Secondary | ICD-10-CM

## 2018-10-06 DIAGNOSIS — L570 Actinic keratosis: Secondary | ICD-10-CM | POA: Insufficient documentation

## 2018-10-06 DIAGNOSIS — J3489 Other specified disorders of nose and nasal sinuses: Secondary | ICD-10-CM | POA: Insufficient documentation

## 2018-10-06 LAB — COMPREHENSIVE METABOLIC PANEL
ALT: 48 U/L (ref 0–53)
AST: 30 U/L (ref 0–37)
Albumin: 4 g/dL (ref 3.5–5.2)
Alkaline Phosphatase: 86 U/L (ref 39–117)
BUN: 28 mg/dL — AB (ref 6–23)
CO2: 28 mEq/L (ref 19–32)
CREATININE: 1.47 mg/dL (ref 0.40–1.50)
Calcium: 9.2 mg/dL (ref 8.4–10.5)
Chloride: 103 mEq/L (ref 96–112)
GFR: 49.44 mL/min — ABNORMAL LOW (ref >60.00)
Glucose, Bld: 144 mg/dL — ABNORMAL HIGH (ref 70–99)
Potassium: 4.2 mEq/L (ref 3.5–5.1)
SODIUM: 139 meq/L (ref 135–145)
Total Bilirubin: 0.7 mg/dL (ref 0.2–1.2)
Total Protein: 6.7 g/dL (ref 6.0–8.3)

## 2018-10-06 LAB — LIPID PANEL
Cholesterol: 114 mg/dL (ref 0–200)
HDL: 41.4 mg/dL (ref >39.00)
LDL CALC: 54 mg/dL (ref 0–99)
NONHDL: 72.16
Total CHOL/HDL Ratio: 3
Triglycerides: 89 mg/dL (ref 0.0–149.0)
VLDL: 17.8 mg/dL (ref 0.0–40.0)

## 2018-10-06 LAB — CBC
HCT: 45 % (ref 39.0–52.0)
Hemoglobin: 15.1 g/dL (ref 13.0–17.0)
MCHC: 33.6 g/dL (ref 30.0–36.0)
MCV: 91.5 fl (ref 78.0–100.0)
Platelets: 215 10*3/uL (ref 150.0–400.0)
RBC: 4.92 Mil/uL (ref 4.22–5.81)
RDW: 14.4 % (ref 11.5–15.5)
WBC: 7.3 10*3/uL (ref 4.0–10.5)

## 2018-10-06 LAB — POCT GLYCOSYLATED HEMOGLOBIN (HGB A1C): Hemoglobin A1C: 6.2 % — AB (ref 4.0–5.6)

## 2018-10-06 LAB — TSH: TSH: 1.08 u[IU]/mL (ref 0.35–4.50)

## 2018-10-06 LAB — PSA: PSA: 7.23 ng/mL — ABNORMAL HIGH (ref 0.10–4.00)

## 2018-10-06 MED ORDER — IPRATROPIUM BROMIDE 0.06 % NA SOLN: 2.0000 | Freq: Four times a day (QID) | NASAL | 0 refills | Status: DC

## 2018-10-06 NOTE — Assessment & Plan Note (Signed)
At goal.  Continue losartan-HCTZ 100-25 tablet once daily.  Check CBC and CMET.  Check TSH.

## 2018-10-06 NOTE — Assessment & Plan Note (Signed)
Cryotherapy applied today.  Patient tolerated well.

## 2018-10-06 NOTE — Patient Instructions (Addendum)
It was very nice to see you today!  Your blood sugar and A1c look great.  We will not make any medication changes today.  We will check your PSA, kidney function, liver function, electrolytes, blood counts, and cholesterol levels today.  Please try the Atrovent for your runny nose and postnasal drip.  We froze a spot on your right ear today.  This was a precancerous lesion.  Please let me know if it comes back.  Come back to see me in 3 to 6 months, or sooner as needed.  Take care, Dr [name]   Cryotherapy WHAT IS CRYOTHERAPY? Cryotherapy, or cold therapy, is a treatment that uses cold temperatures to treat an injury or medical condition. It includes using cold packs or ice packs to reduce pain and swelling. Only use cryotherapy if your doctor says it is okay. HOW DO I USE CRYOTHERAPY?  Place a towel between the cold source and your skin.  Apply the cold source for no more than 20 minutes at a time.  Check your skin after 5 minutes to make sure there are no signs of a poor response to cold or skin damage. Check for: ? White spots on your skin. Your skin may look blotchy or mottled. ? Skin that looks blue or pale. ? Skin that feels waxy or hard.  Repeat these steps as many times each day as told by your doctor.  HOW CAN I MAKE A COLD PACK? When using a cold pack at home to reduce pain and swelling, you can use:  A silica gel cold pack that has been left in the freezer. You can buy this online or in stores.  A plastic bag of frozen vegetables.  A sealable plastic bag that has been filled with crushed ice.  Always wrap the pack in a dry or damp towel to avoid direct contact with your skin. WHEN SHOULD I CALL MY DOCTOR? Call your doctor if:  You start to have white spots on your skin. This may give your skin a blotchy or mottled look.  Your skin turns blue or pale.  Your skin becomes waxy or hard.  Your swelling gets worse.  This information is not intended to replace  advice given to you by your health care provider. Make sure you discuss any questions you have with your health care provider. Document Released: 04/09/2008 Document Revised: 03/29/2016 Document Reviewed: 07/06/2015 Elsevier Interactive Patient Education  2017 [employer].

## 2018-10-06 NOTE — Progress Notes (Signed)
   Subjective:  [name] is a 77 y.o. male who presents today with a chief complaint of T2DM.   HPI:  T2DM, chronic problem Currently on Januvia 100 mg daily metformin 500 mg twice daily.  He is tolerating both these well without side effects.  HTN Currently on losartan-HCTZ 100-25 mg tablet once daily.  Tolerating well.  No reported chest pain or shortness of breath.  HLD On Lipitor 80 mg daily.  Tolerating well.  No reported myalgias.  Elevated PSA Follows with urology for this.  Would like to have his PSA checked today.  Rhinorrhea Started a couple of weeks ago.  Has tried using Flonase with no improvement.  Occasional postnasal drip and cough.  No fevers or chills.  No other treatments tried.  Skin Lesion Started several months ago.  Located on top of right ear.  Occasionally picks it off however it grows back.  No specific treatments tried.  No other obvious alleviating or aggravating factors.  ROS: Per HPI  PMH: He reports that he has quit smoking. He has never used smokeless tobacco. He reports that he does not drink alcohol or use drugs.  Objective:  Physical Exam: BP 108/70 (BP Location: Left Arm, Patient Position: Sitting, Cuff Size: Large)   Pulse 68   Temp (!) 97.5 F (36.4 C) (Oral)   Ht 5' 8"  (1.727 m)   Wt 202 lb 4 oz (91.7 kg)   SpO2 97%   BMI 30.75 kg/m   Gen: NAD, resting comfortably HEENT: Hyperkeratotic lesion on superior aspect of right auricle. TMs clear. OP erythematous without effusion.  CV: RRR with no murmurs appreciated Pulm: NWOB, CTAB with no crackles, wheezes, or rhonchi MSK: No edema, cyanosis, or clubbing noted Skin: Warm, dry Neuro: Grossly normal, moves all extremities Psych: Normal affect and thought content  Results for orders placed or performed in visit on 10/06/18 (from the past 24 hour(s))  POCT HgB A1C     Status: Abnormal   Collection Time: 10/06/18  9:35 AM  Result Value Ref Range   Hemoglobin A1C 6.2 (A) 4.0 - 5.6 %      Cryotherapy Procedure Note  Pre-operative Diagnosis: Actinic keratosis  Locations: Right ear  Indications: Therapeutic  Procedure Details  Patient informed of risks (permanent scarring, infection, light or dark discoloration, bleeding, infection, weakness, numbness and recurrence of the lesion) and benefits of the procedure and verbal informed consent obtained.  The areas are treated with liquid nitrogen therapy, frozen until ice ball extended 2 mm beyond lesion, allowed to thaw, and treated again. The patient tolerated procedure well.  The patient was instructed on post-op care, warned that there may be blister formation, redness and pain. Recommend OTC analgesia as needed for pain.  Condition: Stable  Complications: none.   Assessment/Plan:  Diabetes (HCC) A1c 6.2.  Continue metformin 500 mg twice daily Januvia 50 mg daily.  Rhinorrhea Start Atrovent.  Continue Flonase.  PSA, INCREASED Check PSA.  Hypertension associated with diabetes ([place]) At goal.  Continue losartan-HCTZ 100-25 tablet once daily.  Check CBC and CMET.  Check TSH.  Dyslipidemia associated with type 2 diabetes mellitus (HCC) Check lipid panel.  Continue Lipitor 80 mg daily.  Actinic keratosis Cryotherapy applied today.  Patient tolerated well.  NASH (nonalcoholic steatohepatitis) Check CMET.    Algis Greenhouse. [name], MD 10/06/2018 9:56 AM

## 2018-10-06 NOTE — Assessment & Plan Note (Signed)
Check PSA. ?

## 2018-10-06 NOTE — Assessment & Plan Note (Signed)
Check lipid panel.  Continue Lipitor 80 mg daily.

## 2018-10-06 NOTE — Assessment & Plan Note (Signed)
A1c 6.2.  Continue metformin 500 mg twice daily Januvia 50 mg daily.

## 2018-10-06 NOTE — Assessment & Plan Note (Signed)
Start Atrovent.  Continue Flonase.

## 2018-10-06 NOTE — Assessment & Plan Note (Signed)
Check CMET. 

## 2018-10-07 NOTE — Progress Notes (Signed)
Dr Marigene Ehlers interpretation of your lab work:  Good news! All of your blood work is stable compared to last year. Keep up the good work and we will recheck again in 1 year.    If you have any additional questions, please give [place] a call or send [place] a message through [place].  Take care, Dr [name]

## 2018-10-30 ENCOUNTER — Ambulatory Visit: Payer: Medicare Other | Admitting: Podiatry

## 2018-10-30 ENCOUNTER — Encounter: Payer: Self-pay | Admitting: Podiatry

## 2018-10-30 DIAGNOSIS — B351 Tinea unguium: Secondary | ICD-10-CM | POA: Diagnosis not present

## 2018-10-30 DIAGNOSIS — M79674 Pain in right toe(s): Secondary | ICD-10-CM

## 2018-10-30 DIAGNOSIS — E119 Type 2 diabetes mellitus without complications: Secondary | ICD-10-CM | POA: Diagnosis not present

## 2018-10-30 DIAGNOSIS — M79675 Pain in left toe(s): Secondary | ICD-10-CM | POA: Diagnosis not present

## 2018-10-30 NOTE — Progress Notes (Signed)
Complaint:  Visit Type: Patient returns to my office for continued preventative foot care services. Complaint: Patient states" my nails have grown long and thick and become painful to walk and wear shoes" Patient has been diagnosed with DM with no foot complications. The patient presents for preventative foot care services. No changes to ROS  Podiatric Exam: Vascular: dorsalis pedis and posterior tibial pulses are palpable bilateral. Capillary return is immediate. Temperature gradient is WNL. Skin turgor WNL  Sensorium: Normal Semmes Weinstein monofilament test. Normal tactile sensation bilaterally. Nail Exam: Pt has thick disfigured discolored nails with subungual debris noted bilateral entire nail hallux through fifth toenails Ulcer Exam: There is no evidence of ulcer or pre-ulcerative changes or infection. Orthopedic Exam: Muscle tone and strength are WNL. No limitations in general ROM. No crepitus or effusions noted. Foot type and digits show no abnormalities. Bony prominences are unremarkable. Skin: No Porokeratosis. No infection or ulcers  Diagnosis:  Onychomycosis, , Pain in right toe, pain in left toes  Treatment & Plan Procedures and Treatment: Consent by patient was obtained for treatment procedures.   Debridement of mycotic and hypertrophic toenails, 1 through 5 bilateral and clearing of subungual debris. No ulceration, no infection noted.  Return Visit-Office Procedure: Patient instructed to return to the office for a follow up visit 3 months for continued evaluation and treatment.    Gardiner Barefoot DPM

## 2018-10-31 ENCOUNTER — Ambulatory Visit: Payer: Medicare Other | Admitting: Podiatry

## 2018-11-08 ENCOUNTER — Other Ambulatory Visit: Payer: Self-pay | Admitting: Family Medicine

## 2018-12-09 ENCOUNTER — Other Ambulatory Visit: Payer: Self-pay | Admitting: Family Medicine

## 2018-12-12 ENCOUNTER — Ambulatory Visit (INDEPENDENT_AMBULATORY_CARE_PROVIDER_SITE_OTHER): Payer: Medicare Other

## 2018-12-12 ENCOUNTER — Ambulatory Visit: Payer: Medicare Other

## 2018-12-12 VITALS — BP 134/62 | HR 75 | Ht 68.0 in | Wt 199.6 lb

## 2018-12-12 DIAGNOSIS — Z0000 Encounter for general adult medical examination without abnormal findings: Secondary | ICD-10-CM | POA: Diagnosis not present

## 2018-12-12 NOTE — Progress Notes (Signed)
PCP notes:Last OV 10/06/2018   Health maintenance:  Eye exam-encouraged to call Dr. Gershon Crane office and schedule a diabetic eye exam   Abnormal screenings: MMSE score of 28   Patient concerns: None   Nurse concerns:none   Next PCP appt: 04/07/2019

## 2018-12-12 NOTE — Patient Instructions (Signed)
[name] , Thank you for taking time to come for your Medicare Wellness Visit. I appreciate your ongoing commitment to your health goals. Please review the following plan we discussed and let me know if I can assist you in the future.   These are the goals we discussed: Goals    . Patient Stated     Maintain current health staus       This is a list of the screening recommended for you and due dates:  Health Maintenance  Topic Date Due  . Eye exam for diabetics  12/03/2015  . Complete foot exam   03/04/2019  . Hemoglobin A1C  04/07/2019  . Tetanus Vaccine  02/21/2023  . Pneumonia vaccines  Completed  . Flu Shot  Discontinued   Preventive Care for Adults  A healthy lifestyle and preventive care can promote health and wellness. Preventive health guidelines for adults include the following key practices.  . A routine yearly physical is a good way to check with your health care provider about your health and preventive screening. It is a chance to share any concerns and updates on your health and to receive a thorough exam.  . Visit your dentist for a routine exam and preventive care every 6 months. Brush your teeth twice a day and floss once a day. Good oral hygiene prevents tooth decay and gum disease.  . The frequency of eye exams is based on your age, health, family medical history, use  of contact lenses, and other factors. Follow your health care provider's recommendations for frequency of eye exams.  . Eat a healthy diet. Foods like vegetables, fruits, whole grains, low-fat dairy products, and lean protein foods contain the nutrients you need without too many calories. Decrease your intake of foods high in solid fats, added sugars, and salt. Eat the right amount of calories for you. Get information about a proper diet from your health care provider, if necessary.  . Regular physical exercise is one of the most important things you can do for your health. Most adults should  get at least 150 minutes of moderate-intensity exercise (any activity that increases your heart rate and causes you to sweat) each week. In addition, most adults need muscle-strengthening exercises on 2 or more days a week.  Silver Sneakers may be a benefit available to you. To determine eligibility, you may visit the website: www.silversneakers.com or contact program at 6021012323 Mon-Fri between 8AM-8PM.   . Maintain a healthy weight. The body mass index (BMI) is a screening tool to identify possible weight problems. It provides an estimate of body fat based on height and weight. Your health care provider can find your BMI and can help you achieve or maintain a healthy weight.   For adults 20 years and older: ? A BMI below 18.5 is considered underweight. ? A BMI of 18.5 to 24.9 is normal. ? A BMI of 25 to 29.9 is considered overweight. ? A BMI of 30 and above is considered obese.   . Maintain normal blood lipids and cholesterol levels by exercising and minimizing your intake of saturated fat. Eat a balanced diet with plenty of fruit and vegetables. Blood tests for lipids and cholesterol should begin at age 59 and be repeated every 5 years. If your lipid or cholesterol levels are high, you are over 50, or you are at high risk for heart disease, you may need your cholesterol levels checked more frequently. Ongoing high lipid and cholesterol levels should be treated  with medicines if diet and exercise are not working.  . If you smoke, find out from your health care provider how to quit. If you do not use tobacco, please do not start.  . If you choose to drink alcohol, please do not consume more than 2 drinks per day. One drink is considered to be 12 ounces (355 mL) of beer, 5 ounces (148 mL) of wine, or 1.5 ounces (44 mL) of liquor.  . If you are 62-21 years old, ask your health care provider if you should take aspirin to prevent strokes.  . Use sunscreen. Apply sunscreen liberally and  repeatedly throughout the day. You should seek shade when your shadow is shorter than you. Protect yourself by wearing long sleeves, pants, a wide-brimmed hat, and sunglasses year round, whenever you are outdoors.  . Once a month, do a whole body skin exam, using a mirror to look at the skin on your back. Tell your health care provider of new moles, moles that have irregular borders, moles that are larger than a pencil eraser, or moles that have changed in shape or color.

## 2018-12-12 NOTE — Progress Notes (Addendum)
Subjective:   [name] is a 78 y.o. male who presents for Medicare Annual/Subsequent preventive examination.  Review of Systems:  No ROS.  Medicare Wellness Visit. Additional risk factors are reflected in the social history. Cardiac Risk Factors include: advanced age (>40mn, >>26women);male gender;diabetes mellitus Patient lives with wife in a 2 story home with a basement. They do not have any pets. He has 2 children. 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   Patient goes to bed around 11:30-1. Gets up around 8-8:30am. No CPAP. He gets up 2-3 times a night to go to the bathroom.    Objective:    Vitals: BP 134/62 (BP Location: Left Arm, Patient Position: Sitting, Cuff Size: Large)   Pulse 75   Ht 5' 8"  (1.727 m)   Wt 199 lb 9.6 oz (90.5 kg)   SpO2 94%   BMI 30.35 kg/m   Body mass index is 30.35 kg/m.  Advanced Directives 12/12/2018 05/24/2017 02/23/2016 11/30/2014  Does Patient Have a Medical Advance Directive? Yes No No No  Type of AParamedicof AStilwellLiving will - - -  Does patient want to make changes to medical advance directive? No - Patient declined - - -  Copy of [place]? No - copy requested - - -    Tobacco Social History   Tobacco Use  Smoking Status Former Smoker  Smokeless Tobacco Never Used  Tobacco Comment   Quit in 420s    Counseling given: Not Answered Comment: Quit in 462s     Past Medical History:  Diagnosis Date  . DIABETES MELLITUS, TYPE II 07/09/2007  . FATTY LIVER DISEASE 01/12/2009  . HYPERLIPIDEMIA 07/09/2007  . HYPERTENSION 07/09/2007   Past Surgical History:  Procedure Laterality Date  . Stress Cardiolite  08/23/2004   Family History  Problem Relation Age of Onset  . Congestive Heart Failure Mother   . Cirrhosis Father   . Cancer Neg Hx    Social  History   Socioeconomic History  . Marital status: Married    Spouse name: Not on file  . Number of children: Not on file  . Years of education: Not on file  . Highest education level: Not on file  Occupational History  . Occupation: Retired  SScientific laboratory technician . Financial resource strain: Not on file  . Food insecurity:    Worry: Not on file    Inability: Not on file  . Transportation needs:    Medical: Not on file    Non-medical: Not on file  Tobacco Use  . Smoking status: Former SResearch scientist (life sciences) . Smokeless tobacco: Never Used  . Tobacco comment: Quit in 445s Substance and Sexual Activity  . Alcohol use: No    Alcohol/week: 0.0 standard drinks  . Drug use: No  . Sexual activity: Not Currently  Lifestyle  . Physical activity:    Days per week: Not on file    Minutes per session: Not on file  . Stress: Not on file  Relationships  . Social connections:    Talks on phone: Not on file    Gets together: Not on file    Attends religious service: Not on file    Active member of club or organization: Not on file    Attends meetings of clubs or organizations: Not  on file    Relationship status: Not on file  Other Topics Concern  . Not on file  Social History Narrative  . Not on file    Outpatient Encounter Medications as of 12/12/2018  Medication Sig  . aspirin 81 MG tablet Take 81 mg by mouth daily.    Marland Kitchen atorvastatin (LIPITOR) 80 MG tablet Take 1 tablet (80 mg total) by mouth daily.  . fluticasone (FLONASE) 50 MCG/ACT nasal spray Place 2 sprays into both nostrils daily. 2 sprays in each nostrils as needed.  Marland Kitchen ipratropium (ATROVENT) 0.06 % nasal spray USE 2 SPRAYS IN EACH  NOSTRIL 4 TIMES DAILY  . losartan-hydrochlorothiazide (HYZAAR) 100-25 MG tablet TAKE 1 TABLET BY MOUTH ONCE DAILY  . metFORMIN (GLUCOPHAGE-XR) 500 MG 24 hr tablet Take 1 tablet (500 mg total) by mouth 2 (two) times daily.  . sitaGLIPtin (JANUVIA) 100 MG tablet Take 0.5 tablets (50 mg total) by mouth daily.  .  tamsulosin (FLOMAX) 0.4 MG CAPS capsule Take 0.4 mg by mouth.  . triamcinolone ointment (KENALOG) 0.5 % Apply 1 application topically 2 (two) times daily.   No facility-administered encounter medications on file as of 12/12/2018.     Activities of Daily Living In your present state of health, do you have any difficulty performing the following activities: 12/12/2018  Hearing? Y  Vision? N  Difficulty concentrating or making decisions? N  Walking or climbing stairs? N  Dressing or bathing? N  Doing errands, shopping? N  Preparing Food and eating ? N  Using the Toilet? N  In the past six months, have you accidently leaked urine? N  Do you have problems with loss of bowel control? N  Managing your Medications? N  Managing your Finances? N  Housekeeping or managing your Housekeeping? N  Some recent data might be hidden    Patient Care Team: Vivi Barrack, MD as PCP - General (Family Medicine) Rutherford Guys, MD as Consulting Physician (Ophthalmology)   Assessment:   This is a routine wellness examination for [name].  Exercise Activities and Dietary recommendations Current Exercise Habits: The patient does not participate in regular exercise at present  Breakfast: cereal, or he goes to [place] with his wife and they enjoy breakfast, drinks milk or soda  Lunch: [place] is his favorite. Likes [place] bar b q, Soda or tea  Dinner: Fish, vegetables, tea  Goals    . Patient Stated     Maintain current health staus        Depression Screen PHQ 2/9 Scores 12/12/2018 07/02/2018  PHQ - 2 Score 0 0    Cognitive Function MMSE - Mini Mental State Exam 12/12/2018  Orientation to time 5  Orientation to Place 5  Registration 3  Attention/ Calculation 3  Recall 3  Language- name 2 objects 2  Language- repeat 1  Language- follow 3 step command 3  Language- read & follow direction 1  Write a sentence 1  Copy design 1  Total score 28        Immunization History  Administered  Date(s) Administered  . Pneumococcal Conjugate-13 02/23/2016  . Pneumococcal Polysaccharide-23 11/15/2008  . Td 08/06/2003, 02/20/2013  . Zoster 12/27/2014      Screening Tests Health Maintenance  Topic Date Due  . OPHTHALMOLOGY EXAM  12/03/2015  . FOOT EXAM  03/04/2019  . HEMOGLOBIN A1C  04/07/2019  . TETANUS/TDAP  02/21/2023  . PNA vac Low Risk Adult  Completed  . INFLUENZA VACCINE  Discontinued    Plan:  Follow Up with PCP as Advised  I have personally reviewed and noted the following in the patient's chart:   . Medical and social history . Use of alcohol, tobacco or illicit drugs  . Current medications and supplements . Functional ability and status . Nutritional status . Physical activity . Advanced directives . List of other physicians . Vitals . Screenings to include cognitive, depression, and falls . Referrals and appointments  In addition, I have reviewed and discussed with patient certain preventive protocols, quality metrics, and best practice recommendations. A written personalized care plan for preventive services as well as general preventive health recommendations were provided to patient.     [place], LPN  06/08/3743  I have reviewed documentation for AWV and Advance Care planning provided by [facility], I agree with documentation, I was immediately available for any questions. Inda Coke, [place]

## 2018-12-17 ENCOUNTER — Other Ambulatory Visit: Payer: Self-pay

## 2018-12-17 MED ORDER — TRIAMCINOLONE ACETONIDE 0.5 % EX OINT: 1.0000 "application " | TOPICAL_OINTMENT | Freq: Two times a day (BID) | CUTANEOUS | 0 refills | Status: DC

## 2019-02-24 ENCOUNTER — Encounter: Payer: Self-pay | Admitting: Family Medicine

## 2019-02-24 ENCOUNTER — Other Ambulatory Visit: Payer: Self-pay

## 2019-02-24 ENCOUNTER — Ambulatory Visit (INDEPENDENT_AMBULATORY_CARE_PROVIDER_SITE_OTHER): Payer: Medicare Other | Admitting: Family Medicine

## 2019-02-24 VITALS — BP 114/74 | HR 96 | Temp 97.8°F | Ht 68.0 in | Wt 193.6 lb

## 2019-02-24 DIAGNOSIS — W57XXXA Bitten or stung by nonvenomous insect and other nonvenomous arthropods, initial encounter: Secondary | ICD-10-CM | POA: Diagnosis not present

## 2019-02-24 DIAGNOSIS — E1159 Type 2 diabetes mellitus with other circulatory complications: Secondary | ICD-10-CM | POA: Diagnosis not present

## 2019-02-24 DIAGNOSIS — L570 Actinic keratosis: Secondary | ICD-10-CM | POA: Diagnosis not present

## 2019-02-24 DIAGNOSIS — I10 Essential (primary) hypertension: Secondary | ICD-10-CM

## 2019-02-24 DIAGNOSIS — S30860A Insect bite (nonvenomous) of lower back and pelvis, initial encounter: Secondary | ICD-10-CM | POA: Diagnosis not present

## 2019-02-24 DIAGNOSIS — I152 Hypertension secondary to endocrine disorders: Secondary | ICD-10-CM

## 2019-02-24 MED ORDER — DOXYCYCLINE HYCLATE 100 MG PO TABS: 200.0000 mg | ORAL_TABLET | Freq: Once | ORAL | 0 refills | Status: AC

## 2019-02-24 NOTE — Patient Instructions (Signed)
It was very nice to see you today!  We removed your tick today.  Please take the doxycycline.  Please let me know if you have any rash, fevers, chills, or body aches.  We froze a few spots today.  Please let me know if you have any signs concerning for infection.  I will see you back in a couple of months for your regular checkup.  Come back to see me sooner if needed.  Take care, Dr [name]

## 2019-02-24 NOTE — Progress Notes (Signed)
   Chief Complaint:  [name] is a 78 y.o. male who presents for same day appointment with a chief complaint of tick bite.   Assessment/Plan:  Tick  Bite Successfully removed today.  No signs of complication.  Will give a prophylactic dose of 200 mg doxycycline once.  Discussed reasons to return to care.  Hypertension associated with diabetes ([place]) At goal.  Continue losartan-HCTZ 100-25 mg tablet once daily.  Actinic keratosis Cryotherapy applied today.  See below procedure note.  Patient tolerated well.     Subjective:  HPI:  Tick Bite Patient was mowing the grass 5 days ago.  He thinks that the tick became attached then.  Tick is currently attached to his right groin.  He has tried removing it at home however was unsuccessful.  No rash.  No fevers or chills.  No body aches.   # Essential Hypertension - On losartan-HCTZ 100-39m once daily and tolerating well - ROS: No reported chest pain or shortness of breath.   # Actinic Keratosis - Located on right ear. Had cryotherapy few months ago and has not noticed any significant change.  -Has also noticed a few new lesions on right forearm, right hand, and left hand. No home treatments tried. No obvious alleviating or aggravating factors.   ROS: Per HPI  PMH: He reports that he has quit smoking. He has never used smokeless tobacco. He reports that he does not drink alcohol or use drugs.      Objective:  Physical Exam: BP 114/74 (BP Location: Left Arm, Patient Position: Sitting, Cuff Size: Normal)   Pulse 96   Temp 97.8 F (36.6 C) (Oral)   Ht 5' 8"  (1.727 m)   Wt 193 lb 9.6 oz (87.8 kg)   SpO2 98%   BMI 29.44 kg/m   Gen: NAD, resting comfortably Skin: Small 2 to 3 mm tick attached to right groin.  Several actinic keratoses noted on right ear, right upper extremity, and left upper extremity.  Verbal consent was obtained for tick removal.  The tick was grasped near the head using a pair of forceps and then was gently  removed.  All mouthparts were intact, upon inspection of the tick after removal.  Area which is bandaged with antibacterial ointment and a Band-Aid.  Cryotherapy Procedure Note  Pre-operative Diagnosis: Actinic keratosis  Locations: Right ear, bilateral upper extremities.  Indications: Therapeutic  Procedure Details  Patient informed of risks (permanent scarring, infection, light or dark discoloration, bleeding, infection, weakness, numbness and recurrence of the lesion) and benefits of the procedure and verbal informed consent obtained.  The areas are treated with liquid nitrogen therapy, frozen until ice ball extended 3 mm beyond lesion, allowed to thaw, and treated again.  A total of 5 lesions were treated.  The patient tolerated procedure well.  The patient was instructed on post-op care, warned that there may be blister formation, redness and pain. Recommend OTC analgesia as needed for pain.  Condition: Stable  Complications: none.      CAlgis Greenhouse PJerline Pain MD 02/24/2019 9:07 AM

## 2019-02-24 NOTE — Assessment & Plan Note (Signed)
At goal.  Continue losartan-HCTZ 100-25 mg tablet once daily.

## 2019-02-24 NOTE — Assessment & Plan Note (Signed)
Cryotherapy applied today.  See below procedure note.  Patient tolerated well.

## 2019-03-06 ENCOUNTER — Other Ambulatory Visit (HOSPITAL_COMMUNITY): Payer: Self-pay | Admitting: Urology

## 2019-03-06 ENCOUNTER — Other Ambulatory Visit: Payer: Self-pay | Admitting: Urology

## 2019-03-06 DIAGNOSIS — C61 Malignant neoplasm of prostate: Secondary | ICD-10-CM

## 2019-03-23 NOTE — Progress Notes (Addendum)
GU Location of Tumor / Histology: prostatic adenocarcinoma  If Prostate Cancer, Gleason Score is (3 + 4) and PSA is (7.94). Prostate volume: 80 cm^3  [name] presented with a history of an elevated PSA and BPH with LUTS.  Biopsies of prostate (if applicable) revealed:    Past/Anticipated interventions by urology, if any: prostate biopsy, CT abd/pelvis (negative), bone scan (negative), referral for consideration of radiotherapy  Past/Anticipated interventions by medical oncology, if any: no  Weight changes, if any: no  Bowel/Bladder complaints, if any: IPSS 12. SHIM 1. Denies dysuria, hematuria, leakage or incontinence.  Nausea/Vomiting, if any: no  Pain issues, if any:  Reports low back pain more than normal. Schmorl's node noted at L5 on bone scan.  SAFETY ISSUES:  Prior radiation? no  Pacemaker/ICD? no  Possible current pregnancy? no, male patient  Is the patient on methotrexate? no  Current Complaints / other details:  78 year old male. Married with daughter. NKDA. Stopped smoking 07/1986. Retired.

## 2019-03-24 ENCOUNTER — Ambulatory Visit
Admission: RE | Admit: 2019-03-24 | Discharge: 2019-03-24 | Disposition: A | Discharge status: Home / Self Care | Payer: Medicare Other | Source: Ambulatory Visit | Attending: Radiation Oncology | Admitting: Radiation Oncology

## 2019-03-24 ENCOUNTER — Other Ambulatory Visit: Payer: Self-pay

## 2019-03-24 ENCOUNTER — Encounter: Payer: Self-pay | Admitting: Radiation Oncology

## 2019-03-24 ENCOUNTER — Encounter (HOSPITAL_COMMUNITY)
Admission: RE | Admit: 2019-03-24 | Discharge: 2019-03-24 | Disposition: A | Discharge status: Home / Self Care | Payer: Medicare Other | Source: Ambulatory Visit | Attending: Urology | Admitting: Urology

## 2019-03-24 VITALS — Ht 68.0 in | Wt 200.0 lb

## 2019-03-24 DIAGNOSIS — C61 Malignant neoplasm of prostate: Secondary | ICD-10-CM

## 2019-03-24 HISTORY — DX: Malignant neoplasm of prostate: C61

## 2019-03-24 MED ORDER — TECHNETIUM TC 99M MEDRONATE IV KIT
20.0000 | PACK | Freq: Once | INTRAVENOUS | Status: AC | PRN
  Administered 2019-03-24: 20.5 via INTRAVENOUS

## 2019-03-24 NOTE — Progress Notes (Signed)
Radiation Oncology         [phone] ________________________________  Initial Outpatient Consultation - Conducted via telephone due to current COVID-19 concerns for limiting patient exposure  Name: [name] MRN: [id number]  Date: 03/24/2019  DOB: July 26, 1941  ZD:[id number], Algis Greenhouse, MD  Davis Gourd*   REFERRING PHYSICIAN: Davis Gourd*  DIAGNOSIS: 78 y.o. gentleman with Stage T1c adenocarcinoma of the prostate with Gleason score of 3+4 and PSA of 7.94     ICD-10-CM   1. Malignant neoplasm of prostate ([place]) C61     HISTORY OF PRESENT ILLNESS: [name] is a 78 y.o. male with a diagnosis of prostate cancer. He is an established urology patient with a history of elevated PSA (values noted below) and BPH with LUTS dating back to at least 2016.  His LUTS are improved on daily Flomax which he has continued to take as prescribed.  On follow-up with Dr. Lovena Neighbours in 01/2018, his PSA remained elevated at 6.96; a digital rectal examination was performed revealing no prostate nodules.  He elected to continue to monitor the PSA and a repeat PSA in 06/2018 was stable at 5.56. However, his PSA rose to 7.94 in March 2020. The patient proceeded to transrectal ultrasound with 12 biopsies of the prostate on 02/26/2019.  The prostate volume measured 80.1 cc.  Out of 12 core biopsies, 5 were positive.  The maximum Gleason score was 3+4, and this was seen in the left apex, right base, and right base lateral. Additionally, there was Gleason 3+3 disease seen in the the left base lateral and left mid lateral.   PSA History: 01/2019: PSA 7.94 07/2018: PSA 5.56 01/2018: PSA 6.96 01/2017: PSA 5.30 05/2016: PSA 5.78 12/2014: PSA 4.48  Biopsies of prostate revealed:    Staging CT A/P 03/12/19 was negative for lymphadenopathy or evidence of metastatic disease in the abdomen or pelvis.  There was a small, 8 mm sclerotic lesion in the right femoral head felt most likely to be a bone island. A  Bone scan on 03/24/2019 showed no evidence of osseous metastatic disease but there was faint uptake along the right aspect of the L5 vertebral body corresponding to a Schmorl's node seen on recent CT.    The patient reviewed the biopsy results with his urologist and he has kindly been referred today for discussion of potential radiation treatment options.  PREVIOUS RADIATION THERAPY: No  PAST MEDICAL HISTORY:  Past Medical History:  Diagnosis Date   DIABETES MELLITUS, TYPE II 07/09/2007   FATTY LIVER DISEASE 01/12/2009   HYPERLIPIDEMIA 07/09/2007   HYPERTENSION 07/09/2007   Prostate cancer ([place])       PAST SURGICAL HISTORY: Past Surgical History:  Procedure Laterality Date   prostate biopsy     Stress Cardiolite  08/23/2004    FAMILY HISTORY:  Family History  Problem Relation Age of Onset   Congestive Heart Failure Mother    Cirrhosis Father    Cancer Neg Hx     SOCIAL HISTORY:  Social History   Socioeconomic History   Marital status: Married    Spouse name: Not on file   Number of children: 2   Years of education: Not on file   Highest education level: Not on file  Occupational History   Occupation: Retired  Scientist, product/process development strain: Not on file   Food insecurity:    Worry: Not on file    Inability: Not on file   Transportation needs:    Medical:  Not on file    Non-medical: Not on file  Tobacco Use   Smoking status: Former Smoker    Packs/day: 1.00    Years: 15.00    Pack years: 15.00    Types: Cigarettes    Last attempt to quit: 11/05/1981    Years since quitting: 37.4   Smokeless tobacco: Never Used   Tobacco comment: Quit in 70s  Substance and Sexual Activity   Alcohol use: No    Alcohol/week: 0.0 standard drinks   Drug use: No   Sexual activity: Not Currently  Lifestyle   Physical activity:    Days per week: Not on file    Minutes per session: Not on file   Stress: Not on file  Relationships   Social  connections:    Talks on phone: Not on file    Gets together: Not on file    Attends religious service: Not on file    Active member of club or organization: Not on file    Attends meetings of clubs or organizations: Not on file    Relationship status: Not on file   Intimate partner violence:    Fear of current or ex partner: Not on file    Emotionally abused: Not on file    Physically abused: Not on file    Forced sexual activity: Not on file  Other Topics Concern   Not on file  Social History Narrative   Not on file    ALLERGIES: Lovastatin and Simvastatin  MEDICATIONS:  Current Outpatient Medications  Medication Sig Dispense Refill   aspirin 81 MG tablet Take 81 mg by mouth daily.       atorvastatin (LIPITOR) 80 MG tablet Take 1 tablet (80 mg total) by mouth daily. 90 tablet 3   fluticasone (FLONASE) 50 MCG/ACT nasal spray Place 2 sprays into both nostrils daily. 2 sprays in each nostrils as needed. 48 g 3   ipratropium (ATROVENT) 0.06 % nasal spray USE 2 SPRAYS IN EACH  NOSTRIL 4 TIMES DAILY 15 mL 0   losartan-hydrochlorothiazide (HYZAAR) 100-25 MG tablet TAKE 1 TABLET BY MOUTH ONCE DAILY 90 tablet 3   metFORMIN (GLUCOPHAGE-XR) 500 MG 24 hr tablet Take 1 tablet (500 mg total) by mouth 2 (two) times daily. 180 tablet 3   sitaGLIPtin (JANUVIA) 100 MG tablet Take 0.5 tablets (50 mg total) by mouth daily. 45 tablet 4   tamsulosin (FLOMAX) 0.4 MG CAPS capsule Take 0.4 mg by mouth.     triamcinolone ointment (KENALOG) 0.5 % Apply 1 application topically 2 (two) times daily. 30 g 0   No current facility-administered medications for this encounter.     REVIEW OF SYSTEMS:  On review of systems, the patient reports that he is doing well overall. He denies any chest pain, shortness of breath, cough, fevers, chills, night sweats, or unintended weight changes. He denies any bowel disturbances, and denies abdominal pain, nausea or vomiting. He reports low back pain, more than  normal. His IPSS was 12, indicating moderate urinary symptoms despite taking daily Flomax. He denies dysuria, hematuria, leakage or incontinence. His SHIM was 1, indicating he does have severe erectile dysfunction. A complete review of systems is obtained and is otherwise negative.  PHYSICAL EXAM: Not performed in light of telephone consultation.  Wt Readings from Last 3 Encounters:  03/24/19 200 lb (90.7 kg)  02/24/19 193 lb 9.6 oz (87.8 kg)  12/12/18 199 lb 9.6 oz (90.5 kg)   Temp Readings from Last 3 Encounters:  02/24/19 97.8 F (36.6 C) (Oral)  10/06/18 (!) 97.5 F (36.4 C) (Oral)  07/02/18 (!) 97.5 F (36.4 C) (Oral)   BP Readings from Last 3 Encounters:  02/24/19 114/74  12/12/18 134/62  10/06/18 108/70   Pulse Readings from Last 3 Encounters:  02/24/19 96  12/12/18 75  10/06/18 68   Pain Assessment Pain Score: 0-No pain/10   LABORATORY DATA:  Lab Results  Component Value Date   WBC 7.3 10/06/2018   HGB 15.1 10/06/2018   HCT 45.0 10/06/2018   MCV 91.5 10/06/2018   PLT 215.0 10/06/2018   Lab Results  Component Value Date   NA 139 10/06/2018   K 4.2 10/06/2018   CL 103 10/06/2018   CO2 28 10/06/2018   Lab Results  Component Value Date   ALT 48 10/06/2018   AST 30 10/06/2018   ALKPHOS 86 10/06/2018   BILITOT 0.7 10/06/2018     RADIOGRAPHY: Nm Bone Scan Whole Body  Result Date: 03/24/2019 CLINICAL DATA:  Recent prostate cancer diagnosis. Evaluate for osseous metastatic disease. EXAM: NUCLEAR MEDICINE WHOLE BODY BONE SCAN TECHNIQUE: Whole body anterior and posterior images were obtained approximately 3 hours after intravenous injection of radiopharmaceutical. RADIOPHARMACEUTICALS:  20.5 mCi Technetium-51mMDP IV COMPARISON:  CT abdomen pelvis dated Mar 12, 2019. FINDINGS: There are no foci of increased or decreased radiotracer uptake to suggest osseous metastatic disease. Faint uptake along the right aspect of the L5 vertebral body corresponds to a Schmorl's  node seen on recent CT. There is degenerative type uptake in the shoulders, knees, and ankles. Normal physiologic activity is identified within the kidneys and urinary bladder. IMPRESSION: 1. No evidence of osseous metastatic disease. Electronically Signed   By: WTitus DubinM.D.   On: 03/24/2019 13:28      IMPRESSION/PLAN: This visit was conducted via telephone to spare the patient unnecessary potential exposure in the healthcare setting during the current COVID-19 pandemic. 1. 78y.o. gentleman with Stage T1c adenocarcinoma of the prostate with Gleason Score of 3+4, and PSA of 7.94. We discussed the patient's workup and outlined the nature of prostate cancer in this setting. The patient's T stage, Gleason's score, and PSA put him into the favorable-intermediate risk group. Accordingly, he is eligible for 5.5 weeks of external radiation. We discussed the available radiation techniques, and focused on the details and logistics and delivery.  He is not a good candidate for brachii therapy given his large gland volume and moderate LUTS despite already taking Flomax.  Therefore, our recommendation would be to proceed with a 5-1/2-week course of daily external beam radiotherapy.  We discussed and outlined the risks, benefits, short and long-term effects associated with radiotherapy and compared and contrasted these with prostatectomy. We also discussed the role of SpaceOAR in reducing the rectal toxicity associated with radiotherapy.  At the end of the conversation, the patient is interested in moving forward with 5.5 weeks of external beam therapy. We will share our discussion with Dr. WLovena Neighboursand move forward with scheduling for fiducial marker placement with SpaceOAR to reduce rectal toxicity from radiotherapy prior to simulation. He will be scheduled for CT simulation for treatment planning in the near future.   Given current concerns for patient exposure during the COVID-19 pandemic, this encounter was  conducted via telephone. The patient was notified in advance and was offered a WStephenmeeting to allow for face to face communication but unfortunately reported that he did not have the appropriate resources/technology to support such a visit and  instead preferred to proceed with telephone consult. The patient has given verbal consent for this type of encounter. The time spent during this encounter was 60 minutes. The attendants for this meeting include Tyler Pita MD, Ashlyn Bruning PA-C, Rae Lips- scribe, patient, Dandra Velardi. During the encounter, Tyler Pita MD, Ashlyn Bruning PA-C, and scribe, Rae Lips were located at [facility] Radiation Oncology Department.  Patient, [name] was located at home.    Nicholos Johns, PA-C    Tyler Pita, MD  [facility] Direct Dial: [phone]   Fax: [phone] [email or web address]   Skype   LinkedIn  This document serves as a record of services personally performed by Tyler Pita, MD and Freeman Caldron, PA-C. It was created on their behalf by Rae Lips, a trained medical scribe. The creation of this record is based on the scribe's personal observations and the providers' statements to them. This document has been checked and approved by the attending providers.

## 2019-03-24 NOTE — Progress Notes (Signed)
See progress note under physician encounter. 

## 2019-03-26 DIAGNOSIS — C61 Malignant neoplasm of prostate: Secondary | ICD-10-CM | POA: Insufficient documentation

## 2019-04-07 ENCOUNTER — Ambulatory Visit: Payer: Medicare Other | Admitting: Family Medicine

## 2019-04-07 ENCOUNTER — Telehealth: Payer: Self-pay | Admitting: Medical Oncology

## 2019-04-07 NOTE — Telephone Encounter (Signed)
Spoke with [name] to introduce myself as the prostate nurse navigator and discuss my role. I was unable to meet him 5/19 when he consulted with Dr. Tammi Klippel due to visit was held via telehealth. He was disappointed he could not get WebEx to work but glad he could do by telephone. He states the consult went well and he has chosen to do 5 1/2 weeks of external beam radiation. He had a follow up with Dr. Lovena Neighbours 5/27 and has requested the fiducial markers and SpaceOar be placed under anesthesia. He has not been contacted with a date. I informed him I am working remotely and asked him to call me with questions or concerns. He voiced understanding.

## 2019-04-14 ENCOUNTER — Other Ambulatory Visit: Payer: Self-pay | Admitting: Urology

## 2019-04-14 DIAGNOSIS — C61 Malignant neoplasm of prostate: Secondary | ICD-10-CM

## 2019-05-01 ENCOUNTER — Encounter: Payer: Self-pay | Admitting: Podiatry

## 2019-05-01 ENCOUNTER — Ambulatory Visit: Payer: Medicare Other | Admitting: Podiatry

## 2019-05-01 ENCOUNTER — Other Ambulatory Visit: Payer: Self-pay

## 2019-05-01 DIAGNOSIS — M79674 Pain in right toe(s): Secondary | ICD-10-CM | POA: Diagnosis not present

## 2019-05-01 DIAGNOSIS — M79675 Pain in left toe(s): Secondary | ICD-10-CM

## 2019-05-01 DIAGNOSIS — E119 Type 2 diabetes mellitus without complications: Secondary | ICD-10-CM | POA: Diagnosis not present

## 2019-05-01 DIAGNOSIS — B351 Tinea unguium: Secondary | ICD-10-CM | POA: Insufficient documentation

## 2019-05-01 NOTE — Progress Notes (Signed)
Complaint:  Visit Type: Patient returns to my office for continued preventative foot care services. Complaint: Patient states" my nails have grown long and thick and become painful to walk and wear shoes" Patient has been diagnosed with DM with no foot complications. The patient presents for preventative foot care services. No changes to ROS  Podiatric Exam: Vascular: dorsalis pedis and posterior tibial pulses are palpable bilateral. Capillary return is immediate. Temperature gradient is WNL. Skin turgor WNL  Sensorium: Normal Semmes Weinstein monofilament test. Normal tactile sensation bilaterally. Nail Exam: Pt has thick disfigured discolored nails with subungual debris noted bilateral entire nail hallux through fifth toenails Ulcer Exam: There is no evidence of ulcer or pre-ulcerative changes or infection. Orthopedic Exam: Muscle tone and strength are WNL. No limitations in general ROM. No crepitus or effusions noted. Foot type and digits show no abnormalities. Bony prominences are unremarkable. Skin: No Porokeratosis. No infection or ulcers  Diagnosis:  Onychomycosis, , Pain in right toe, pain in left toes  Treatment & Plan Procedures and Treatment: Consent by patient was obtained for treatment procedures.   Debridement of mycotic and hypertrophic toenails, 1 through 5 bilateral and clearing of subungual debris. No ulceration, no infection noted.  Return Visit-Office Procedure: Patient instructed to return to the office for a follow up visit 4 months for continued evaluation and treatment.    Gardiner Barefoot DPM

## 2019-05-16 ENCOUNTER — Other Ambulatory Visit (HOSPITAL_COMMUNITY)
Admission: RE | Admit: 2019-05-16 | Discharge: 2019-05-16 | Disposition: A | Discharge status: Home / Self Care | Payer: Medicare Other | Source: Ambulatory Visit | Attending: Urology | Admitting: Urology

## 2019-05-16 DIAGNOSIS — Z1159 Encounter for screening for other viral diseases: Secondary | ICD-10-CM | POA: Diagnosis not present

## 2019-05-16 DIAGNOSIS — Z01812 Encounter for preprocedural laboratory examination: Secondary | ICD-10-CM | POA: Diagnosis present

## 2019-05-16 LAB — SARS CORONAVIRUS 2 (TAT 6-24 HRS): SARS Coronavirus 2: NEGATIVE

## 2019-05-19 ENCOUNTER — Encounter (HOSPITAL_BASED_OUTPATIENT_CLINIC_OR_DEPARTMENT_OTHER): Payer: Self-pay | Admitting: *Deleted

## 2019-05-19 ENCOUNTER — Other Ambulatory Visit: Payer: Self-pay

## 2019-05-19 NOTE — Progress Notes (Signed)
Spoke w/ pt via phone for pre-op interview.  Npo after mn.  Arrive at 1015.  Needs istat 8 and ekg.  Pt had covid test done 05-16-2019.  Pt verbalized understanding to do one fleet enema am dos.

## 2019-05-19 NOTE — Progress Notes (Signed)
SPOKE W/  _  Pt via phone     SCREENING SYMPTOMS OF COVID 19:   COUGH--  no  RUNNY NOSE--- no  SORE THROAT--- no  NASAL CONGESTION---- no  SNEEZING---- no  SHORTNESS OF BREATH--- no  DIFFICULTY BREATHING--- no  TEMP >100.0 ----- no  UNEXPLAINED BODY ACHES------ no  CHILLS --------  no  HEADACHES --------- no  LOSS OF SMELL/ TASTE -------- no    HAVE YOU OR ANY FAMILY MEMBER TRAVELLED PAST 14 DAYS OUT OF THE   COUNTY--- no STATE---- no COUNTRY---- no  HAVE YOU OR ANY FAMILY MEMBER BEEN EXPOSED TO ANYONE WITH COVID 19?   denies

## 2019-05-20 ENCOUNTER — Ambulatory Visit (HOSPITAL_BASED_OUTPATIENT_CLINIC_OR_DEPARTMENT_OTHER)
Admission: RE | Admit: 2019-05-20 | Discharge: 2019-05-20 | Disposition: A | Discharge status: Home / Self Care | Payer: Medicare Other | Attending: Urology | Admitting: Urology

## 2019-05-20 ENCOUNTER — Ambulatory Visit (HOSPITAL_BASED_OUTPATIENT_CLINIC_OR_DEPARTMENT_OTHER): Payer: Medicare Other | Admitting: Anesthesiology

## 2019-05-20 ENCOUNTER — Encounter (HOSPITAL_BASED_OUTPATIENT_CLINIC_OR_DEPARTMENT_OTHER): Payer: Self-pay | Admitting: *Deleted

## 2019-05-20 ENCOUNTER — Other Ambulatory Visit: Payer: Self-pay

## 2019-05-20 ENCOUNTER — Encounter (HOSPITAL_BASED_OUTPATIENT_CLINIC_OR_DEPARTMENT_OTHER)
Admission: RE | Disposition: A | Discharge status: Home / Self Care | Payer: Self-pay | Source: Home / Self Care | Attending: Urology

## 2019-05-20 DIAGNOSIS — Z8669 Personal history of other diseases of the nervous system and sense organs: Secondary | ICD-10-CM | POA: Diagnosis not present

## 2019-05-20 DIAGNOSIS — Z888 Allergy status to other drugs, medicaments and biological substances status: Secondary | ICD-10-CM | POA: Insufficient documentation

## 2019-05-20 DIAGNOSIS — C61 Malignant neoplasm of prostate: Secondary | ICD-10-CM | POA: Insufficient documentation

## 2019-05-20 DIAGNOSIS — K7581 Nonalcoholic steatohepatitis (NASH): Secondary | ICD-10-CM | POA: Diagnosis not present

## 2019-05-20 DIAGNOSIS — E785 Hyperlipidemia, unspecified: Secondary | ICD-10-CM | POA: Diagnosis not present

## 2019-05-20 DIAGNOSIS — N401 Enlarged prostate with lower urinary tract symptoms: Secondary | ICD-10-CM | POA: Insufficient documentation

## 2019-05-20 DIAGNOSIS — Z8249 Family history of ischemic heart disease and other diseases of the circulatory system: Secondary | ICD-10-CM | POA: Diagnosis not present

## 2019-05-20 DIAGNOSIS — N183 Chronic kidney disease, stage 3 (moderate): Secondary | ICD-10-CM | POA: Diagnosis not present

## 2019-05-20 DIAGNOSIS — I129 Hypertensive chronic kidney disease with stage 1 through stage 4 chronic kidney disease, or unspecified chronic kidney disease: Secondary | ICD-10-CM | POA: Insufficient documentation

## 2019-05-20 DIAGNOSIS — I4892 Unspecified atrial flutter: Secondary | ICD-10-CM | POA: Diagnosis not present

## 2019-05-20 DIAGNOSIS — I4891 Unspecified atrial fibrillation: Secondary | ICD-10-CM | POA: Insufficient documentation

## 2019-05-20 DIAGNOSIS — Z8379 Family history of other diseases of the digestive system: Secondary | ICD-10-CM | POA: Insufficient documentation

## 2019-05-20 DIAGNOSIS — E1122 Type 2 diabetes mellitus with diabetic chronic kidney disease: Secondary | ICD-10-CM | POA: Insufficient documentation

## 2019-05-20 HISTORY — DX: Male erectile dysfunction, unspecified: N52.9

## 2019-05-20 HISTORY — DX: Benign prostatic hyperplasia with lower urinary tract symptoms: N40.1

## 2019-05-20 HISTORY — DX: Essential (primary) hypertension: I10

## 2019-05-20 HISTORY — DX: Type 2 diabetes mellitus without complications: E11.9

## 2019-05-20 HISTORY — DX: Hyperlipidemia, unspecified: E78.5

## 2019-05-20 HISTORY — PX: GOLD SEED IMPLANT: SHX6343

## 2019-05-20 HISTORY — DX: Other specified disorders of nose and nasal sinuses: J34.89

## 2019-05-20 HISTORY — PX: SPACE OAR INSTILLATION: SHX6769

## 2019-05-20 HISTORY — DX: Chronic kidney disease, stage 3 unspecified: N18.30

## 2019-05-20 HISTORY — DX: Personal history of other diseases of the nervous system and sense organs: Z86.69

## 2019-05-20 HISTORY — DX: Personal history of other medical treatment: Z92.89

## 2019-05-20 LAB — POCT I-STAT, CHEM 8
BUN: 31 mg/dL — ABNORMAL HIGH (ref 8–23)
Calcium, Ion: 1.26 mmol/L (ref 1.15–1.40)
Chloride: 104 mmol/L (ref 98–111)
Creatinine, Ser: 1.7 mg/dL — ABNORMAL HIGH (ref 0.61–1.24)
Glucose, Bld: 117 mg/dL — ABNORMAL HIGH (ref 70–99)
HCT: 43 % (ref 39.0–52.0)
Hemoglobin: 14.6 g/dL (ref 13.0–17.0)
Potassium: 3.9 mmol/L (ref 3.5–5.1)
Sodium: 138 mmol/L (ref 135–145)
TCO2: 22 mmol/L (ref 22–32)

## 2019-05-20 LAB — GLUCOSE, CAPILLARY: Glucose-Capillary: 109 mg/dL — ABNORMAL HIGH (ref 70–99)

## 2019-05-20 SURGERY — INSERTION, GOLD SEEDS
Anesthesia: General | Site: Prostate

## 2019-05-20 MED ORDER — PROPOFOL 10 MG/ML IV BOLUS
INTRAVENOUS | Status: AC
  Filled 2019-05-20: qty 20

## 2019-05-20 MED ORDER — ONDANSETRON HCL 4 MG/2ML IJ SOLN
INTRAMUSCULAR | Status: DC | PRN
  Administered 2019-05-20: 4 mg via INTRAVENOUS

## 2019-05-20 MED ORDER — DEXAMETHASONE SODIUM PHOSPHATE 10 MG/ML IJ SOLN
INTRAMUSCULAR | Status: AC
  Filled 2019-05-20: qty 1

## 2019-05-20 MED ORDER — EPHEDRINE SULFATE-NACL 50-0.9 MG/10ML-% IV SOSY
PREFILLED_SYRINGE | INTRAVENOUS | Status: DC | PRN
  Administered 2019-05-20 (×2): 10 mg via INTRAVENOUS

## 2019-05-20 MED ORDER — PROPOFOL 10 MG/ML IV BOLUS
INTRAVENOUS | Status: DC | PRN
  Administered 2019-05-20: 150 mg via INTRAVENOUS
  Administered 2019-05-20: 30 mg via INTRAVENOUS

## 2019-05-20 MED ORDER — PHENYLEPHRINE 40 MCG/ML (10ML) SYRINGE FOR IV PUSH (FOR BLOOD PRESSURE SUPPORT)
PREFILLED_SYRINGE | INTRAVENOUS | Status: AC
  Filled 2019-05-20: qty 10

## 2019-05-20 MED ORDER — FENTANYL CITRATE (PF) 100 MCG/2ML IJ SOLN
INTRAMUSCULAR | Status: AC
  Filled 2019-05-20: qty 2

## 2019-05-20 MED ORDER — EPHEDRINE 5 MG/ML INJ
INTRAVENOUS | Status: AC
  Filled 2019-05-20: qty 10

## 2019-05-20 MED ORDER — PROMETHAZINE HCL 25 MG/ML IJ SOLN
6.2500 mg | INTRAMUSCULAR | Status: DC | PRN
  Filled 2019-05-20: qty 1

## 2019-05-20 MED ORDER — ONDANSETRON HCL 4 MG/2ML IJ SOLN
INTRAMUSCULAR | Status: AC
  Filled 2019-05-20: qty 2

## 2019-05-20 MED ORDER — LIDOCAINE 2% (20 MG/ML) 5 ML SYRINGE
INTRAMUSCULAR | Status: AC
  Filled 2019-05-20: qty 5

## 2019-05-20 MED ORDER — ARTIFICIAL TEARS OPHTHALMIC OINT
TOPICAL_OINTMENT | OPHTHALMIC | Status: AC
  Filled 2019-05-20: qty 3.5

## 2019-05-20 MED ORDER — PHENYLEPHRINE 40 MCG/ML (10ML) SYRINGE FOR IV PUSH (FOR BLOOD PRESSURE SUPPORT)
PREFILLED_SYRINGE | INTRAVENOUS | Status: DC | PRN
  Administered 2019-05-20: 80 ug via INTRAVENOUS
  Administered 2019-05-20: 40 ug via INTRAVENOUS
  Administered 2019-05-20: 80 ug via INTRAVENOUS

## 2019-05-20 MED ORDER — LIDOCAINE 2% (20 MG/ML) 5 ML SYRINGE
INTRAMUSCULAR | Status: DC | PRN
  Administered 2019-05-20: 80 mg via INTRAVENOUS
  Administered 2019-05-20: 20 mg via INTRAVENOUS

## 2019-05-20 MED ORDER — DEXAMETHASONE SODIUM PHOSPHATE 4 MG/ML IJ SOLN
INTRAMUSCULAR | Status: DC | PRN
  Administered 2019-05-20: 5 mg via INTRAVENOUS

## 2019-05-20 MED ORDER — HYDROMORPHONE HCL 1 MG/ML IJ SOLN
0.2500 mg | INTRAMUSCULAR | Status: DC | PRN
  Filled 2019-05-20: qty 0.5

## 2019-05-20 MED ORDER — CEFAZOLIN SODIUM-DEXTROSE 2-4 GM/100ML-% IV SOLN
INTRAVENOUS | Status: AC
  Filled 2019-05-20: qty 100

## 2019-05-20 MED ORDER — FENTANYL CITRATE (PF) 100 MCG/2ML IJ SOLN
INTRAMUSCULAR | Status: DC | PRN
  Administered 2019-05-20 (×2): 25 ug via INTRAVENOUS
  Administered 2019-05-20: 50 ug via INTRAVENOUS

## 2019-05-20 MED ORDER — SODIUM CHLORIDE 0.9 % IV SOLN
INTRAVENOUS | Status: DC
  Administered 2019-05-20: 50 mL/h via INTRAVENOUS
  Administered 2019-05-20: 12:00:00 via INTRAVENOUS
  Filled 2019-05-20: qty 1000

## 2019-05-20 MED ORDER — OXYCODONE HCL 5 MG PO TABS
5.0000 mg | ORAL_TABLET | Freq: Once | ORAL | Status: DC | PRN
  Filled 2019-05-20: qty 1

## 2019-05-20 MED ORDER — CEFAZOLIN SODIUM-DEXTROSE 2-4 GM/100ML-% IV SOLN
2.0000 g | Freq: Once | INTRAVENOUS | Status: AC
  Administered 2019-05-20: 2 g via INTRAVENOUS
  Filled 2019-05-20: qty 100

## 2019-05-20 MED ORDER — OXYCODONE HCL 5 MG/5ML PO SOLN
5.0000 mg | Freq: Once | ORAL | Status: DC | PRN
  Filled 2019-05-20: qty 5

## 2019-05-20 MED ORDER — FLEET ENEMA 7-19 GM/118ML RE ENEM
1.0000 | ENEMA | Freq: Once | RECTAL | Status: DC
  Filled 2019-05-20: qty 1

## 2019-05-20 SURGICAL SUPPLY — 15 items
COVER TABLE BACK 60X90 (DRAPES) ×2 IMPLANT
DRSG TEGADERM 4X4.75 (GAUZE/BANDAGES/DRESSINGS) ×2 IMPLANT
DRSG TEGADERM 8X12 (GAUZE/BANDAGES/DRESSINGS) ×4 IMPLANT
GAUZE SPONGE 4X4 12PLY STRL (GAUZE/BANDAGES/DRESSINGS) ×2 IMPLANT
GLOVE BIO SURGEON STRL SZ7.5 (GLOVE) ×2 IMPLANT
GLOVE ECLIPSE 8.0 STRL XLNG CF (GLOVE) ×2 IMPLANT
GOWN STRL REUS W/TWL XL LVL3 (GOWN DISPOSABLE) ×2 IMPLANT
IMPL SPACEOAR SYSTEM 10ML (Spacer) ×1 IMPLANT
IMPLANT SPACEOAR SYSTEM 10ML (Spacer) ×2 IMPLANT
KIT TURNOVER CYSTO (KITS) ×2 IMPLANT
MARKER GOLD PRELOAD 1.2X3 (Urological Implant) ×3 IMPLANT
PACK CYSTO (CUSTOM PROCEDURE TRAY) ×2 IMPLANT
SEED GOLD PRELOAD 1.2X3 (Urological Implant) ×6 IMPLANT
SURGILUBE 2OZ TUBE FLIPTOP (MISCELLANEOUS) ×2 IMPLANT
UNDERPAD 30X30 (UNDERPADS AND DIAPERS) ×4 IMPLANT

## 2019-05-20 NOTE — Anesthesia Procedure Notes (Signed)
Procedure Name: LMA Insertion Date/Time: 05/20/2019 11:21 AM Performed by: Lynda Rainwater, MD Pre-anesthesia Checklist: Patient identified, Emergency Drugs available, Suction available and Patient being monitored Patient Re-evaluated:Patient Re-evaluated prior to induction Oxygen Delivery Method: Circle System Utilized Preoxygenation: Pre-oxygenation with 100% oxygen Induction Type: IV induction Ventilation: Mask ventilation without difficulty LMA: LMA with gastric port inserted LMA Size: 4.0 Number of attempts: 1 Placement Confirmation: positive ETCO2 Tube secured with: Tape Dental Injury: Teeth and Oropharynx as per pre-operative assessment

## 2019-05-20 NOTE — Discharge Instructions (Signed)
Hydrogel Spacer Implantation for Prostate Cancer, Care After This sheet gives you information about how to care for yourself after your procedure. Your health care provider may also give you more specific instructions. If you have problems or questions, contact your health care provider. What can I expect after the procedure? After the procedure, it is common to have:  A feeling of fullness in your rectum for a few days.  Pink-colored urine for a short time. This should clear as you increase your fluid intake. Follow these instructions at home: Medicines  Take over-the-counter and prescription medicines only as told by your health care provider.  If you were prescribed an antibiotic medicine, take it as told by your health care provider. Do not stop taking the antibiotic even if you start to feel better. Activity   Do not drive for 24 hours if you were given a sedative during your procedure.  Return to your normal activities as told by your health care provider. Ask your health care provider what activities are safe for you.  Do not lift anything that is heavier than 10 lb (4.5 kg), or the limit that you are told, until your health care provider says that it is safe. Eating and drinking   Drink enough fluid to keep your urine pale yellow.  Eat foods that are high in fiber, such as beans, whole grains, and fresh fruits and vegetables.  Limit foods that are high in fat and processed sugars, such as fried or sweet foods. General instructions  Do not place anything into your rectum for 3 months.  Keep all follow-up visits as told by your health care provider. This is important. ? You will need to return for an MRI test before starting your radiation treatment. Contact a health care provider if you have:  A fever.  Chills.  Rectal pain.  Pain when you urinate.  Trouble passing urine.  Blood in your urine or stool. Get help right away if you have:  Shortness of breath or  problems breathing. Summary  After the procedure, it is normal to have a feeling of fullness in your rectum for a few days.  Take over-the-counter and prescription medicines only as told by your health care provider.  Return to your normal activities as told by your health care provider.  Contact your health care provider if you have chills, a fever, pain, or blood in your urine or stool. This information is not intended to replace advice given to you by your health care provider. Make sure you discuss any questions you have with your health care provider. Document Released: 04/24/2018 Document Revised: 04/24/2018 Document Reviewed: 04/24/2018 Elsevier Patient Education  [place] Instructions  Activity: Get plenty of rest for the remainder of the day. A responsible individual must stay with you for 24 hours following the procedure.  For the next 24 hours, DO NOT: -Drive a car -Paediatric nurse -Drink alcoholic beverages -Take any medication unless instructed by your physician -Make any legal decisions or sign important papers.  Meals: Start with liquid foods such as gelatin or soup. Progress to regular foods as tolerated. Avoid greasy, spicy, heavy foods. If nausea and/or vomiting occur, drink only clear liquids until the nausea and/or vomiting subsides. Call your physician if vomiting continues.  Special Instructions/Symptoms: Your throat may feel dry or sore from the anesthesia or the breathing tube placed in your throat during surgery. If this causes discomfort, gargle with warm salt water.  The discomfort should disappear within 24 hours.  If you had a scopolamine patch placed behind your ear for the management of post- operative nausea and/or vomiting:  1. The medication in the patch is effective for 72 hours, after which it should be removed.  Wrap patch in a tissue and discard in the trash. Wash hands thoroughly with soap and water. 2.  You may remove the patch earlier than 72 hours if you experience unpleasant side effects which may include dry mouth, dizziness or visual disturbances. 3. Avoid touching the patch. Wash your hands with soap and water after contact with the patch.

## 2019-05-20 NOTE — Transfer of Care (Signed)
   Last Vitals:  Vitals Value Taken Time  BP 112/58 05/20/19 1151  Temp    Pulse 80 05/20/19 1154  Resp 13 05/20/19 1154  SpO2 97 % 05/20/19 1154  Vitals shown include unvalidated device data.  Last Pain:  Vitals:   05/20/19 1033  TempSrc:   PainSc: 0-No pain      Patients Stated Pain Goal: 5 (05/20/19 1033) Immediate Anesthesia Transfer of Care Note  Patient: [name]  Procedure(s) Performed: Procedure(s) (LRB): GOLD SEED IMPLANT (N/A) SPACE OAR INSTILLATION (N/A)  Patient Location: PACU  Anesthesia Type: General  Level of Consciousness: awake, alert  and oriented  Airway & Oxygen Therapy: Patient Spontanous Breathing and Patient connected to nasal cannula oxygen  Post-op Assessment: Report given to PACU RN and Post -op Vital signs reviewed and stable  Post vital signs: Reviewed and stable  Complications: No apparent anesthesia complications

## 2019-05-20 NOTE — Anesthesia Preprocedure Evaluation (Addendum)
Anesthesia Evaluation  Patient identified by MRN, date of birth, ID band Patient awake    Reviewed: Allergy & Precautions, NPO status , Patient's Chart, lab work & pertinent test results  Airway Mallampati: II  TM Distance: >3 FB Neck ROM: Full    Dental no notable dental hx.    Pulmonary neg pulmonary ROS, former smoker,    Pulmonary exam normal breath sounds clear to auscultation       Cardiovascular hypertension, Pt. on medications Normal cardiovascular exam+ dysrhythmias Atrial Fibrillation  Rhythm:Regular Rate:Normal     Neuro/Psych negative neurological ROS  negative psych ROS   GI/Hepatic negative GI ROS, Neg liver ROS,   Endo/Other  negative endocrine ROSdiabetes, Type 2  Renal/GU Renal InsufficiencyRenal disease  negative genitourinary   Musculoskeletal negative musculoskeletal ROS (+)   Abdominal   Peds negative pediatric ROS (+)  Hematology negative hematology ROS (+)   Anesthesia Other Findings   Reproductive/Obstetrics negative OB ROS                            Anesthesia Physical Anesthesia Plan  ASA: III  Anesthesia Plan: General   Post-op Pain Management:    Induction: Intravenous  PONV Risk Score and Plan: 2 and Ondansetron, Midazolam and Treatment may vary due to age or medical condition  Airway Management Planned: LMA  Additional Equipment:   Intra-op Plan:   Post-operative Plan: Extubation in OR  Informed Consent: I have reviewed the patients History and Physical, chart, labs and discussed the procedure including the risks, benefits and alternatives for the proposed anesthesia with the patient or authorized representative who has indicated his/her understanding and acceptance.     Dental advisory given  Plan Discussed with: CRNA  Anesthesia Plan Comments:         Anesthesia Quick Evaluation

## 2019-05-20 NOTE — Op Note (Signed)
.  Operative Note   Preoperative diagnosis:  1.  Gleason 3+4 prostate cancer   Postoperative diagnosis: 1.  Same    Procedure(s): 1.  Transrectal ultrasound guided prostatic gold seed fiducial marker and Space OAR placement   Surgeon: Ellison Hughs, MD   Assistants:  None    Anesthesia:  MAC   Complications:  None   EBL:  <5 mL   Specimens: 1. None   Drains/Catheters: 1.  None   Intraoperative findings:   1. Fiducial markers were placed at the prostatic base, mid-gland and apex.  SpaceOAR was placed with good separation of the prostate and rectum.    Indication: Mr. Noone is a 78 y.o. male with  a history of T1c Gleason 3+4 prostate cancer.  He has elected to undergo external beam radiation for primary treatment of his prostate cancer.  The risk, benefits and alternatives of the above procedures was discussed in detail.  He voices understanding and wishes to proceed.   Description of procedure:   After informed consent the patient was brought to the major OR, placed on the table and administered general anesthesia. He was then moved to the modified lithotomy position with his perineum perpendicular to the floor. His perineum and genitalia were then sterilely prepped. An official timeout was then performed.    Real time transrectal ultrasonography was used visualize the prostate.  Gold seed fiducial markers were then placed transperineally at the prostatic base, mid gland and apex.   I then proceeded with placement of SpaceOAR by introducing a needle with the bevel angled inferiorly approximately 2 cm superior to the anus. This was angled downward and under direct ultrasound was placed within the space between the prostatic capsule and rectum. This was confirmed with a small amount of sterile saline injected and this was performed under direct ultrasound. I then attached the SpaceOAR to the needle and injected this in the space between the prostate and rectum with good  placement noted.  The patient tolerated the procedure well and was transferred to the postanesthesia in stable condition.   Plan: Follow-up in 4 months with PSA

## 2019-05-20 NOTE — H&P (Addendum)
Urology Preoperative H&P   Chief Complaint: prostate cancer  History of Present Illness: [name] is a 78 y.o. male with a history of T1c Gleason 3+4 prostate cancer and BPH w/ LUTS (tx w/ tamsulosin once daily).   Last PSA-7.94 (01/2019), 5.56 (06/2018), down from 6.96 (01/2018). His PSA has steadily risen over the past 3 years. No personal/family history of prostate cancer.   The patient underwent a prostate biopsy on 02/26/19 and was found to have Gleason 3+4 cancer. He has met with Dr. Tammi Klippel and after careful consideration, he has elected to proceed with EBRT as primary treatment of his intermediate risk prostate cancer.   TNM stage: T1c  Gleason score: 3+4  Left: LB G6 (5%), LM G6 (5%), MA G3+4 (30%),  Right: LB G3+4 (30%), MB G3+4 (10%)  Prostate volume: 80 cm^3   He denies any new or worsening voiding sxs today. He continues to take tamsulosin once daily w/o any side effects.    Past Medical History:  Diagnosis Date  . CKD (chronic kidney disease), stage III ([place])   . Dyslipidemia   . ED (erectile dysfunction)   . History of Guillain-Barre syndrome    per pt 1990s fron Flu Vaccine,  stated resolved with no residual  . History of nuclear stress test    08-23-2004  by dr Ron Parker--- normal without ischemia,  ef 68%  . Hyperplasia of prostate with lower urinary tract symptoms (LUTS)   . Hypertension   . NASH (nonalcoholic steatohepatitis) 2010   followed by pcp  . Prostate cancer [facility] urologist-- dr winter;  oncologist-- dr Tammi Klippel   dx 02-26-2019,  Stage T1c,  Gleason 3+4, vol 80.1cc;  plan external radiation  . Rhinorrhea   . Type 2 diabetes mellitus ([place])     Past Surgical History:  Procedure Laterality Date  . CARPAL TUNNEL RELEASE Right 1990s  . HERNIA REPAIR  infant  . prostate biopsy  02-26-2019  dr winter office  . TONSILLECTOMY  child    Allergies:  Allergies  Allergen Reactions  . Lovastatin Other (See Comments)    REACTION: Foot pain  . Simvastatin  Other (See Comments)    REACTION: Myalgias    Family History  Problem Relation Age of Onset  . Congestive Heart Failure Mother   . Cirrhosis Father   . Cancer Neg Hx     Social History:  reports that he quit smoking about 37 years ago. His smoking use included cigarettes. He has a 15.00 pack-year smoking history. He has never used smokeless tobacco. He reports that he does not drink alcohol or use drugs.  ROS: A complete review of systems was performed.  All systems are negative except for pertinent findings as noted.  Physical Exam:  Vital signs in last 24 hours: Weight:  [86.2 kg] 86.2 kg (07/14 1633) Constitutional:  Alert and oriented, No acute distress Cardiovascular: Regular rate and rhythm, No JVD Respiratory: Normal respiratory effort, Lungs clear bilaterally GI: Abdomen is soft, nontender, nondistended, no abdominal masses GU: No CVA tenderness Lymphatic: No lymphadenopathy Neurologic: Grossly intact, no focal deficits Psychiatric: Normal mood and affect  Laboratory Data:  No results for input(s): WBC, HGB, HCT, PLT in the last 72 hours.  No results for input(s): NA, K, CL, GLUCOSE, BUN, CALCIUM, CREATININE in the last 72 hours.  Invalid input(s): CO3   No results found for this or any previous visit (from the past 24 hour(s)). Recent Results (from the past 240 hour(s))  SARS Coronavirus 2 (Performed  in [place] lab)     Status: None   Collection Time: 05/16/19 12:50 PM   Specimen: Nasal Swab  Result Value Ref Range Status   SARS Coronavirus 2 NEGATIVE NEGATIVE Final    Comment: (NOTE) SARS-CoV-2 target nucleic acids are NOT DETECTED. The SARS-CoV-2 RNA is generally detectable in upper and lower respiratory specimens during the acute phase of infection. Negative results do not preclude SARS-CoV-2 infection, do not rule out co-infections with other pathogens, and should not be used as the sole basis for treatment or other patient management  decisions. Negative results must be combined with clinical observations, patient history, and epidemiological information. The expected result is Negative. Fact Sheet for Patients: SugarRoll.be Fact Sheet for Healthcare Providers: https://www.woods-mathews.com/ This test is not yet approved or cleared by the [place] FDA and  has been authorized for detection and/or diagnosis of SARS-CoV-2 by FDA under an Emergency Use Authorization (EUA). This EUA will remain  in effect (meaning this test can be used) for the duration of the COVID-19 declaration under Section 56 4(b)(1) of the Act, 21 U.S.C. section 360bbb-3(b)(1), unless the authorization is terminated or revoked sooner. Performed at [facility], [place] [address] [place], [place] [address]     Renal Function: No results for input(s): CREATININE in the last 168 hours. CrCl cannot be calculated (Patient's most recent lab result is older than the maximum 21 days allowed.).  Radiologic Imaging: No results found.  I independently reviewed the above imaging studies.  Assessment and Plan [name] is a 78 y.o. male with prostate cancer  -He has elected to proceed with EBRT as primary treatment of his prostate cancer.  -The risks, benefits and alternatives of gold seed fiducial marker and SpaceOAR placement was discussed with the patient. He voices understanding and wishes to proceed. He would like to have the procedure done under anesthesia.    Ellison Hughs, MD 05/20/2019, 7:06 AM  [facility] Pager: [phone]

## 2019-05-20 NOTE — Anesthesia Postprocedure Evaluation (Signed)
Anesthesia Post Note  Patient: [name]  Procedure(s) Performed: GOLD SEED IMPLANT (N/A Prostate) SPACE OAR INSTILLATION (N/A Prostate)     Patient location during evaluation: PACU Anesthesia Type: General Level of consciousness: awake and alert Pain management: pain level controlled Vital Signs Assessment: post-procedure vital signs reviewed and stable Respiratory status: spontaneous breathing, nonlabored ventilation and respiratory function stable Cardiovascular status: blood pressure returned to baseline and stable Postop Assessment: no apparent nausea or vomiting Anesthetic complications: no    Last Vitals:  Vitals:   05/20/19 1245 05/20/19 1319  BP: 96/61 101/63  Pulse: 67 72  Resp: 13 14  Temp:  (!) 36.3 C  SpO2: 97% 100%    Last Pain:  Vitals:   05/20/19 1315  TempSrc:   PainSc: 0-No pain                 Lynda Rainwater

## 2019-05-21 ENCOUNTER — Telehealth: Payer: Self-pay | Admitting: *Deleted

## 2019-05-21 NOTE — Telephone Encounter (Signed)
CALLED PATIENT TO REMIND OF SIM APPT. FOR 05-22-19 - ARRIVAL TIME- 9:45 AM @ [facility] AND HIS MRI ON 05-22-19- ARRIVALTIME -12:30 PM @ [facility], NO RESTRICTIONS TO TEST, SPOKE WITH PATIENT AND HE IS AWARE OF THESE APPTS.

## 2019-05-22 ENCOUNTER — Other Ambulatory Visit: Payer: Self-pay

## 2019-05-22 ENCOUNTER — Ambulatory Visit
Admission: RE | Admit: 2019-05-22 | Discharge: 2019-05-22 | Disposition: A | Discharge status: Home / Self Care | Payer: Medicare Other | Source: Ambulatory Visit | Attending: Radiation Oncology | Admitting: Radiation Oncology

## 2019-05-22 ENCOUNTER — Ambulatory Visit (HOSPITAL_COMMUNITY)
Admission: RE | Admit: 2019-05-22 | Discharge: 2019-05-22 | Disposition: A | Discharge status: Home / Self Care | Payer: Medicare Other | Source: Ambulatory Visit | Attending: Urology | Admitting: Urology

## 2019-05-22 ENCOUNTER — Encounter (HOSPITAL_BASED_OUTPATIENT_CLINIC_OR_DEPARTMENT_OTHER): Payer: Self-pay | Admitting: Urology

## 2019-05-22 DIAGNOSIS — Z510 Encounter for antineoplastic radiation therapy: Secondary | ICD-10-CM | POA: Insufficient documentation

## 2019-05-22 DIAGNOSIS — C61 Malignant neoplasm of prostate: Secondary | ICD-10-CM | POA: Diagnosis present

## 2019-05-24 NOTE — Progress Notes (Signed)
  Radiation Oncology         [phone] ________________________________  Name: [name] MRN: [id number]  Date: 05/22/2019  DOB: 10/26/1941  SIMULATION AND TREATMENT PLANNING NOTE    ICD-10-CM   1. Malignant neoplasm of prostate ([place])  C61     DIAGNOSIS:  78 y.o. gentleman with Stage T1c adenocarcinoma of the prostate with Gleason score of 3+4 and PSA of 7.94   NARRATIVE:  The patient was brought to the [facility].  Identity was confirmed.  All relevant records and images related to the planned course of therapy were reviewed.  The patient freely provided informed written consent to proceed with treatment after reviewing the details related to the planned course of therapy. The consent form was witnessed and verified by the simulation staff.  Then, the patient was set-up in a stable reproducible supine position for radiation therapy.  A vacuum lock pillow device was custom fabricated to position his legs in a reproducible immobilized position.  Then, I performed a urethrogram under sterile conditions to identify the prostatic apex.  CT images were obtained.  Surface markings were placed.  The CT images were loaded into the planning software.  Then the prostate target and avoidance structures including the rectum, bladder, bowel and hips were contoured.  Treatment planning then occurred.  The radiation prescription was entered and confirmed.  A total of one complex treatment devices was fabricated. I have requested : Intensity Modulated Radiotherapy (IMRT) is medically necessary for this case for the following reason:  Rectal sparing.Marland Kitchen  PLAN:  The patient will receive 70 Gy in 28 fractions.  ________________________________  Sheral Apley Tammi Klippel, M.D.

## 2019-05-26 DIAGNOSIS — C61 Malignant neoplasm of prostate: Secondary | ICD-10-CM | POA: Diagnosis not present

## 2019-06-02 ENCOUNTER — Ambulatory Visit: Payer: Medicare Other

## 2019-06-03 ENCOUNTER — Ambulatory Visit
Admission: RE | Admit: 2019-06-03 | Discharge: 2019-06-03 | Disposition: A | Discharge status: Home / Self Care | Payer: Medicare Other | Source: Ambulatory Visit | Attending: Radiation Oncology | Admitting: Radiation Oncology

## 2019-06-03 ENCOUNTER — Other Ambulatory Visit: Payer: Self-pay

## 2019-06-03 DIAGNOSIS — C61 Malignant neoplasm of prostate: Secondary | ICD-10-CM | POA: Diagnosis not present

## 2019-06-04 ENCOUNTER — Other Ambulatory Visit: Payer: Self-pay

## 2019-06-04 ENCOUNTER — Ambulatory Visit
Admission: RE | Admit: 2019-06-04 | Discharge: 2019-06-04 | Disposition: A | Discharge status: Home / Self Care | Payer: Medicare Other | Source: Ambulatory Visit | Attending: Radiation Oncology | Admitting: Radiation Oncology

## 2019-06-04 DIAGNOSIS — C61 Malignant neoplasm of prostate: Secondary | ICD-10-CM | POA: Diagnosis not present

## 2019-06-05 ENCOUNTER — Ambulatory Visit
Admission: RE | Admit: 2019-06-05 | Discharge: 2019-06-05 | Disposition: A | Discharge status: Home / Self Care | Payer: Medicare Other | Source: Ambulatory Visit | Attending: Radiation Oncology | Admitting: Radiation Oncology

## 2019-06-05 ENCOUNTER — Other Ambulatory Visit: Payer: Self-pay

## 2019-06-05 DIAGNOSIS — C61 Malignant neoplasm of prostate: Secondary | ICD-10-CM | POA: Diagnosis not present

## 2019-06-08 ENCOUNTER — Ambulatory Visit (INDEPENDENT_AMBULATORY_CARE_PROVIDER_SITE_OTHER): Payer: Medicare Other | Admitting: Family Medicine

## 2019-06-08 ENCOUNTER — Ambulatory Visit
Admission: RE | Admit: 2019-06-08 | Discharge: 2019-06-08 | Disposition: A | Discharge status: Home / Self Care | Payer: Medicare Other | Source: Ambulatory Visit | Attending: Radiation Oncology | Admitting: Radiation Oncology

## 2019-06-08 ENCOUNTER — Encounter: Payer: Self-pay | Admitting: Family Medicine

## 2019-06-08 ENCOUNTER — Other Ambulatory Visit: Payer: Self-pay

## 2019-06-08 VITALS — BP 126/62 | HR 61 | Temp 97.7°F | Ht 68.0 in | Wt 183.4 lb

## 2019-06-08 DIAGNOSIS — G610 Guillain-Barre syndrome: Secondary | ICD-10-CM

## 2019-06-08 DIAGNOSIS — E1169 Type 2 diabetes mellitus with other specified complication: Secondary | ICD-10-CM

## 2019-06-08 DIAGNOSIS — Z510 Encounter for antineoplastic radiation therapy: Secondary | ICD-10-CM | POA: Diagnosis not present

## 2019-06-08 DIAGNOSIS — E785 Hyperlipidemia, unspecified: Secondary | ICD-10-CM

## 2019-06-08 DIAGNOSIS — I152 Hypertension secondary to endocrine disorders: Secondary | ICD-10-CM

## 2019-06-08 DIAGNOSIS — C61 Malignant neoplasm of prostate: Secondary | ICD-10-CM | POA: Insufficient documentation

## 2019-06-08 DIAGNOSIS — E1159 Type 2 diabetes mellitus with other circulatory complications: Secondary | ICD-10-CM

## 2019-06-08 DIAGNOSIS — E663 Overweight: Secondary | ICD-10-CM

## 2019-06-08 DIAGNOSIS — Z6827 Body mass index (BMI) 27.0-27.9, adult: Secondary | ICD-10-CM

## 2019-06-08 DIAGNOSIS — E119 Type 2 diabetes mellitus without complications: Secondary | ICD-10-CM

## 2019-06-08 DIAGNOSIS — I10 Essential (primary) hypertension: Secondary | ICD-10-CM

## 2019-06-08 LAB — POCT GLYCOSYLATED HEMOGLOBIN (HGB A1C): Hemoglobin A1C: 5.9 % — AB (ref 4.0–5.6)

## 2019-06-08 NOTE — Progress Notes (Signed)
   Chief Complaint:  [name] is a 78 y.o. male who presents today with a chief complaint of T2DM follow up.   Assessment/Plan:  Hypertension associated with diabetes ([place]) At goal. Continue losartan-HCTZ 100-50m once daily.   Dyslipidemia associated with type 2 diabetes mellitus ([facility]) Stable.  Continue Lipitor 80 mg daily.  Check lipid panel with next blood draw.  Diabetes ([place] A1c improved to 5.9.  Stop Januvia.  Continue metformin 500 mg twice daily.  Follow-up in 6 months.  Guillain-Barre syndrome Stable. No signs of recurrence.   Malignant neoplasm of prostate ([facility] Continue management per urology and oncology.  Body mass index is 27.88 kg/m. / Overweight BMI Metric Follow Up - 06/08/19 0932      BMI Metric Follow Up-Please document annually   BMI Metric Follow Up  Education provided         Subjective:  HPI:  His stable, chronic medical conditions are outlined below:  # T2DM - On Januvia 100 mg daily and metformin 500 mg twice daily.  - Tolerating both these well without side effects.  # Essential Hypertension - On losartan-HCTZ 100-25 mg tablet once daily and tolerating well.  - ROS: No reported chest pain or shortness of breath.  # Dyslipidemia  - On Lipitor 80 mg daily and tolerating well - ROS: No reported myalgias.  % Prostate Cancer - Follows with urology and oncology for radiation  BMI 27 Working on diet and exercise.   ROS: Per HPI  PMH: He reports that he quit smoking about 37 years ago. His smoking use included cigarettes. He has a 15.00 pack-year smoking history. He has never used smokeless tobacco. He reports that he does not drink alcohol or use drugs.      Objective:  Physical Exam: BP 126/62 (BP Location: Left Arm, Patient Position: Sitting, Cuff Size: Normal)   Pulse 61   Temp 97.7 F (36.5 C) (Oral)   Ht 5' 8"  (1.727 m)   Wt 183 lb 6.1 oz (83.2 kg)   SpO2 99%   BMI 27.88 kg/m   Wt Readings from Last 3 Encounters:   06/08/19 183 lb 6.1 oz (83.2 kg)  05/20/19 183 lb 11.2 oz (83.3 kg)  03/24/19 200 lb (90.7 kg)   Gen: NAD, resting comfortably CV: Regular rate and rhythm with no murmurs appreciated Pulm: Normal work of breathing, clear to auscultation bilaterally with no crackles, wheezes, or rhonchi Neuro: Grossly normal, moves all extremities Psych: Normal affect and thought content       M. PJerline Pain MD 06/08/2019 9:41 AM

## 2019-06-08 NOTE — Assessment & Plan Note (Signed)
A1c improved to 5.9.  Stop Januvia.  Continue metformin 500 mg twice daily.  Follow-up in 6 months.

## 2019-06-08 NOTE — Assessment & Plan Note (Signed)
At goal. Continue losartan-HCTZ 100-73m once daily.

## 2019-06-08 NOTE — Assessment & Plan Note (Signed)
Stable. No signs of recurrence.

## 2019-06-08 NOTE — Patient Instructions (Signed)
It was very nice to see you today!  Your A1c looks great.  Please stop the Januvia.  Come back in 6 months to have your blood work rechecked.  Come back sooner if needed.  Take care, Dr [name]  Please try these tips to maintain a healthy lifestyle:   Eat at least 3 REAL meals and 1-2 snacks per day.  Aim for no more than 5 hours between eating.  If you eat breakfast, please do so within one hour of getting up.    Obtain twice as many fruits/vegetables as protein or carbohydrate foods for both lunch and dinner. (Half of each meal should be fruits/vegetables, one quarter protein, and one quarter starchy carbs)   Cut down on sweet beverages. This includes juice, soda, and sweet tea.    Exercise at least 150 minutes every week.

## 2019-06-08 NOTE — Assessment & Plan Note (Signed)
Continue management per urology and oncology.

## 2019-06-08 NOTE — Assessment & Plan Note (Signed)
Stable.  Continue Lipitor 80 mg daily.  Check lipid panel with next blood draw.

## 2019-06-09 ENCOUNTER — Ambulatory Visit
Admission: RE | Admit: 2019-06-09 | Discharge: 2019-06-09 | Disposition: A | Discharge status: Home / Self Care | Payer: Medicare Other | Source: Ambulatory Visit | Attending: Radiation Oncology | Admitting: Radiation Oncology

## 2019-06-09 ENCOUNTER — Other Ambulatory Visit: Payer: Self-pay

## 2019-06-09 DIAGNOSIS — C61 Malignant neoplasm of prostate: Secondary | ICD-10-CM | POA: Diagnosis not present

## 2019-06-10 ENCOUNTER — Ambulatory Visit
Admission: RE | Admit: 2019-06-10 | Discharge: 2019-06-10 | Disposition: A | Discharge status: Home / Self Care | Payer: Medicare Other | Source: Ambulatory Visit | Attending: Radiation Oncology | Admitting: Radiation Oncology

## 2019-06-10 ENCOUNTER — Other Ambulatory Visit: Payer: Self-pay

## 2019-06-10 DIAGNOSIS — C61 Malignant neoplasm of prostate: Secondary | ICD-10-CM | POA: Diagnosis not present

## 2019-06-11 ENCOUNTER — Other Ambulatory Visit: Payer: Self-pay

## 2019-06-11 ENCOUNTER — Ambulatory Visit
Admission: RE | Admit: 2019-06-11 | Discharge: 2019-06-11 | Disposition: A | Discharge status: Home / Self Care | Payer: Medicare Other | Source: Ambulatory Visit | Attending: Radiation Oncology | Admitting: Radiation Oncology

## 2019-06-11 DIAGNOSIS — C61 Malignant neoplasm of prostate: Secondary | ICD-10-CM | POA: Diagnosis not present

## 2019-06-12 ENCOUNTER — Ambulatory Visit
Admission: RE | Admit: 2019-06-12 | Discharge: 2019-06-12 | Disposition: A | Discharge status: Home / Self Care | Payer: Medicare Other | Source: Ambulatory Visit | Attending: Radiation Oncology | Admitting: Radiation Oncology

## 2019-06-12 ENCOUNTER — Other Ambulatory Visit: Payer: Self-pay

## 2019-06-12 DIAGNOSIS — C61 Malignant neoplasm of prostate: Secondary | ICD-10-CM | POA: Diagnosis not present

## 2019-06-15 ENCOUNTER — Ambulatory Visit
Admission: RE | Admit: 2019-06-15 | Discharge: 2019-06-15 | Disposition: A | Discharge status: Home / Self Care | Payer: Medicare Other | Source: Ambulatory Visit | Attending: Radiation Oncology | Admitting: Radiation Oncology

## 2019-06-15 ENCOUNTER — Other Ambulatory Visit: Payer: Self-pay

## 2019-06-15 DIAGNOSIS — C61 Malignant neoplasm of prostate: Secondary | ICD-10-CM | POA: Diagnosis not present

## 2019-06-16 ENCOUNTER — Ambulatory Visit
Admission: RE | Admit: 2019-06-16 | Discharge: 2019-06-16 | Disposition: A | Discharge status: Home / Self Care | Payer: Medicare Other | Source: Ambulatory Visit | Attending: Radiation Oncology | Admitting: Radiation Oncology

## 2019-06-16 ENCOUNTER — Other Ambulatory Visit: Payer: Self-pay

## 2019-06-16 DIAGNOSIS — C61 Malignant neoplasm of prostate: Secondary | ICD-10-CM | POA: Diagnosis not present

## 2019-06-17 ENCOUNTER — Other Ambulatory Visit: Payer: Self-pay

## 2019-06-17 ENCOUNTER — Ambulatory Visit
Admission: RE | Admit: 2019-06-17 | Discharge: 2019-06-17 | Disposition: A | Discharge status: Home / Self Care | Payer: Medicare Other | Source: Ambulatory Visit | Attending: Radiation Oncology | Admitting: Radiation Oncology

## 2019-06-17 DIAGNOSIS — C61 Malignant neoplasm of prostate: Secondary | ICD-10-CM | POA: Diagnosis not present

## 2019-06-18 ENCOUNTER — Ambulatory Visit
Admission: RE | Admit: 2019-06-18 | Discharge: 2019-06-18 | Disposition: A | Discharge status: Home / Self Care | Payer: Medicare Other | Source: Ambulatory Visit | Attending: Radiation Oncology | Admitting: Radiation Oncology

## 2019-06-18 DIAGNOSIS — C61 Malignant neoplasm of prostate: Secondary | ICD-10-CM | POA: Diagnosis not present

## 2019-06-19 ENCOUNTER — Other Ambulatory Visit: Payer: Self-pay

## 2019-06-19 ENCOUNTER — Ambulatory Visit
Admission: RE | Admit: 2019-06-19 | Discharge: 2019-06-19 | Disposition: A | Discharge status: Home / Self Care | Payer: Medicare Other | Source: Ambulatory Visit | Attending: Radiation Oncology | Admitting: Radiation Oncology

## 2019-06-19 ENCOUNTER — Other Ambulatory Visit: Payer: Self-pay | Admitting: Radiation Oncology

## 2019-06-19 DIAGNOSIS — C61 Malignant neoplasm of prostate: Secondary | ICD-10-CM | POA: Diagnosis not present

## 2019-06-22 ENCOUNTER — Other Ambulatory Visit: Payer: Self-pay

## 2019-06-22 ENCOUNTER — Ambulatory Visit
Admission: RE | Admit: 2019-06-22 | Discharge: 2019-06-22 | Disposition: A | Discharge status: Home / Self Care | Payer: Medicare Other | Source: Ambulatory Visit | Attending: Radiation Oncology | Admitting: Radiation Oncology

## 2019-06-22 DIAGNOSIS — C61 Malignant neoplasm of prostate: Secondary | ICD-10-CM | POA: Diagnosis not present

## 2019-06-23 ENCOUNTER — Ambulatory Visit: Payer: Medicare Other

## 2019-06-24 ENCOUNTER — Ambulatory Visit
Admission: RE | Admit: 2019-06-24 | Discharge: 2019-06-24 | Disposition: A | Discharge status: Home / Self Care | Payer: Medicare Other | Source: Ambulatory Visit | Attending: Radiation Oncology | Admitting: Radiation Oncology

## 2019-06-24 ENCOUNTER — Other Ambulatory Visit: Payer: Self-pay

## 2019-06-24 DIAGNOSIS — C61 Malignant neoplasm of prostate: Secondary | ICD-10-CM | POA: Diagnosis not present

## 2019-06-25 ENCOUNTER — Other Ambulatory Visit: Payer: Self-pay

## 2019-06-25 ENCOUNTER — Ambulatory Visit
Admission: RE | Admit: 2019-06-25 | Discharge: 2019-06-25 | Disposition: A | Discharge status: Home / Self Care | Payer: Medicare Other | Source: Ambulatory Visit | Attending: Radiation Oncology | Admitting: Radiation Oncology

## 2019-06-25 DIAGNOSIS — C61 Malignant neoplasm of prostate: Secondary | ICD-10-CM | POA: Diagnosis not present

## 2019-06-26 ENCOUNTER — Other Ambulatory Visit: Payer: Self-pay

## 2019-06-26 ENCOUNTER — Ambulatory Visit
Admission: RE | Admit: 2019-06-26 | Discharge: 2019-06-26 | Disposition: A | Discharge status: Home / Self Care | Payer: Medicare Other | Source: Ambulatory Visit | Attending: Radiation Oncology | Admitting: Radiation Oncology

## 2019-06-26 DIAGNOSIS — C61 Malignant neoplasm of prostate: Secondary | ICD-10-CM | POA: Diagnosis not present

## 2019-06-29 ENCOUNTER — Ambulatory Visit: Payer: Medicare Other

## 2019-06-29 ENCOUNTER — Ambulatory Visit
Admission: RE | Admit: 2019-06-29 | Discharge: 2019-06-29 | Disposition: A | Discharge status: Home / Self Care | Payer: Medicare Other | Source: Ambulatory Visit | Attending: Radiation Oncology | Admitting: Radiation Oncology

## 2019-06-29 DIAGNOSIS — C61 Malignant neoplasm of prostate: Secondary | ICD-10-CM | POA: Diagnosis not present

## 2019-06-30 ENCOUNTER — Other Ambulatory Visit: Payer: Self-pay

## 2019-06-30 ENCOUNTER — Other Ambulatory Visit: Payer: Self-pay | Admitting: Family Medicine

## 2019-06-30 ENCOUNTER — Ambulatory Visit
Admission: RE | Admit: 2019-06-30 | Discharge: 2019-06-30 | Disposition: A | Discharge status: Home / Self Care | Payer: Medicare Other | Source: Ambulatory Visit | Attending: Radiation Oncology | Admitting: Radiation Oncology

## 2019-06-30 DIAGNOSIS — C61 Malignant neoplasm of prostate: Secondary | ICD-10-CM | POA: Diagnosis not present

## 2019-07-01 ENCOUNTER — Ambulatory Visit
Admission: RE | Admit: 2019-07-01 | Discharge: 2019-07-01 | Disposition: A | Discharge status: Home / Self Care | Payer: Medicare Other | Source: Ambulatory Visit | Attending: Radiation Oncology | Admitting: Radiation Oncology

## 2019-07-01 ENCOUNTER — Other Ambulatory Visit: Payer: Self-pay

## 2019-07-01 DIAGNOSIS — C61 Malignant neoplasm of prostate: Secondary | ICD-10-CM | POA: Diagnosis not present

## 2019-07-02 ENCOUNTER — Other Ambulatory Visit: Payer: Self-pay

## 2019-07-02 ENCOUNTER — Ambulatory Visit
Admission: RE | Admit: 2019-07-02 | Discharge: 2019-07-02 | Disposition: A | Discharge status: Home / Self Care | Payer: Medicare Other | Source: Ambulatory Visit | Attending: Radiation Oncology | Admitting: Radiation Oncology

## 2019-07-02 DIAGNOSIS — C61 Malignant neoplasm of prostate: Secondary | ICD-10-CM | POA: Diagnosis not present

## 2019-07-03 ENCOUNTER — Other Ambulatory Visit: Payer: Self-pay

## 2019-07-03 ENCOUNTER — Ambulatory Visit
Admission: RE | Admit: 2019-07-03 | Discharge: 2019-07-03 | Disposition: A | Discharge status: Home / Self Care | Payer: Medicare Other | Source: Ambulatory Visit | Attending: Radiation Oncology | Admitting: Radiation Oncology

## 2019-07-03 DIAGNOSIS — C61 Malignant neoplasm of prostate: Secondary | ICD-10-CM | POA: Diagnosis not present

## 2019-07-06 ENCOUNTER — Ambulatory Visit
Admission: RE | Admit: 2019-07-06 | Discharge: 2019-07-06 | Disposition: A | Discharge status: Home / Self Care | Payer: Medicare Other | Source: Ambulatory Visit | Attending: Radiation Oncology | Admitting: Radiation Oncology

## 2019-07-06 ENCOUNTER — Other Ambulatory Visit: Payer: Self-pay

## 2019-07-06 DIAGNOSIS — C61 Malignant neoplasm of prostate: Secondary | ICD-10-CM | POA: Diagnosis not present

## 2019-07-07 ENCOUNTER — Ambulatory Visit
Admission: RE | Admit: 2019-07-07 | Discharge: 2019-07-07 | Disposition: A | Discharge status: Home / Self Care | Payer: Medicare Other | Source: Ambulatory Visit | Attending: Radiation Oncology | Admitting: Radiation Oncology

## 2019-07-07 ENCOUNTER — Other Ambulatory Visit: Payer: Self-pay

## 2019-07-07 DIAGNOSIS — C61 Malignant neoplasm of prostate: Secondary | ICD-10-CM | POA: Diagnosis not present

## 2019-07-07 DIAGNOSIS — Z510 Encounter for antineoplastic radiation therapy: Secondary | ICD-10-CM | POA: Insufficient documentation

## 2019-07-08 ENCOUNTER — Other Ambulatory Visit: Payer: Self-pay

## 2019-07-08 ENCOUNTER — Ambulatory Visit
Admission: RE | Admit: 2019-07-08 | Discharge: 2019-07-08 | Disposition: A | Discharge status: Home / Self Care | Payer: Medicare Other | Source: Ambulatory Visit | Attending: Radiation Oncology | Admitting: Radiation Oncology

## 2019-07-08 DIAGNOSIS — C61 Malignant neoplasm of prostate: Secondary | ICD-10-CM | POA: Diagnosis not present

## 2019-07-09 ENCOUNTER — Other Ambulatory Visit: Payer: Self-pay

## 2019-07-09 ENCOUNTER — Ambulatory Visit
Admission: RE | Admit: 2019-07-09 | Discharge: 2019-07-09 | Disposition: A | Discharge status: Home / Self Care | Payer: Medicare Other | Source: Ambulatory Visit | Attending: Radiation Oncology | Admitting: Radiation Oncology

## 2019-07-09 ENCOUNTER — Ambulatory Visit: Payer: Medicare Other

## 2019-07-09 DIAGNOSIS — C61 Malignant neoplasm of prostate: Secondary | ICD-10-CM | POA: Diagnosis not present

## 2019-07-10 ENCOUNTER — Other Ambulatory Visit: Payer: Self-pay

## 2019-07-10 ENCOUNTER — Ambulatory Visit: Payer: Medicare Other

## 2019-07-10 ENCOUNTER — Ambulatory Visit
Admission: RE | Admit: 2019-07-10 | Discharge: 2019-07-10 | Disposition: A | Discharge status: Home / Self Care | Payer: Medicare Other | Source: Ambulatory Visit | Attending: Radiation Oncology | Admitting: Radiation Oncology

## 2019-07-10 ENCOUNTER — Telehealth: Payer: Self-pay | Admitting: *Deleted

## 2019-07-10 DIAGNOSIS — C61 Malignant neoplasm of prostate: Secondary | ICD-10-CM | POA: Diagnosis not present

## 2019-07-10 NOTE — Telephone Encounter (Signed)
Called patient to reschedule fu on 08-20-19 to 08-27-19 @ 2 pm, patient agreed to new time and date

## 2019-07-13 ENCOUNTER — Other Ambulatory Visit: Payer: Self-pay | Admitting: Family Medicine

## 2019-07-14 ENCOUNTER — Ambulatory Visit: Payer: Medicare Other

## 2019-07-14 ENCOUNTER — Other Ambulatory Visit: Payer: Self-pay

## 2019-07-14 ENCOUNTER — Encounter: Payer: Self-pay | Admitting: Radiation Oncology

## 2019-07-14 DIAGNOSIS — C61 Malignant neoplasm of prostate: Secondary | ICD-10-CM | POA: Diagnosis not present

## 2019-07-14 NOTE — Progress Notes (Signed)
  Radiation Oncology         [phone] ________________________________  Name: [name] MRN: [id number]  Date: 07/14/2019  DOB: 1941-02-17  End of Treatment Note  Diagnosis:   78 y.o. gentleman with Stage T1c adenocarcinoma of the prostate with Gleason score of 3+4 and PSA of 7.94     Indication for treatment:  Curative, Definitive Radiotherapy       Radiation treatment dates:   06/03/19-07/14/19  Site/dose:   The prostate was treated to 70 Gy in 28 fractions of 2.5 Gy  Beams/energy:   The patient was treated with IMRT using volumetric arc therapy delivering 6 MV X-rays to clockwise and counterclockwise circumferential arcs with a 90 degree collimator offset to avoid dose scalloping.  Image guidance was performed with daily cone beam CT prior to each fraction to align to gold markers in the prostate and assure proper bladder and rectal fill volumes.  Immobilization was achieved with BodyFix custom mold.  Narrative: The patient tolerated radiation treatment relatively well.   The patient experienced some minor urinary irritation and modest fatigue.  Patient denied pain. Pt reported occasional mild burning during urination. Urine stream was normal. Nocturia x3-5. No further concerns  Plan: The patient has completed radiation treatment. He will return to radiation oncology clinic for routine followup in one month. I advised him to call or return sooner if he has any questions or concerns related to his recovery or treatment. ________________________________  Sheral Apley. Tammi Klippel, M.D.

## 2019-07-15 ENCOUNTER — Other Ambulatory Visit: Payer: Self-pay | Admitting: Family Medicine

## 2019-07-15 ENCOUNTER — Ambulatory Visit: Payer: Medicare Other

## 2019-07-29 ENCOUNTER — Other Ambulatory Visit: Payer: Self-pay | Admitting: Family Medicine

## 2019-08-20 ENCOUNTER — Ambulatory Visit: Payer: Self-pay | Admitting: Urology

## 2019-08-27 ENCOUNTER — Encounter: Payer: Self-pay | Admitting: Urology

## 2019-08-27 ENCOUNTER — Other Ambulatory Visit: Payer: Self-pay

## 2019-08-27 ENCOUNTER — Ambulatory Visit
Admission: RE | Admit: 2019-08-27 | Discharge: 2019-08-27 | Disposition: A | Discharge status: Home / Self Care | Payer: Medicare Other | Source: Ambulatory Visit | Attending: Urology | Admitting: Urology

## 2019-08-27 DIAGNOSIS — C61 Malignant neoplasm of prostate: Secondary | ICD-10-CM

## 2019-08-27 NOTE — Progress Notes (Signed)
Radiation Oncology         [phone] ________________________________  Name: [name] MRN: [id number]  Date: 08/27/2019  DOB: 1940/11/27  Post Treatment Note  CC: Vivi Barrack, MD  Vivi Barrack, MD  Diagnosis:   78 y.o. gentleman with Stage T1c adenocarcinoma of the prostate with Gleason score of 3+4 and PSA of 7.94     Interval Since Last Radiation:  6 weeks  06/03/19-07/14/19:   The prostate was treated to 70 Gy in 28 fractions of 2.5 Gy  Narrative: I spoke with the patient to conduct his routine scheduled 1 month follow up visit via telephone to spare the patient unnecessary potential exposure in the healthcare setting during the current COVID-19 pandemic.  The patient was notified in advance and gave permission to proceed with this visit format. He tolerated radiation treatment relatively well with only minor urinary irritation and modest fatigue. He experienced occasional mild dysuria but reported a normal flow of stream and nocturia x3-5. He denied abdominal pain or bowel issues.                         On review of systems, the patient states that he is doing very well overall.  He reports that his LUTS have continued to gradually improve and he feels that he is close if not completely back to his baseline at this point.  He specifically denies dysuria, gross hematuria, excessive daytime frequency, straining to void, weak stream or incomplete emptying.  He continues with increased frequency and urgency which is manageable and he continues taking Flomax daily.  He denies abdominal pain, nausea, vomiting, diarrhea or constipation.  He reports a healthy appetite and is maintaining his weight.  Overall, he is quite pleased with his progress to date.  ALLERGIES:  is allergic to lovastatin and simvastatin.  Meds: Current Outpatient Medications  Medication Sig Dispense Refill  . aspirin 81 MG tablet Take 81 mg by mouth daily.      Marland Kitchen atorvastatin (LIPITOR) 80 MG tablet TAKE 1  TABLET BY MOUTH  DAILY 90 tablet 3  . fluticasone (FLONASE) 50 MCG/ACT nasal spray Place 2 sprays into both nostrils daily. 2 sprays in each nostrils as needed. 48 g 3  . ipratropium (ATROVENT) 0.06 % nasal spray USE 2 SPRAYS IN EACH  NOSTRIL 4 TIMES DAILY (Patient taking differently: as needed. ) 15 mL 0  . losartan-hydrochlorothiazide (HYZAAR) 100-25 MG tablet TAKE 1 TABLET BY MOUTH ONCE DAILY 90 tablet 3  . metFORMIN (GLUCOPHAGE-XR) 500 MG 24 hr tablet TAKE 1 TABLET BY MOUTH TWO  TIMES DAILY 180 tablet 3  . tamsulosin (FLOMAX) 0.4 MG CAPS capsule Take 0.4 mg by mouth at bedtime.     . triamcinolone ointment (KENALOG) 0.5 % APPLY 1 APPLICATION  TOPICALLY 2 (TWO) TIMES  DAILY. 30 g 0   No current facility-administered medications for this encounter.     Physical Findings:  vitals were not taken for this visit.  Pain Assessment Pain Score: 0-No pain/10 Unable to assess due to telephone follow up visit format.  Lab Findings: Lab Results  Component Value Date   WBC 7.3 10/06/2018   HGB 14.6 05/20/2019   HCT 43.0 05/20/2019   MCV 91.5 10/06/2018   PLT 215.0 10/06/2018     Radiographic Findings: No results found.  Impression/Plan: 1. 78 y.o. gentleman with Stage T1c adenocarcinoma of the prostate with Gleason score of 3+4 and PSA of 7.94. He will continue  to follow up with urology for ongoing PSA determinations and has an appointment scheduled with Dr. Lovena Neighbours on 09/21/19. He continues taking Flomax daily and understands what to expect with regards to PSA monitoring going forward. I will look forward to following his response to treatment via correspondence with urology, and would be happy to continue to participate in his care if clinically indicated. I talked to the patient about what to expect in the future, including his risk for erectile dysfunction and rectal bleeding. I encouraged him to call or return to the office if he has any questions regarding his previous radiation or  possible radiation side effects. He was comfortable with this plan and will follow up as needed.    Nicholos Johns, PA-C

## 2019-09-02 ENCOUNTER — Encounter: Payer: Self-pay | Admitting: Podiatry

## 2019-09-02 ENCOUNTER — Ambulatory Visit: Payer: Medicare Other | Admitting: Podiatry

## 2019-09-02 ENCOUNTER — Other Ambulatory Visit: Payer: Self-pay

## 2019-09-02 DIAGNOSIS — M79675 Pain in left toe(s): Secondary | ICD-10-CM

## 2019-09-02 DIAGNOSIS — M79674 Pain in right toe(s): Secondary | ICD-10-CM

## 2019-09-02 DIAGNOSIS — E119 Type 2 diabetes mellitus without complications: Secondary | ICD-10-CM

## 2019-09-02 DIAGNOSIS — B351 Tinea unguium: Secondary | ICD-10-CM

## 2019-09-02 NOTE — Progress Notes (Signed)
Complaint:  Visit Type: Patient returns to my office for continued preventative foot care services. Complaint: Patient states" my nails have grown long and thick and become painful to walk and wear shoes" Patient has been diagnosed with DM with no foot complications. The patient presents for preventative foot care services. No changes to ROS  Podiatric Exam: Vascular: dorsalis pedis and posterior tibial pulses are palpable bilateral. Capillary return is immediate. Temperature gradient is WNL. Skin turgor WNL  Sensorium: Normal Semmes Weinstein monofilament test. Normal tactile sensation bilaterally. Nail Exam: Pt has thick disfigured discolored nails with subungual debris noted bilateral entire nail hallux through fifth toenails Ulcer Exam: There is no evidence of ulcer or pre-ulcerative changes or infection. Orthopedic Exam: Muscle tone and strength are WNL. No limitations in general ROM. No crepitus or effusions noted. Foot type and digits show no abnormalities. Bony prominences are unremarkable. Skin: No Porokeratosis. No infection or ulcers  Diagnosis:  Onychomycosis, , Pain in right toe, pain in left toes  Treatment & Plan Procedures and Treatment: Consent by patient was obtained for treatment procedures.   Debridement of mycotic and hypertrophic toenails, 1 through 5 bilateral and clearing of subungual debris. No ulceration, no infection noted.  Return Visit-Office Procedure: Patient instructed to return to the office for a follow up visit 6  months for continued evaluation and treatment.    Gardiner Barefoot DPM

## 2019-09-04 ENCOUNTER — Other Ambulatory Visit: Payer: Self-pay | Admitting: Family Medicine

## 2019-11-24 DIAGNOSIS — B029 Zoster without complications: Secondary | ICD-10-CM | POA: Diagnosis not present

## 2019-11-24 DIAGNOSIS — L570 Actinic keratosis: Secondary | ICD-10-CM | POA: Diagnosis not present

## 2019-11-25 DIAGNOSIS — B029 Zoster without complications: Secondary | ICD-10-CM | POA: Diagnosis not present

## 2019-11-25 DIAGNOSIS — D485 Neoplasm of uncertain behavior of skin: Secondary | ICD-10-CM | POA: Diagnosis not present

## 2019-11-25 DIAGNOSIS — B0239 Other herpes zoster eye disease: Secondary | ICD-10-CM | POA: Diagnosis not present

## 2019-12-08 ENCOUNTER — Other Ambulatory Visit: Payer: Self-pay

## 2019-12-09 ENCOUNTER — Ambulatory Visit (INDEPENDENT_AMBULATORY_CARE_PROVIDER_SITE_OTHER): Payer: Medicare Other | Admitting: Family Medicine

## 2019-12-09 ENCOUNTER — Encounter: Payer: Self-pay | Admitting: Family Medicine

## 2019-12-09 VITALS — BP 134/76 | HR 66 | Temp 97.1°F | Ht 68.0 in | Wt 187.2 lb

## 2019-12-09 DIAGNOSIS — I10 Essential (primary) hypertension: Secondary | ICD-10-CM

## 2019-12-09 DIAGNOSIS — E785 Hyperlipidemia, unspecified: Secondary | ICD-10-CM

## 2019-12-09 DIAGNOSIS — G610 Guillain-Barre syndrome: Secondary | ICD-10-CM | POA: Diagnosis not present

## 2019-12-09 DIAGNOSIS — E119 Type 2 diabetes mellitus without complications: Secondary | ICD-10-CM | POA: Diagnosis not present

## 2019-12-09 DIAGNOSIS — E1169 Type 2 diabetes mellitus with other specified complication: Secondary | ICD-10-CM

## 2019-12-09 DIAGNOSIS — Z634 Disappearance and death of family member: Secondary | ICD-10-CM

## 2019-12-09 DIAGNOSIS — E1159 Type 2 diabetes mellitus with other circulatory complications: Secondary | ICD-10-CM | POA: Diagnosis not present

## 2019-12-09 DIAGNOSIS — I152 Hypertension secondary to endocrine disorders: Secondary | ICD-10-CM

## 2019-12-09 LAB — POCT GLYCOSYLATED HEMOGLOBIN (HGB A1C): Hemoglobin A1C: 5.9 % — AB (ref 4.0–5.6)

## 2019-12-09 NOTE — Progress Notes (Signed)
   [name] is a 79 y.o. male who presents today for an office visit.  Assessment/Plan:  New/Acute Problems: Bereavement All forms of encouragement.  Discussed additional resources including counseling however patient declined.  Chronic Problems Addressed Today: Hypertension associated with diabetes ([place]) At goal.  Continue losartan-HCTZ 100-25 mg once daily.  Check C met next blood draw.  Dyslipidemia associated with type 2 diabetes mellitus (H[facility]) Continue lovastatin 80 mg daily.  Check lipid panel next blood draw.  Diabetes ([name]) A1c stable at 5.9.  We will continue Metformin 500 mg twice daily.  Check A1c when he comes in for his CPE in 6 months.  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  Discussed with patient there have been no documented Guillain-Barr cases thus far per [employer].  Patient still continues to decline COVID-19 vaccine.     Subjective:  HPI:  Patient has been selling with some bereavement as his wife passed away about 6 months ago.  Still has bad days.  Has a good support system in place.  See A/P for status of chronic conditions.        Objective:  Physical Exam: BP 134/76   Pulse 66   Temp (!) 97.1 F (36.2 C)   Ht 5' 8"  (1.727 m)   Wt 187 lb 3.2 oz (84.9 kg)   SpO2 99%   BMI 28.46 kg/m   Gen: No acute distress, resting comfortably CV: Regular rate and rhythm with no murmurs appreciated Pulm: Normal work of breathing, clear to auscultation bilaterally with no crackles, wheezes, or rhonchi Neuro: Grossly normal, moves all extremities Psych: Normal affect and thought content      [name] M. [name], MD 12/09/2019 9:47 AM

## 2019-12-09 NOTE — Assessment & Plan Note (Signed)
At goal.  Continue losartan-HCTZ 100-25 mg once daily.  Check C met next blood draw.

## 2019-12-09 NOTE — Assessment & Plan Note (Signed)
Discussed COVID-19 vaccine.  Discussed with patient there have been no documented Guillain-Barr cases thus far per [employer].  Patient still continues to decline COVID-19 vaccine.

## 2019-12-09 NOTE — Patient Instructions (Signed)
It was very nice to see you today!  No changes today.  Come back in 6 months for your check up with blood work, or sooner if needed.   Take care, Dr [name]  Please try these tips to maintain a healthy lifestyle:   Eat at least 3 REAL meals and 1-2 snacks per day.  Aim for no more than 5 hours between eating.  If you eat breakfast, please do so within one hour of getting up.    Each meal should contain half fruits/vegetables, one quarter protein, and one quarter carbs (no bigger than a computer mouse)   Cut down on sweet beverages. This includes juice, soda, and sweet tea.     Drink at least 1 glass of water with each meal and aim for at least 8 glasses per day   Exercise at least 150 minutes every week.

## 2019-12-09 NOTE — Assessment & Plan Note (Signed)
Continue lovastatin 80 mg daily.  Check lipid panel next blood draw.

## 2019-12-09 NOTE — Assessment & Plan Note (Signed)
A1c stable at 5.9.  We will continue Metformin 500 mg twice daily.  Check A1c when he comes in for his CPE in 6 months.

## 2019-12-28 DIAGNOSIS — L819 Disorder of pigmentation, unspecified: Secondary | ICD-10-CM | POA: Diagnosis not present

## 2019-12-28 DIAGNOSIS — L570 Actinic keratosis: Secondary | ICD-10-CM | POA: Diagnosis not present

## 2020-01-15 ENCOUNTER — Other Ambulatory Visit: Payer: Self-pay

## 2020-01-15 ENCOUNTER — Ambulatory Visit (INDEPENDENT_AMBULATORY_CARE_PROVIDER_SITE_OTHER): Payer: Medicare Other

## 2020-01-15 DIAGNOSIS — Z0000 Encounter for general adult medical examination without abnormal findings: Secondary | ICD-10-CM

## 2020-01-15 NOTE — Progress Notes (Signed)
This visit is being conducted via phone call due to the COVID-19 pandemic. This patient has given me verbal consent via phone to conduct this visit, patient states they are participating from their home address. Some vital signs may be absent or patient reported.   Patient identification: identified by name, DOB, and current address.  Location provider: [facility], Office Persons participating in the virtual visit: Denman George LPN, patient, and Dr. Garret Reddish     Subjective:   [name] is a 79 y.o. male who presents for Medicare Annual/Subsequent preventive examination.  Review of Systems:   Cardiac Risk Factors include: advanced age (>32mn, >>76women);male gender;hypertension;dyslipidemia;diabetes mellitus    Objective:    Vitals: There were no vitals taken for this visit.  There is no height or weight on file to calculate BMI.  Advanced Directives 01/15/2020 05/20/2019 05/20/2019 03/24/2019 12/12/2018 05/24/2017 02/23/2016  Does Patient Have a Medical Advance Directive? Yes - Yes Yes Yes No No  Type of Advance Directive Living will;Healthcare Power of AFalknerof AStandardLiving will - -  Does patient want to make changes to medical advance directive? No - Patient declined - No - Patient declined No - Patient declined No - Patient declined - -  Copy of HCanistotain Chart? No - copy requested - No - copy requested - No - copy requested - -  Would patient like information on creating a medical advance directive? - - No - Patient declined - - - -    Tobacco Social History   Tobacco Use  Smoking Status Former Smoker  . Packs/day: 1.00  . Years: 15.00  . Pack years: 15.00  . Types: Cigarettes  . Quit date: 11/05/1981  . Years since quitting: 38.2  Smokeless Tobacco Never Used     Counseling given: Not Answered   Clinical Intake:  Pre-visit  preparation completed: Yes  Pain : No/denies pain  Diabetes: Yes CBG done?: No Did pt. bring in CBG monitor from home?: No  How often do you need to have someone help you when you read instructions, pamphlets, or other written materials from your doctor or pharmacy?: 1 - Never  Interpreter Needed?: No  Information entered by :: CDenman GeorgeLPN  Past Medical History:  Diagnosis Date  . CKD (chronic kidney disease), stage III   . Dyslipidemia   . ED (erectile dysfunction)   . History of Guillain-Barre syndrome    per pt 1990s fron Flu Vaccine,  stated resolved with no residual  . History of nuclear stress test    08-23-2004  by dr kRon Parker-- normal without ischemia,  ef 68%  . Hyperplasia of prostate with lower urinary tract symptoms (LUTS)   . Hypertension   . NASH (nonalcoholic steatohepatitis) 2010   followed by pcp  . Prostate cancer ([facility] urologist-- dr winter;  oncologist-- dr mTammi Klippel  dx 02-26-2019,  Stage T1c,  Gleason 3+4, vol 80.1cc;  plan external radiation  . Rhinorrhea   . Type 2 diabetes mellitus ([place]    Past Surgical History:  Procedure Laterality Date  . CARPAL TUNNEL RELEASE Right 1990s  . GOLD SEED IMPLANT N/A 05/20/2019   Procedure: GOLD SEED IMPLANT;  Surgeon: WCeasar Mons MD;  [facility]  Service: Urology;  Laterality: N/A;  . HERNIA REPAIR  infant  . prostate biopsy  02-26-2019  dr winter office  . SPACE OAR  INSTILLATION N/A 05/20/2019   Procedure: SPACE OAR INSTILLATION;  Surgeon: Ceasar Mons, MD;  [facility];  Service: Urology;  Laterality: N/A;  . TONSILLECTOMY  child   Family History  Problem Relation Age of Onset  . Congestive Heart Failure Mother   . Cirrhosis Father   . Cancer Neg Hx    Social History   Socioeconomic History  . Marital status: Widowed    Spouse name: Not on file  . Number of children: 2  . Years of education: Not on file  . Highest  education level: Not on file  Occupational History  . Occupation: Retired  Tobacco Use  . Smoking status: Former Smoker    Packs/day: 1.00    Years: 15.00    Pack years: 15.00    Types: Cigarettes    Quit date: 11/05/1981    Years since quitting: 38.2  . Smokeless tobacco: Never Used  Substance and Sexual Activity  . Alcohol use: No    Alcohol/week: 0.0 standard drinks  . Drug use: Never  . Sexual activity: Not Currently  Other Topics Concern  . Not on file  Social History Narrative   Lives alone   Drives    Social Determinants of Health   Financial Resource Strain:   . Difficulty of Paying Living Expenses:   Food Insecurity:   . Worried About Charity fundraiser in the Last Year:   . Arboriculturist in the Last Year:   Transportation Needs:   . Film/video editor (Medical):   Marland Kitchen Lack of Transportation (Non-Medical):   Physical Activity:   . Days of Exercise per Week:   . Minutes of Exercise per Session:   Stress:   . Feeling of Stress :   Social Connections:   . Frequency of Communication with Friends and Family:   . Frequency of Social Gatherings with Friends and Family:   . Attends Religious Services:   . Active Member of Clubs or Organizations:   . Attends Archivist Meetings:   Marland Kitchen Marital Status:     Outpatient Encounter Medications as of 01/15/2020  Medication Sig  . aspirin 81 MG tablet Take 81 mg by mouth daily.    Marland Kitchen atorvastatin (LIPITOR) 80 MG tablet TAKE 1 TABLET BY MOUTH  DAILY  . fluticasone (FLONASE) 50 MCG/ACT nasal spray Place 2 sprays into both nostrils daily. 2 sprays in each nostrils as needed.  Marland Kitchen ipratropium (ATROVENT) 0.06 % nasal spray USE 2 SPRAYS IN EACH  NOSTRIL 4 TIMES DAILY (Patient taking differently: as needed. )  . losartan-hydrochlorothiazide (HYZAAR) 100-25 MG tablet TAKE 1 TABLET BY MOUTH  DAILY  . metFORMIN (GLUCOPHAGE-XR) 500 MG 24 hr tablet TAKE 1 TABLET BY MOUTH TWO  TIMES DAILY  . tamsulosin (FLOMAX) 0.4 MG CAPS  capsule Take 0.4 mg by mouth at bedtime.   . triamcinolone ointment (KENALOG) 0.5 % APPLY 1 APPLICATION  TOPICALLY 2 (TWO) TIMES  DAILY.   No facility-administered encounter medications on file as of 01/15/2020.    Activities of Daily Living In your present state of health, do you have any difficulty performing the following activities: 01/15/2020 05/20/2019  Hearing? N -  Vision? N -  Difficulty concentrating or making decisions? N -  Walking or climbing stairs? N -  Dressing or bathing? N -  Doing errands, shopping? N N  Preparing Food and eating ? N -  Using the Toilet? N -  In the past six months,  have you accidently leaked urine? N -  Comment followed by urology -  Do you have problems with loss of bowel control? N -  Managing your Medications? N -  Managing your Finances? N -  Housekeeping or managing your Housekeeping? N -  Some recent data might be hidden    Patient Care Team: Vivi Barrack, MD as PCP - General (Family Medicine) Rutherford Guys, MD as Consulting Physician (Ophthalmology) Cira Rue, RN Nurse Navigator as Registered Nurse (Medical Oncology) Ceasar Mons, MD as Consulting Physician (Urology) Gardiner Barefoot, DPM as Consulting Physician (Podiatry)   Assessment:   This is a routine wellness examination for Min.  Exercise Activities and Dietary recommendations Current Exercise Habits: Home exercise routine, Type of exercise: walking, Time (Minutes): 30, Frequency (Times/Week): 4, Weekly Exercise (Minutes/Week): 120, Intensity: Mild  Goals    . Patient Stated     Maintain current health staus       Fall Risk Fall Risk  01/15/2020 06/08/2019 12/12/2018  Falls in the past year? 0 0 0  Number falls in past yr: 0 - -  Injury with Fall? 0 - -  Follow up Falls evaluation completed;Education provided;Falls prevention discussed - -   Is the patient's home free of loose throw rugs in walkways, pet beds, electrical cords, etc?   yes      Grab bars in  the bathroom? yes      Handrails on the stairs?   yes      Adequate lighting?   yes  Depression Screen PHQ 2/9 Scores 01/15/2020 06/08/2019 06/08/2019 12/12/2018  PHQ - 2 Score 1 0 0 0  PHQ- 9 Score - 1 - -    Cognitive Function MMSE - Mini Mental State Exam 12/12/2018  Orientation to time 5  Orientation to Place 5  Registration 3  Attention/ Calculation 3  Recall 3  Language- name 2 objects 2  Language- repeat 1  Language- follow 3 step command 3  Language- read & follow direction 1  Write a sentence 1  Copy design 1  Total score 28     6CIT Screen 01/15/2020  What Year? 0 points  What month? 0 points  What time? 0 points  Count back from 20 0 points  Months in reverse 0 points  Repeat phrase 0 points  Total Score 0    Immunization History  Administered Date(s) Administered  . Pneumococcal Conjugate-13 02/23/2016  . Pneumococcal Polysaccharide-23 11/15/2008  . Td 08/06/2003, 02/20/2013  . Zoster 12/27/2014    Qualifies for Shingles Vaccine? Discussed and patient will check with pharmacy for coverage.  Patient education handout provided   Screening Tests Health Maintenance  Topic Date Due  . OPHTHALMOLOGY EXAM  12/03/2015  . FOOT EXAM  06/07/2020  . HEMOGLOBIN A1C  06/07/2020  . TETANUS/TDAP  02/21/2023  . PNA vac Low Risk Adult  Completed  . INFLUENZA VACCINE  Discontinued   Cancer Screenings: Lung: Low Dose CT Chest recommended if Age 82-80 years, 30 pack-year currently smoking OR have quit w/in 15years. Patient does not qualify. Colorectal: No longer indicated      Plan:  I have personally reviewed and addressed the Medicare Annual Wellness questionnaire and have noted the following in the patient's chart:  A. Medical and social history B. Use of alcohol, tobacco or illicit drugs  C. Current medications and supplements D. Functional ability and status E.  Nutritional status F.  Physical activity G. Advance directives H. List of other physicians I.  Hospitalizations, surgeries, and ER visits in previous 12 months J.  [place] such as hearing and vision if needed, cognitive and depression L. Referrals, records requested, and appointments- none   In addition, I have reviewed and discussed with patient certain preventive protocols, quality metrics, and best practice recommendations. A written personalized care plan for preventive services as well as general preventive health recommendations were provided to patient.   Signed,  Denman George, LPN  Nurse Health Advisor   Nurse Notes: no additional

## 2020-01-15 NOTE — Patient Instructions (Signed)
[name] , Thank you for taking time to come for your Medicare Wellness Visit. I appreciate your ongoing commitment to your health goals. Please review the following plan we discussed and let me know if I can assist you in the future.   Screening recommendations/referrals: Colorectal Screening: No longer indicated   Vision and Dental Exams: Recommended annual ophthalmology exams for early detection of glaucoma and other disorders of the eye Recommended annual dental exams for proper oral hygiene  Diabetic Exams: Diabetic Eye Exam: recommended yearly; up to date  Diabetic Foot Exam: recommended yearly; up to date   Vaccinations: Influenza vaccine: Not indicated  Pneumococcal vaccine: up to date; last 02/23/16  Tdap vaccine: up to date; last 02/20/13  Shingles vaccine: You may receive this vaccine at your local pharmacy. (see handout)   Advanced directives: Please bring a copy of your POA (Power of Attorney) and/or Living Will to your next appointment.  Goals: Recommend to drink at least 6-8 8oz glasses of water per day and consume a balanced diet rich in fresh fruits and vegetables.   Next appointment: Please schedule your Annual Wellness Visit with your Nurse Health Advisor in one year.  Preventive Care Male Preventive care refers to lifestyle choices and visits with your health care provider that can promote health and wellness. What does preventive care include?    A yearly physical exam. This is also called an annual well check.  Dental exams once or twice a year.  Routine eye exams. Ask your health care provider how often you should have your eyes checked.  Personal lifestyle choices, including:  Daily care of your teeth and gums.  Regular physical activity.  Eating a healthy diet.  Avoiding tobacco and drug use.  Limiting alcohol use.  Practicing safe sex.  Taking low-dose aspirin every day starting at age 47 if recommended by your health care  provider.  Taking vitamin and mineral supplements as recommended by your health care provider. What happens during an annual well check? The services and screenings done by your health care provider during your annual well check will depend on your age, overall health, lifestyle risk factors, and family history of disease. Counseling  Your health care provider may ask you questions about your:  Alcohol use.  Tobacco use.  Drug use.  Emotional well-being.  Home and relationship well-being.  Sexual activity.  Eating habits.  Work and work Statistician. Screening  You may have the following tests or measurements:  Height, weight, and BMI.  Blood pressure.  Lipid and cholesterol levels. These may be checked every 5 years, or more frequently if you are over 16 years old.  Skin check.  Lung cancer screening. You may have this screening every year starting at age 46 if you have a 30-pack-year history of smoking and currently smoke or have quit within the past 15 years.  Fecal occult blood test (FOBT) of the stool. You may have this test every year starting at age 5.  Flexible sigmoidoscopy or colonoscopy. You may have a sigmoidoscopy every 5 years or a colonoscopy every 10 years starting at age 52.  Prostate cancer screening. Recommendations will vary depending on your family history and other risks.  Hepatitis C blood test.  Hepatitis B blood test.  Sexually transmitted disease (STD) testing.  Diabetes screening. This is done by checking your blood sugar (glucose) after you have not eaten for a while (fasting). You may have this done every 1-3 years. Discuss your test results, treatment options,  and if necessary, the need for more tests with your health care provider. Vaccines  Your health care provider may recommend certain vaccines, such as:  Influenza vaccine. This is recommended every year.  Tetanus, diphtheria, and acellular pertussis (Tdap, Td) vaccine. You may  need a Td booster every 10 years.  Zoster vaccine. You may need this after age 8.  Pneumococcal 13-valent conjugate (PCV13) vaccine. You may need this if you have certain conditions and have not been vaccinated.  Pneumococcal polysaccharide (PPSV23) vaccine. You may need one or two doses if you smoke cigarettes or if you have certain conditions. Talk to your health care provider about which screenings and vaccines you need and how often you need them. This information is not intended to replace advice given to you by your health care provider. Make sure you discuss any questions you have with your health care provider. Document Released: 11/18/2015 Document Revised: 07/11/2016 Document Reviewed: 08/23/2015 Elsevier Interactive Patient Education  2017 [place] Prevention in the Home Falls can cause injuries. They can happen to people of all ages. There are many things you can do to make your home safe and to help prevent falls. What can I do on the outside of my home?  Regularly fix the edges of walkways and driveways and fix any cracks.  Remove anything that might make you trip as you walk through a door, such as a raised step or threshold.  Trim any bushes or trees on the path to your home.  Use bright outdoor lighting.  Clear any walking paths of anything that might make someone trip, such as rocks or tools.  Regularly check to see if handrails are loose or broken. Make sure that both sides of any steps have handrails.  Any raised decks and porches should have guardrails on the edges.  Have any leaves, snow, or ice cleared regularly.  Use sand or salt on walking paths during winter.  Clean up any spills in your garage right away. This includes oil or grease spills. What can I do in the bathroom?  Use night lights.  Install grab bars by the toilet and in the tub and shower. Do not use towel bars as grab bars.  Use non-skid mats or decals in the tub or  shower.  If you need to sit down in the shower, use a plastic, non-slip stool.  Keep the floor dry. Clean up any water that spills on the floor as soon as it happens.  Remove soap buildup in the tub or shower regularly.  Attach bath mats securely with double-sided non-slip rug tape.  Do not have throw rugs and other things on the floor that can make you trip. What can I do in the bedroom?  Use night lights.  Make sure that you have a light by your bed that is easy to reach.  Do not use any sheets or blankets that are too big for your bed. They should not hang down onto the floor.  Have a firm chair that has side arms. You can use this for support while you get dressed.  Do not have throw rugs and other things on the floor that can make you trip. What can I do in the kitchen?  Clean up any spills right away.  Avoid walking on wet floors.  Keep items that you use a lot in easy-to-reach places.  If you need to reach something above you, use a strong step stool that has a grab  bar.  Keep electrical cords out of the way.  Do not use floor polish or wax that makes floors slippery. If you must use wax, use non-skid floor wax.  Do not have throw rugs and other things on the floor that can make you trip. What can I do with my stairs?  Do not leave any items on the stairs.  Make sure that there are handrails on both sides of the stairs and use them. Fix handrails that are broken or loose. Make sure that handrails are as long as the stairways.  Check any carpeting to make sure that it is firmly attached to the stairs. Fix any carpet that is loose or worn.  Avoid having throw rugs at the top or bottom of the stairs. If you do have throw rugs, attach them to the floor with carpet tape.  Make sure that you have a light switch at the top of the stairs and the bottom of the stairs. If you do not have them, ask someone to add them for you. What else can I do to help prevent  falls?  Wear shoes that:  Do not have high heels.  Have rubber bottoms.  Are comfortable and fit you well.  Are closed at the toe. Do not wear sandals.  If you use a stepladder:  Make sure that it is fully opened. Do not climb a closed stepladder.  Make sure that both sides of the stepladder are locked into place.  Ask someone to hold it for you, if possible.  Clearly mark and make sure that you can see:  Any grab bars or handrails.  First and last steps.  Where the edge of each step is.  Use tools that help you move around (mobility aids) if they are needed. These include:  Canes.  Walkers.  Scooters.  Crutches.  Turn on the lights when you go into a dark area. Replace any light bulbs as soon as they burn out.  Set up your furniture so you have a clear path. Avoid moving your furniture around.  If any of your floors are uneven, fix them.  If there are any pets around you, be aware of where they are.  Review your medicines with your doctor. Some medicines can make you feel dizzy. This can increase your chance of falling. Ask your doctor what other things that you can do to help prevent falls. This information is not intended to replace advice given to you by your health care provider. Make sure you discuss any questions you have with your health care provider. Document Released: 08/18/2009 Document Revised: 03/29/2016 Document Reviewed: 11/26/2014 Elsevier Interactive Patient Education  2017 [employer].

## 2020-01-18 DIAGNOSIS — L578 Other skin changes due to chronic exposure to nonionizing radiation: Secondary | ICD-10-CM | POA: Diagnosis not present

## 2020-03-02 ENCOUNTER — Ambulatory Visit: Payer: Medicare Other | Admitting: Podiatry

## 2020-03-02 ENCOUNTER — Encounter: Payer: Self-pay | Admitting: Podiatry

## 2020-03-02 ENCOUNTER — Other Ambulatory Visit: Payer: Self-pay

## 2020-03-02 VITALS — Temp 97.2°F

## 2020-03-02 DIAGNOSIS — M79674 Pain in right toe(s): Secondary | ICD-10-CM | POA: Diagnosis not present

## 2020-03-02 DIAGNOSIS — B351 Tinea unguium: Secondary | ICD-10-CM

## 2020-03-02 DIAGNOSIS — E119 Type 2 diabetes mellitus without complications: Secondary | ICD-10-CM

## 2020-03-02 DIAGNOSIS — M79675 Pain in left toe(s): Secondary | ICD-10-CM | POA: Diagnosis not present

## 2020-03-02 NOTE — Progress Notes (Signed)
This patient returns to my office for at risk foot care.  This patient requires this care by a professional since this patient will be at risk due to having diabetes.  This patient is unable to cut nails himself since the patient cannot reach his nails.These nails are painful walking and wearing shoes.  This patient presents for at risk foot care today.  General Appearance  Alert, conversant and in no acute stress.  Vascular  Dorsalis pedis and posterior tibial  pulses are palpable  bilaterally.  Capillary return is within normal limits  bilaterally. Temperature is within normal limits  bilaterally.  Neurologic  Senn-Weinstein monofilament wire test within normal limits  bilaterally. Muscle power within normal limits bilaterally.  Nails Thick disfigured discolored nails with subungual debris  from hallux to fifth toes bilaterally. No evidence of bacterial infection or drainage bilaterally.  Orthopedic  No limitations of motion  feet .  No crepitus or effusions noted.  No bony pathology or digital deformities noted.  Skin  normotropic skin with no porokeratosis noted bilaterally.  No signs of infections or ulcers noted.     Onychomycosis  Pain in right toes  Pain in left toes  Consent was obtained for treatment procedures.   Mechanical debridement of nails 1-5  bilaterally performed with a nail nipper.  Filed with dremel without incident.    Return office visit    6 months                  Told patient to return for periodic foot care and evaluation due to potential at risk complications.   Gardiner Barefoot DPM

## 2020-03-07 DIAGNOSIS — R310 Gross hematuria: Secondary | ICD-10-CM | POA: Diagnosis not present

## 2020-04-16 ENCOUNTER — Other Ambulatory Visit: Payer: Self-pay | Admitting: Family Medicine

## 2020-06-06 ENCOUNTER — Ambulatory Visit (INDEPENDENT_AMBULATORY_CARE_PROVIDER_SITE_OTHER): Payer: Medicare Other | Admitting: Family Medicine

## 2020-06-06 ENCOUNTER — Other Ambulatory Visit: Payer: Self-pay

## 2020-06-06 ENCOUNTER — Encounter: Payer: Self-pay | Admitting: Family Medicine

## 2020-06-06 VITALS — BP 124/60 | HR 57 | Temp 97.5°F | Resp 18 | Ht 68.0 in | Wt 195.8 lb

## 2020-06-06 DIAGNOSIS — G610 Guillain-Barre syndrome: Secondary | ICD-10-CM

## 2020-06-06 DIAGNOSIS — I10 Essential (primary) hypertension: Secondary | ICD-10-CM | POA: Diagnosis not present

## 2020-06-06 DIAGNOSIS — E119 Type 2 diabetes mellitus without complications: Secondary | ICD-10-CM

## 2020-06-06 DIAGNOSIS — E1159 Type 2 diabetes mellitus with other circulatory complications: Secondary | ICD-10-CM | POA: Diagnosis not present

## 2020-06-06 DIAGNOSIS — I152 Hypertension secondary to endocrine disorders: Secondary | ICD-10-CM

## 2020-06-06 LAB — POCT GLYCOSYLATED HEMOGLOBIN (HGB A1C): Hemoglobin A1C: 6 % — AB (ref 4.0–5.6)

## 2020-06-06 NOTE — Progress Notes (Signed)
   [name] is a 79 y.o. male who presents today for an office visit.  Assessment/Plan:  Chronic Problems Addressed Today: Hypertension associated with diabetes ([place]) At goal.  Continue losartan-HCTZ 100-25 once daily.  Will check CMET next blood draw.  Diabetes ([place]) A1c stable at 6.0.  Continue Januvia 100 mg daily and metformin 500 mg twice daily.  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     Subjective:  HPI:  See A/p.         Objective:  Physical Exam: BP (!) 124/60   Pulse 57   Temp (!) 97.5 F (36.4 C) (Temporal)   Resp 18   Ht 5' 8"  (1.727 m)   Wt 195 lb 12.8 oz (88.8 kg)   SpO2 97%   BMI 29.77 kg/m   Wt Readings from Last 3 Encounters:  06/06/20 195 lb 12.8 oz (88.8 kg)  12/09/19 187 lb 3.2 oz (84.9 kg)  06/08/19 183 lb 6.1 oz (83.2 kg)    Gen: No acute distress, resting comfortably CV: Regular rate and rhythm with no murmurs appreciated Pulm: Normal work of breathing, clear to auscultation bilaterally with no crackles, wheezes, or rhonchi Neuro: Grossly normal, moves all extremities Psych: Normal affect and thought content      [name] M. [name], MD 06/06/2020 10:17 AM

## 2020-06-06 NOTE — Assessment & Plan Note (Signed)
Patient does not want COVID-19 vaccine due to history of Guillain-Barr.

## 2020-06-06 NOTE — Assessment & Plan Note (Signed)
A1c stable at 6.0.  Continue Januvia 100 mg daily and metformin 500 mg twice daily.

## 2020-06-06 NOTE — Assessment & Plan Note (Signed)
At goal.  Continue losartan-HCTZ 100-25 once daily.  Will check CMET next blood draw.

## 2020-06-06 NOTE — Patient Instructions (Addendum)
It was very nice to see you today!  Your blood sugar looks great.   No medication changes today.  Please come back in 6 months for your annual checkup with blood work.  Please come back to me sooner if needed.  Take care, Dr [name]  Please try these tips to maintain a healthy lifestyle:   Eat at least 3 REAL meals and 1-2 snacks per day.  Aim for no more than 5 hours between eating.  If you eat breakfast, please do so within one hour of getting up.    Each meal should contain half fruits/vegetables, one quarter protein, and one quarter carbs (no bigger than a computer mouse)   Cut down on sweet beverages. This includes juice, soda, and sweet tea.     Drink at least 1 glass of water with each meal and aim for at least 8 glasses per day   Exercise at least 150 minutes every week.

## 2020-06-09 DIAGNOSIS — D485 Neoplasm of uncertain behavior of skin: Secondary | ICD-10-CM | POA: Diagnosis not present

## 2020-06-09 DIAGNOSIS — B079 Viral wart, unspecified: Secondary | ICD-10-CM | POA: Diagnosis not present

## 2020-06-09 DIAGNOSIS — L570 Actinic keratosis: Secondary | ICD-10-CM | POA: Diagnosis not present

## 2020-06-09 DIAGNOSIS — L819 Disorder of pigmentation, unspecified: Secondary | ICD-10-CM | POA: Diagnosis not present

## 2020-06-10 ENCOUNTER — Other Ambulatory Visit: Payer: Self-pay | Admitting: Family Medicine

## 2020-07-28 ENCOUNTER — Other Ambulatory Visit: Payer: Self-pay | Admitting: Family Medicine

## 2020-08-16 DIAGNOSIS — M545 Low back pain, unspecified: Secondary | ICD-10-CM | POA: Diagnosis not present

## 2020-08-16 DIAGNOSIS — S32010A Wedge compression fracture of first lumbar vertebra, initial encounter for closed fracture: Secondary | ICD-10-CM | POA: Diagnosis not present

## 2020-08-17 DIAGNOSIS — S32010A Wedge compression fracture of first lumbar vertebra, initial encounter for closed fracture: Secondary | ICD-10-CM | POA: Diagnosis not present

## 2020-08-17 DIAGNOSIS — S32019A Unspecified fracture of first lumbar vertebra, initial encounter for closed fracture: Secondary | ICD-10-CM | POA: Diagnosis not present

## 2020-08-30 DIAGNOSIS — M8008XA Age-related osteoporosis with current pathological fracture, vertebra(e), initial encounter for fracture: Secondary | ICD-10-CM | POA: Diagnosis not present

## 2020-08-31 ENCOUNTER — Other Ambulatory Visit: Payer: Self-pay | Admitting: Family Medicine

## 2020-09-02 ENCOUNTER — Ambulatory Visit: Payer: Medicare Other | Admitting: Podiatry

## 2020-09-02 ENCOUNTER — Encounter: Payer: Self-pay | Admitting: Podiatry

## 2020-09-02 ENCOUNTER — Other Ambulatory Visit: Payer: Self-pay

## 2020-09-02 DIAGNOSIS — M79674 Pain in right toe(s): Secondary | ICD-10-CM

## 2020-09-02 DIAGNOSIS — B351 Tinea unguium: Secondary | ICD-10-CM

## 2020-09-02 DIAGNOSIS — E119 Type 2 diabetes mellitus without complications: Secondary | ICD-10-CM | POA: Diagnosis not present

## 2020-09-02 DIAGNOSIS — M79675 Pain in left toe(s): Secondary | ICD-10-CM | POA: Diagnosis not present

## 2020-09-02 NOTE — Progress Notes (Signed)
This patient returns to my office for at risk foot care.  This patient requires this care by a professional since this patient will be at risk due to having diabetes.  This patient is unable to cut nails himself since the patient cannot reach his nails.These nails are painful walking and wearing shoes.  This patient presents for at risk foot care today.  General Appearance  Alert, conversant and in no acute stress.  Vascular  Dorsalis pedis and posterior tibial  pulses are palpable  bilaterally.  Capillary return is within normal limits  bilaterally. Temperature is within normal limits  bilaterally.  Neurologic  Senn-Weinstein monofilament wire test within normal limits  bilaterally. Muscle power within normal limits bilaterally.  Nails Thick disfigured discolored nails with subungual debris  from hallux to fifth toes bilaterally. No evidence of bacterial infection or drainage bilaterally.  Orthopedic  No limitations of motion  feet .  No crepitus or effusions noted.  No bony pathology or digital deformities noted.  Skin  normotropic skin with no porokeratosis noted bilaterally.  No signs of infections or ulcers noted.     Onychomycosis  Pain in right toes  Pain in left toes  Consent was obtained for treatment procedures.   Mechanical debridement of nails 1-5  bilaterally performed with a nail nipper.  Filed with dremel without incident.    Return office visit    9 months                  Told patient to return for periodic foot care and evaluation due to potential at risk complications.   Gardiner Barefoot DPM

## 2020-09-08 DIAGNOSIS — R310 Gross hematuria: Secondary | ICD-10-CM | POA: Diagnosis not present

## 2020-09-20 DIAGNOSIS — S32010A Wedge compression fracture of first lumbar vertebra, initial encounter for closed fracture: Secondary | ICD-10-CM | POA: Diagnosis not present

## 2020-12-07 ENCOUNTER — Other Ambulatory Visit: Payer: Self-pay

## 2020-12-07 ENCOUNTER — Encounter: Payer: Self-pay | Admitting: Family Medicine

## 2020-12-07 ENCOUNTER — Telehealth: Payer: Self-pay

## 2020-12-07 ENCOUNTER — Ambulatory Visit (INDEPENDENT_AMBULATORY_CARE_PROVIDER_SITE_OTHER): Payer: Medicare Other | Admitting: Family Medicine

## 2020-12-07 VITALS — BP 127/70 | HR 75 | Temp 97.3°F | Ht 68.0 in | Wt 185.8 lb

## 2020-12-07 DIAGNOSIS — G610 Guillain-Barre syndrome: Secondary | ICD-10-CM | POA: Diagnosis not present

## 2020-12-07 DIAGNOSIS — J3489 Other specified disorders of nose and nasal sinuses: Secondary | ICD-10-CM

## 2020-12-07 DIAGNOSIS — E119 Type 2 diabetes mellitus without complications: Secondary | ICD-10-CM

## 2020-12-07 DIAGNOSIS — E1159 Type 2 diabetes mellitus with other circulatory complications: Secondary | ICD-10-CM

## 2020-12-07 DIAGNOSIS — E1169 Type 2 diabetes mellitus with other specified complication: Secondary | ICD-10-CM

## 2020-12-07 DIAGNOSIS — I152 Hypertension secondary to endocrine disorders: Secondary | ICD-10-CM | POA: Diagnosis not present

## 2020-12-07 DIAGNOSIS — C61 Malignant neoplasm of prostate: Secondary | ICD-10-CM

## 2020-12-07 DIAGNOSIS — E785 Hyperlipidemia, unspecified: Secondary | ICD-10-CM | POA: Diagnosis not present

## 2020-12-07 LAB — POCT GLYCOSYLATED HEMOGLOBIN (HGB A1C): Hemoglobin A1C: 6.1 % — AB (ref 4.0–5.6)

## 2020-12-07 LAB — LIPID PANEL
Cholesterol: 111 mg/dL (ref 0–200)
HDL: 47.2 mg/dL (ref >39.00)
LDL Cholesterol: 47 mg/dL (ref 0–99)
NonHDL: 63.65
Total CHOL/HDL Ratio: 2
Triglycerides: 83 mg/dL (ref 0.0–149.0)
VLDL: 16.6 mg/dL (ref 0.0–40.0)

## 2020-12-07 LAB — COMPREHENSIVE METABOLIC PANEL
ALT: 21 U/L (ref 0–53)
AST: 21 U/L (ref 0–37)
Albumin: 4.1 g/dL (ref 3.5–5.2)
Alkaline Phosphatase: 112 U/L (ref 39–117)
BUN: 28 mg/dL — ABNORMAL HIGH (ref 6–23)
CO2: 28 mEq/L (ref 19–32)
Calcium: 9.5 mg/dL (ref 8.4–10.5)
Chloride: 103 mEq/L (ref 96–112)
Creatinine, Ser: 1.47 mg/dL (ref 0.40–1.50)
GFR: 45.36 mL/min — ABNORMAL LOW (ref >60.00)
Glucose, Bld: 85 mg/dL (ref 70–99)
Potassium: 3.9 mEq/L (ref 3.5–5.1)
Sodium: 138 mEq/L (ref 135–145)
Total Bilirubin: 0.5 mg/dL (ref 0.2–1.2)
Total Protein: 7.1 g/dL (ref 6.0–8.3)

## 2020-12-07 LAB — CBC
HCT: 45.7 % (ref 39.0–52.0)
Hemoglobin: 15.2 g/dL (ref 13.0–17.0)
MCHC: 33.3 g/dL (ref 30.0–36.0)
MCV: 92.8 fl (ref 78.0–100.0)
Platelets: 202 10*3/uL (ref 150.0–400.0)
RBC: 4.93 Mil/uL (ref 4.22–5.81)
RDW: 14.6 % (ref 11.5–15.5)
WBC: 6.9 10*3/uL (ref 4.0–10.5)

## 2020-12-07 LAB — TSH: TSH: 1.31 u[IU]/mL (ref 0.35–4.50)

## 2020-12-07 MED ORDER — METFORMIN HCL ER 500 MG PO TB24: 500.0000 mg | ORAL_TABLET | Freq: Every day | ORAL | 3 refills | Status: DC

## 2020-12-07 MED ORDER — AZELASTINE HCL 0.1 % NA SOLN: 2.0000 | Freq: Two times a day (BID) | NASAL | 12 refills | Status: DC

## 2020-12-07 NOTE — Telephone Encounter (Signed)
Rx sent in

## 2020-12-07 NOTE — Assessment & Plan Note (Signed)
A1c 6.1.  Decrease Metformin to 500 g daily.  Recheck A1c 3 to 6 months.

## 2020-12-07 NOTE — Progress Notes (Signed)
   [name] is a 80 y.o. male who presents today for an office visit.  Assessment/Plan:  Chronic Problems Addressed Today: Hypertension associated with diabetes ([place]) At goal.  Continue losartan-HCTZ 100-25 once daily.  Check CMET.  Dyslipidemia associated with type 2 diabetes mellitus ([facility]) Check lipids.  Continue Lipitor 80 mg daily.  Diabetes ([facility]) A1c 6.1.  Decrease Metformin to 500 g daily.  Recheck A1c 3 to 6 months.  Guillain-Barre syndrome Stable. Avoiding vaccines.   Malignant neoplasm of prostate [facility]) Follow-up with oncology and urology soon.  Rhinorrhea Start Astelin.    Subjective:  HPI:  See A/p.         Objective:  Physical Exam: BP 127/70   Pulse 75   Temp (!) 97.3 F (36.3 C) (Temporal)   Ht 5' 8"  (1.727 m)   Wt 185 lb 12.8 oz (84.3 kg)   SpO2 97%   BMI 28.25 kg/m   Gen: No acute distress, resting comfortably CV: Regular rate and rhythm with no murmurs appreciated Pulm: Normal work of breathing, clear to auscultation bilaterally with no crackles, wheezes, or rhonchi Neuro: Grossly normal, moves all extremities Psych: Normal affect and thought content      Eamonn Sermeno M. [name], MD 12/07/2020 9:39 AM

## 2020-12-07 NOTE — Assessment & Plan Note (Signed)
Check lipids.  Continue Lipitor 80 mg daily.

## 2020-12-07 NOTE — Patient Instructions (Addendum)
It was very nice to see you today!  Please decrease your Metformin to 1 pill in the morning.  Please try the Astelin nasal spray.  We will check blood work today.   Please come back to see me in 6 months or sooner as needed.  Take care, Dr [name]  Please try these tips to maintain a healthy lifestyle:   Eat at least 3 REAL meals and 1-2 snacks per day.  Aim for no more than 5 hours between eating.  If you eat breakfast, please do so within one hour of getting up.    Each meal should contain half fruits/vegetables, one quarter protein, and one quarter carbs (no bigger than a computer mouse)   Cut down on sweet beverages. This includes juice, soda, and sweet tea.     Drink at least 1 glass of water with each meal and aim for at least 8 glasses per day   Exercise at least 150 minutes every week.

## 2020-12-07 NOTE — Assessment & Plan Note (Signed)
Follow-up with oncology and urology soon.

## 2020-12-07 NOTE — Telephone Encounter (Signed)
Patient would like azelastine (ASTELIN) 0.1 % nasal spray sent to [id number]- [place], [place] - [address] [facility]

## 2020-12-07 NOTE — Assessment & Plan Note (Signed)
Stable. Avoiding vaccines.

## 2020-12-07 NOTE — Assessment & Plan Note (Signed)
At goal.  Continue losartan-HCTZ 100-25 once daily.  Check CMET.

## 2020-12-07 NOTE — Addendum Note (Signed)
Addended by: Betti Cruz on: 12/07/2020 10:02 AM   Modules accepted: Orders

## 2020-12-07 NOTE — Assessment & Plan Note (Signed)
Start Astelin.

## 2020-12-08 NOTE — Progress Notes (Signed)
Please inform patient of the following:  Labs are all stable. WE should recheck again in 6 months.  Do not need to make any changes to his treatment plan at this time.   Algis Greenhouse. [name], MD 12/08/2020 3:57 PM

## 2020-12-09 ENCOUNTER — Other Ambulatory Visit: Payer: Self-pay | Admitting: Emergency Medicine

## 2020-12-09 DIAGNOSIS — J3489 Other specified disorders of nose and nasal sinuses: Secondary | ICD-10-CM

## 2020-12-09 MED ORDER — IPRATROPIUM BROMIDE 0.03 % NA SOLN: 2.0000 | Freq: Two times a day (BID) | NASAL | 2 refills | Status: DC

## 2020-12-14 DIAGNOSIS — L718 Other rosacea: Secondary | ICD-10-CM | POA: Diagnosis not present

## 2020-12-14 DIAGNOSIS — L570 Actinic keratosis: Secondary | ICD-10-CM | POA: Diagnosis not present

## 2021-01-24 ENCOUNTER — Telehealth (INDEPENDENT_AMBULATORY_CARE_PROVIDER_SITE_OTHER): Payer: Medicare Other | Admitting: Family Medicine

## 2021-01-24 ENCOUNTER — Encounter: Payer: Self-pay | Admitting: Family Medicine

## 2021-01-24 VITALS — Temp 97.5°F | Ht 68.0 in | Wt 180.0 lb

## 2021-01-24 DIAGNOSIS — U071 COVID-19: Secondary | ICD-10-CM

## 2021-01-24 MED ORDER — BENZONATATE 200 MG PO CAPS: 200.0000 mg | ORAL_CAPSULE | Freq: Two times a day (BID) | ORAL | 0 refills | Status: DC | PRN

## 2021-01-24 MED ORDER — AZELASTINE HCL 0.1 % NA SOLN: 2.0000 | Freq: Two times a day (BID) | NASAL | 12 refills | Status: DC

## 2021-01-24 NOTE — Progress Notes (Signed)
   [name] is a 80 y.o. male who presents today for a virtual office visit.  Assessment/Plan:  Covid 19 Thankfully symptoms are relatively mild.  We will continue conservative management.  Will start Astelin and Tessalon.  Encourage good oral hydration.  Discussed typical course of recovery.  He will let me know if he is not improving over the next 1 to 2 weeks.  Discussed reasons to return to care or seek emergent care.    Subjective:  HPI:  Symptoms started 8 days ago. Had positive test 6 days ago. Symptoms include fatigue, cough, and nasal congestion. Some chills. No fevers. No shortness of breath. No chest pain. Symptoms seem to be improving.        Objective/Observations  Physical Exam: Gen: NAD, resting comfortably Pulm: Normal work of breathing Neuro: Grossly normal, moves all extremities Psych: Normal affect and thought content  Virtual Visit via Video   I connected with [name] on 01/24/21 at 11:00 AM EDT by a video enabled telemedicine application and verified that I am speaking with the correct person using two identifiers. The limitations of evaluation and management by telemedicine and the availability of in person appointments were discussed. The patient expressed understanding and agreed to proceed.   Patient location: Home Provider location: [place] participating in the virtual visit: Myself and Patient     Algis Greenhouse. [name], MD 01/24/2021 11:28 AM

## 2021-01-27 ENCOUNTER — Telehealth: Payer: Self-pay

## 2021-01-27 ENCOUNTER — Other Ambulatory Visit: Payer: Self-pay

## 2021-01-27 MED ORDER — AZITHROMYCIN 250 MG PO TABS: ORAL_TABLET | ORAL | 0 refills | Status: DC

## 2021-01-27 MED ORDER — ALBUTEROL SULFATE HFA 108 (90 BASE) MCG/ACT IN AERS: 1.0000 | INHALATION_SPRAY | Freq: Four times a day (QID) | RESPIRATORY_TRACT | 1 refills | Status: DC | PRN

## 2021-01-27 NOTE — Telephone Encounter (Signed)
Recommend they go to ED or urgetn care if his O2 is dropping and he is having shortness of breath.   Ok to send in albuterol and zpack if they refuse but going to the ED would be my first recommendation.  Algis Greenhouse. [name], MD 01/27/2021 3:12 PM

## 2021-01-27 NOTE — Telephone Encounter (Signed)
Patients daughter called stating patient tested positive for Covid and is developing new symptoms and would like an albuterol and z pack prescription for patient  Please return call asap  [phone] - susanne

## 2021-01-27 NOTE — Telephone Encounter (Signed)
Pt.'s daughter is asking if something other than the pearls can be sent in for patient's cough. She states those are not doing anything for his cough.

## 2021-01-27 NOTE — Telephone Encounter (Signed)
Spoke with patient daughter, stated patient o2 has been as low as 88 but back to 91,He has SOB when talking. She is requesting a inhaler and something stronger for his cough, the tessalon perles is not working. Informed ok to take Vitamin C ,D and zinc. Informed to go to ED or urgent care if symptoms worsen. Please advise

## 2021-01-27 NOTE — Telephone Encounter (Signed)
Rx sent patient notified of message below

## 2021-01-27 NOTE — Telephone Encounter (Signed)
See note  Please advise

## 2021-01-28 ENCOUNTER — Inpatient Hospital Stay (HOSPITAL_COMMUNITY)
Admission: EM | Admit: 2021-01-28 | Discharge: 2021-02-02 | DRG: 177 | Disposition: A | Discharge status: Home / Self Care | Payer: Medicare Other | Attending: Internal Medicine | Admitting: Internal Medicine

## 2021-01-28 ENCOUNTER — Encounter (HOSPITAL_COMMUNITY): Payer: Self-pay | Admitting: Internal Medicine

## 2021-01-28 ENCOUNTER — Other Ambulatory Visit: Payer: Self-pay

## 2021-01-28 ENCOUNTER — Emergency Department (HOSPITAL_COMMUNITY): Payer: Medicare Other

## 2021-01-28 DIAGNOSIS — J9601 Acute respiratory failure with hypoxia: Secondary | ICD-10-CM | POA: Diagnosis present

## 2021-01-28 DIAGNOSIS — E1165 Type 2 diabetes mellitus with hyperglycemia: Secondary | ICD-10-CM | POA: Diagnosis present

## 2021-01-28 DIAGNOSIS — K7581 Nonalcoholic steatohepatitis (NASH): Secondary | ICD-10-CM | POA: Diagnosis not present

## 2021-01-28 DIAGNOSIS — J1282 Pneumonia due to coronavirus disease 2019: Secondary | ICD-10-CM | POA: Diagnosis not present

## 2021-01-28 DIAGNOSIS — J440 Chronic obstructive pulmonary disease with acute lower respiratory infection: Secondary | ICD-10-CM | POA: Diagnosis present

## 2021-01-28 DIAGNOSIS — E861 Hypovolemia: Secondary | ICD-10-CM | POA: Diagnosis present

## 2021-01-28 DIAGNOSIS — U071 COVID-19: Secondary | ICD-10-CM | POA: Diagnosis not present

## 2021-01-28 DIAGNOSIS — E1122 Type 2 diabetes mellitus with diabetic chronic kidney disease: Secondary | ICD-10-CM | POA: Diagnosis present

## 2021-01-28 DIAGNOSIS — N183 Chronic kidney disease, stage 3 unspecified: Secondary | ICD-10-CM | POA: Diagnosis not present

## 2021-01-28 DIAGNOSIS — R0602 Shortness of breath: Secondary | ICD-10-CM | POA: Diagnosis not present

## 2021-01-28 DIAGNOSIS — Z7982 Long term (current) use of aspirin: Secondary | ICD-10-CM | POA: Diagnosis not present

## 2021-01-28 DIAGNOSIS — Z888 Allergy status to other drugs, medicaments and biological substances status: Secondary | ICD-10-CM | POA: Diagnosis not present

## 2021-01-28 DIAGNOSIS — Z79899 Other long term (current) drug therapy: Secondary | ICD-10-CM | POA: Diagnosis not present

## 2021-01-28 DIAGNOSIS — Z923 Personal history of irradiation: Secondary | ICD-10-CM | POA: Diagnosis not present

## 2021-01-28 DIAGNOSIS — N179 Acute kidney failure, unspecified: Secondary | ICD-10-CM | POA: Diagnosis present

## 2021-01-28 DIAGNOSIS — Z87891 Personal history of nicotine dependence: Secondary | ICD-10-CM

## 2021-01-28 DIAGNOSIS — Z8249 Family history of ischemic heart disease and other diseases of the circulatory system: Secondary | ICD-10-CM

## 2021-01-28 DIAGNOSIS — I482 Chronic atrial fibrillation, unspecified: Secondary | ICD-10-CM | POA: Diagnosis not present

## 2021-01-28 DIAGNOSIS — I129 Hypertensive chronic kidney disease with stage 1 through stage 4 chronic kidney disease, or unspecified chronic kidney disease: Secondary | ICD-10-CM | POA: Diagnosis not present

## 2021-01-28 DIAGNOSIS — N400 Enlarged prostate without lower urinary tract symptoms: Secondary | ICD-10-CM | POA: Diagnosis present

## 2021-01-28 DIAGNOSIS — Z7984 Long term (current) use of oral hypoglycemic drugs: Secondary | ICD-10-CM | POA: Diagnosis not present

## 2021-01-28 DIAGNOSIS — E785 Hyperlipidemia, unspecified: Secondary | ICD-10-CM | POA: Diagnosis present

## 2021-01-28 DIAGNOSIS — I959 Hypotension, unspecified: Secondary | ICD-10-CM | POA: Diagnosis present

## 2021-01-28 DIAGNOSIS — Z8546 Personal history of malignant neoplasm of prostate: Secondary | ICD-10-CM | POA: Diagnosis not present

## 2021-01-28 DIAGNOSIS — R0902 Hypoxemia: Secondary | ICD-10-CM | POA: Diagnosis not present

## 2021-01-28 DIAGNOSIS — R059 Cough, unspecified: Secondary | ICD-10-CM | POA: Diagnosis not present

## 2021-01-28 LAB — TROPONIN I (HIGH SENSITIVITY)
Troponin I (High Sensitivity): 16 ng/L (ref <18)
Troponin I (High Sensitivity): 15 ng/L (ref <18)

## 2021-01-28 LAB — COMPREHENSIVE METABOLIC PANEL
ALT: 31 U/L (ref 0–44)
AST: 57 U/L — ABNORMAL HIGH (ref 15–41)
Albumin: 2.6 g/dL — ABNORMAL LOW (ref 3.5–5.0)
Alkaline Phosphatase: 82 U/L (ref 38–126)
Anion gap: 12 (ref 5–15)
BUN: 36 mg/dL — ABNORMAL HIGH (ref 8–23)
CO2: 22 mmol/L (ref 22–32)
Calcium: 8.7 mg/dL — ABNORMAL LOW (ref 8.9–10.3)
Chloride: 100 mmol/L (ref 98–111)
Creatinine, Ser: 1.88 mg/dL — ABNORMAL HIGH (ref 0.61–1.24)
GFR, Estimated: 36 mL/min — ABNORMAL LOW (ref >60)
Glucose, Bld: 249 mg/dL — ABNORMAL HIGH (ref 70–99)
Potassium: 3.8 mmol/L (ref 3.5–5.1)
Sodium: 134 mmol/L — ABNORMAL LOW (ref 135–145)
Total Bilirubin: 1 mg/dL (ref 0.3–1.2)
Total Protein: 6.3 g/dL — ABNORMAL LOW (ref 6.5–8.1)

## 2021-01-28 LAB — I-STAT VENOUS BLOOD GAS, ED
Acid-Base Excess: 2 mmol/L (ref 0.0–2.0)
Bicarbonate: 23.8 mmol/L (ref 20.0–28.0)
Calcium, Ion: 1.01 mmol/L — ABNORMAL LOW (ref 1.15–1.40)
HCT: 46 % (ref 39.0–52.0)
Hemoglobin: 15.6 g/dL (ref 13.0–17.0)
O2 Saturation: 98 %
Potassium: 3.9 mmol/L (ref 3.5–5.1)
Sodium: 135 mmol/L (ref 135–145)
TCO2: 25 mmol/L (ref 22–32)
pCO2, Ven: 30.3 mmHg — ABNORMAL LOW (ref 44.0–60.0)
pH, Ven: 7.503 — ABNORMAL HIGH (ref 7.250–7.430)
pO2, Ven: 99 mmHg — ABNORMAL HIGH (ref 32.0–45.0)

## 2021-01-28 LAB — BLOOD GAS, VENOUS
Acid-base deficit: 1.3 mmol/L (ref 0.0–2.0)
Bicarbonate: 22.2 mmol/L (ref 20.0–28.0)
O2 Saturation: 84.7 %
Patient temperature: 37
pCO2, Ven: 32.8 mmHg — ABNORMAL LOW (ref 44.0–60.0)
pH, Ven: 7.446 — ABNORMAL HIGH (ref 7.250–7.430)

## 2021-01-28 LAB — CBC WITH DIFFERENTIAL/PLATELET
Abs Immature Granulocytes: 0.07 10*3/uL (ref 0.00–0.07)
Basophils Absolute: 0 10*3/uL (ref 0.0–0.1)
Basophils Relative: 0 %
Eosinophils Absolute: 0 10*3/uL (ref 0.0–0.5)
Eosinophils Relative: 0 %
HCT: 46 % (ref 39.0–52.0)
Hemoglobin: 15.6 g/dL (ref 13.0–17.0)
Immature Granulocytes: 1 %
Lymphocytes Relative: 4 %
Lymphs Abs: 0.3 10*3/uL — ABNORMAL LOW (ref 0.7–4.0)
MCH: 30.7 pg (ref 26.0–34.0)
MCHC: 33.9 g/dL (ref 30.0–36.0)
MCV: 90.6 fL (ref 80.0–100.0)
Monocytes Absolute: 0.7 10*3/uL (ref 0.1–1.0)
Monocytes Relative: 9 %
Neutro Abs: 6.4 10*3/uL (ref 1.7–7.7)
Neutrophils Relative %: 86 %
Platelets: 187 10*3/uL (ref 150–400)
RBC: 5.08 MIL/uL (ref 4.22–5.81)
RDW: 13.7 % (ref 11.5–15.5)
WBC: 7.4 10*3/uL (ref 4.0–10.5)
nRBC: 0 % (ref 0.0–0.2)

## 2021-01-28 LAB — FERRITIN: Ferritin: 459 ng/mL — ABNORMAL HIGH (ref 24–336)

## 2021-01-28 LAB — GLUCOSE, CAPILLARY: Glucose-Capillary: 275 mg/dL — ABNORMAL HIGH (ref 70–99)

## 2021-01-28 LAB — D-DIMER, QUANTITATIVE: D-Dimer, Quant: 1.93 ug/mL-FEU — ABNORMAL HIGH (ref 0.00–0.50)

## 2021-01-28 LAB — LACTIC ACID, PLASMA
Lactic Acid, Venous: 2.2 mmol/L (ref 0.5–1.9)
Lactic Acid, Venous: 2.3 mmol/L (ref 0.5–1.9)

## 2021-01-28 LAB — LACTATE DEHYDROGENASE: LDH: 401 U/L — ABNORMAL HIGH (ref 98–192)

## 2021-01-28 LAB — FIBRINOGEN: Fibrinogen: 624 mg/dL — ABNORMAL HIGH (ref 210–475)

## 2021-01-28 LAB — SARS CORONAVIRUS 2 (TAT 6-24 HRS): SARS Coronavirus 2: POSITIVE — AB

## 2021-01-28 LAB — CBG MONITORING, ED: Glucose-Capillary: 189 mg/dL — ABNORMAL HIGH (ref 70–99)

## 2021-01-28 LAB — PROCALCITONIN: Procalcitonin: <0.1 ng/mL

## 2021-01-28 LAB — HEPATITIS B SURFACE ANTIGEN: Hepatitis B Surface Ag: NONREACTIVE

## 2021-01-28 MED ORDER — PREDNISONE 20 MG PO TABS: 50.0000 mg | ORAL_TABLET | Freq: Every day | ORAL | Status: DC

## 2021-01-28 MED ORDER — ONDANSETRON HCL 4 MG PO TABS: 4.0000 mg | ORAL_TABLET | Freq: Four times a day (QID) | ORAL | Status: DC | PRN

## 2021-01-28 MED ORDER — TAMSULOSIN HCL 0.4 MG PO CAPS
0.4000 mg | ORAL_CAPSULE | Freq: Every day | ORAL | Status: DC
Start: 2021-01-28 — End: 2021-02-02
  Administered 2021-01-28 – 2021-02-01 (×5): 0.4 mg via ORAL
  Filled 2021-01-28 (×5): qty 1

## 2021-01-28 MED ORDER — INSULIN ASPART 100 UNIT/ML ~~LOC~~ SOLN
0.0000 [IU] | Freq: Every day | SUBCUTANEOUS | Status: DC
  Administered 2021-01-28: 3 [IU] via SUBCUTANEOUS
  Administered 2021-01-29 – 2021-02-01 (×3): 2 [IU] via SUBCUTANEOUS

## 2021-01-28 MED ORDER — METHYLPREDNISOLONE SODIUM SUCC 125 MG IJ SOLR
125.0000 mg | Freq: Once | INTRAMUSCULAR | Status: AC
  Administered 2021-01-28: 125 mg via INTRAVENOUS
  Filled 2021-01-28: qty 2

## 2021-01-28 MED ORDER — DIAZEPAM 2 MG PO TABS: 10.0000 mg | ORAL_TABLET | Freq: Two times a day (BID) | ORAL | Status: DC | PRN

## 2021-01-28 MED ORDER — BENZONATATE 100 MG PO CAPS
200.0000 mg | ORAL_CAPSULE | Freq: Two times a day (BID) | ORAL | Status: DC | PRN
  Administered 2021-01-29: 200 mg via ORAL
  Filled 2021-01-28: qty 2

## 2021-01-28 MED ORDER — IOHEXOL 350 MG/ML SOLN
60.0000 mL | Freq: Once | INTRAVENOUS | Status: AC | PRN
  Administered 2021-01-28: 60 mL via INTRAVENOUS

## 2021-01-28 MED ORDER — SODIUM CHLORIDE 0.9 % IV SOLN: INTRAVENOUS | Status: DC

## 2021-01-28 MED ORDER — INSULIN ASPART 100 UNIT/ML ~~LOC~~ SOLN
0.0000 [IU] | Freq: Three times a day (TID) | SUBCUTANEOUS | Status: DC
  Administered 2021-01-29 (×3): 2 [IU] via SUBCUTANEOUS
  Administered 2021-01-30: 3 [IU] via SUBCUTANEOUS
  Administered 2021-01-30 (×2): 2 [IU] via SUBCUTANEOUS
  Administered 2021-01-31: 5 [IU] via SUBCUTANEOUS
  Administered 2021-01-31 – 2021-02-01 (×3): 2 [IU] via SUBCUTANEOUS
  Administered 2021-02-01: 3 [IU] via SUBCUTANEOUS
  Administered 2021-02-01 – 2021-02-02 (×3): 2 [IU] via SUBCUTANEOUS

## 2021-01-28 MED ORDER — ATORVASTATIN CALCIUM 80 MG PO TABS
80.0000 mg | ORAL_TABLET | Freq: Every day | ORAL | Status: DC
  Administered 2021-01-29 – 2021-02-02 (×5): 80 mg via ORAL
  Filled 2021-01-28 (×5): qty 1

## 2021-01-28 MED ORDER — FLUTICASONE PROPIONATE 50 MCG/ACT NA SUSP
2.0000 | Freq: Every day | NASAL | Status: DC | PRN
  Filled 2021-01-28: qty 16

## 2021-01-28 MED ORDER — PREDNISONE 5 MG PO TABS: 50.0000 mg | ORAL_TABLET | Freq: Every day | ORAL | Status: DC

## 2021-01-28 MED ORDER — HYDRALAZINE HCL 25 MG PO TABS: 25.0000 mg | ORAL_TABLET | Freq: Four times a day (QID) | ORAL | Status: DC | PRN

## 2021-01-28 MED ORDER — METHYLPREDNISOLONE SODIUM SUCC 125 MG IJ SOLR
1.0000 mg/kg | Freq: Two times a day (BID) | INTRAMUSCULAR | Status: DC
  Administered 2021-01-29: 81.875 mg via INTRAVENOUS
  Filled 2021-01-28: qty 2

## 2021-01-28 MED ORDER — IPRATROPIUM-ALBUTEROL 20-100 MCG/ACT IN AERS
1.0000 | INHALATION_SPRAY | Freq: Four times a day (QID) | RESPIRATORY_TRACT | Status: DC
  Administered 2021-01-29: 1 via RESPIRATORY_TRACT
  Filled 2021-01-28 (×3): qty 4

## 2021-01-28 MED ORDER — ACETAMINOPHEN 325 MG PO TABS: 650.0000 mg | ORAL_TABLET | Freq: Four times a day (QID) | ORAL | Status: DC | PRN

## 2021-01-28 MED ORDER — GUAIFENESIN-DM 100-10 MG/5ML PO SYRP
10.0000 mL | ORAL_SOLUTION | ORAL | Status: DC | PRN
  Filled 2021-01-28: qty 10

## 2021-01-28 MED ORDER — ONDANSETRON HCL 4 MG/2ML IJ SOLN: 4.0000 mg | Freq: Four times a day (QID) | INTRAMUSCULAR | Status: DC | PRN

## 2021-01-28 MED ORDER — APIXABAN 5 MG PO TABS
5.0000 mg | ORAL_TABLET | Freq: Two times a day (BID) | ORAL | Status: DC
  Administered 2021-01-28 – 2021-02-02 (×10): 5 mg via ORAL
  Filled 2021-01-28 (×10): qty 1

## 2021-01-28 MED ORDER — HEPARIN SODIUM (PORCINE) 5000 UNIT/ML IJ SOLN: 5000.0000 [IU] | Freq: Two times a day (BID) | INTRAMUSCULAR | Status: DC

## 2021-01-28 MED ORDER — METHYLPREDNISOLONE SODIUM SUCC 125 MG IJ SOLR
1.0000 mg/kg | Freq: Two times a day (BID) | INTRAMUSCULAR | Status: DC
  Filled 2021-01-28: qty 2

## 2021-01-28 NOTE — ED Notes (Signed)
Pt did not go to c-t

## 2021-01-28 NOTE — ED Notes (Signed)
Pt hard of hearing no complaints

## 2021-01-28 NOTE — H&P (Addendum)
History and Physical    [name] LKG:[id number] DOB: [birth year] DOA: 01/28/2021  PCP: Vivi Barrack, MD (Confirm with patient/family/NH records and if not entered, this has to be entered at [facility] point of entry) Patient coming from: Home  I have personally briefly reviewed patient's old medical records in [place]  Chief Complaint: SOB  HPI: [name] is a 80 y.o. male with medical history significant of CKD stage III, HTN, IIDM, HLD, Guillain-Barr syndrome, prostate cancer status post radiation, presented with increasing shortness of breath and cough.    Patient on vaccinated for COVID after consulting with his PCP who advised him not to be vaccinated due to history of Guillain-Barr syndrome.  About 10 days ago, patient went to home party playing pokers with his neighbors and came back and started to have a cough.  Went to tested 7 days ago and positive for COVID.  Subsequently, developed GI symptoms of loss of taste and poor appetite and loose bowel movement which improved over the last 2 days.  PCP ordered Zithromax and albuterol inhalers did not help.  He also experienced bouts of chills but no fever.  Niece has been going to his home every day to check pulse ox which has been stable in mid 90%s, until yesterday when it dropped to 70% when walking around the house.  ER course, O2 sat 88% on RA. D-dimer positive, CTA negative for PE.  Telemetry monitor showed A. fib.  Review of Systems: As per HPI otherwise 14 point review of systems negative.    Past Medical History:  Diagnosis Date  . CKD (chronic kidney disease), stage III ([place])   . Dyslipidemia   . ED (erectile dysfunction)   . History of Guillain-Barre syndrome    per pt 1990s fron Flu Vaccine,  stated resolved with no residual  . History of nuclear stress test    08-23-2004  by dr Ron Parker--- normal without ischemia,  ef 68%  . Hyperplasia of prostate with lower urinary tract symptoms (LUTS)   . Hypertension   .  NASH (nonalcoholic steatohepatitis) 2010   followed by pcp  . Prostate cancer [facility]) urologist-- dr winter;  oncologist-- dr Tammi Klippel   dx 02-26-2019,  Stage T1c,  Gleason 3+4, vol 80.1cc;  plan external radiation  . Rhinorrhea   . Type 2 diabetes mellitus ([place])     Past Surgical History:  Procedure Laterality Date  . CARPAL TUNNEL RELEASE Right 1990s  . GOLD SEED IMPLANT N/A 05/20/2019   Procedure: GOLD SEED IMPLANT;  Surgeon: Ceasar Mons, MD;  Location: [facility];  Service: Urology;  Laterality: N/A;  . HERNIA REPAIR  infant  . prostate biopsy  02-26-2019  dr winter office  . SPACE OAR INSTILLATION N/A 05/20/2019   Procedure: SPACE OAR INSTILLATION;  Surgeon: Ceasar Mons, MD;  Location: [facility];  Service: Urology;  Laterality: N/A;  . TONSILLECTOMY  child     reports that he quit smoking about 39 years ago. His smoking use included cigarettes. He has a 15.00 pack-year smoking history. He has never used smokeless tobacco. He reports that he does not drink alcohol and does not use drugs.  Allergies  Allergen Reactions  . Lovastatin Other (See Comments)    REACTION: Foot pain  . Simvastatin Other (See Comments)    REACTION: Myalgias    Family History  Problem Relation Age of Onset  . Congestive Heart Failure Mother   . Cirrhosis Father   .  Cancer Neg Hx      Prior to Admission medications   Medication Sig Start Date End Date Taking? Authorizing Provider  albuterol (VENTOLIN HFA) 108 (90 Base) MCG/ACT inhaler Inhale 1-2 puffs into the lungs every 6 (six) hours as needed for wheezing or shortness of breath. 01/27/21   Vivi Barrack, MD  aspirin 81 MG tablet Take 81 mg by mouth daily. Patient not taking: Reported on 01/24/2021    [provider]  Aspirin-Calcium Carbonate [phone] MG TABS Take by mouth.     [provider]  atorvastatin (LIPITOR) 80 MG tablet TAKE 1 TABLET BY MOUTH  DAILY 09/01/20    Vivi Barrack, MD  azelastine (ASTELIN) 0.1 % nasal spray Place 2 sprays into both nostrils 2 (two) times daily. 01/24/21   Vivi Barrack, MD  azithromycin (ZITHROMAX Z-PAK) 250 MG tablet See Directions 01/27/21   Vivi Barrack, MD  benzonatate (TESSALON) 200 MG capsule Take 1 capsule (200 mg total) by mouth 2 (two) times daily as needed for cough. 01/24/21   Vivi Barrack, MD  diazepam (VALIUM) 10 MG tablet Take 10 mg by mouth daily as needed. 08/23/20   [provider]  fluticasone (CUTIVATE) 0.05 % cream APPLY A SMALL AMOUNT TO AFFECTED AREA TWICE A DAY AS NEEDED 11/24/19   [provider]  fluticasone (FLONASE) 50 MCG/ACT nasal spray Place 2 sprays into both nostrils daily. 2 sprays in each nostrils as needed. 06/06/17   Renato Shin, MD  losartan-hydrochlorothiazide [facility]) 100-25 MG tablet TAKE 1 TABLET BY MOUTH  DAILY 07/29/20   Vivi Barrack, MD  metFORMIN (GLUCOPHAGE-XR) 500 MG 24 hr tablet Take 1 tablet (500 mg total) by mouth daily. 12/07/20   Vivi Barrack, MD  tamsulosin (FLOMAX) 0.4 MG CAPS capsule Take 0.4 mg by mouth at bedtime.     [provider]  valACYclovir (VALTREX) 1000 MG tablet  11/24/19   [provider]    Physical Exam: Vitals:   01/28/21 1400 01/28/21 1414 01/28/21 1500 01/28/21 1600  BP: (!) 84/55 96/63 (!) 88/54 94/63  Pulse: 66 66 (!) 52 (!) 53  Resp: 20 (!) 24 17 18   Temp:      TempSrc:      SpO2: 93% 91% 95% 95%    Constitutional: NAD, calm, comfortable Vitals:   01/28/21 1400 01/28/21 1414 01/28/21 1500 01/28/21 1600  BP: (!) 84/55 96/63 (!) 88/54 94/63  Pulse: 66 66 (!) 52 (!) 53  Resp: 20 (!) 24 17 18   Temp:      TempSrc:      SpO2: 93% 91% 95% 95%   Eyes: PERRL, lids and conjunctivae normal ENMT: Mucous membranes are dry. Posterior pharynx clear of any exudate or lesions.Normal dentition.  Neck: normal, supple, no masses, no thyromegaly Respiratory: clear to auscultation bilaterally, no wheezing, no  crackles. Increasing respiratory effort. No accessory muscle use.  Cardiovascular: Irregular heart rate, no murmurs / rubs / gallops. No extremity edema. 2+ pedal pulses. No carotid bruits.  Abdomen: no tenderness, no masses palpated. No hepatosplenomegaly. Bowel sounds positive.  Musculoskeletal: no clubbing / cyanosis. No joint deformity upper and lower extremities. Good ROM, no contractures. Normal muscle tone.  Skin: no rashes, lesions, ulcers. No induration Neurologic: CN 2-12 grossly intact. Sensation intact, DTR normal. Strength 5/5 in all 4.  Psychiatric: Normal judgment and insight. Alert and oriented x 3. Normal mood.     Labs on Admission: I have personally reviewed following labs and imaging  studies  CBC: Recent Labs  Lab 01/28/21 1242 01/28/21 1252  WBC 7.4  --   NEUTROABS 6.4  --   HGB 15.6 15.6  HCT 46.0 46.0  MCV 90.6  --   PLT 187  --    Basic Metabolic Panel: Recent Labs  Lab 01/28/21 1242 01/28/21 1252  NA 134* 135  K 3.8 3.9  CL 100  --   CO2 22  --   GLUCOSE 249*  --   BUN 36*  --   CREATININE 1.88*  --   CALCIUM 8.7*  --    GFR: Estimated Creatinine Clearance: 31.3 mL/min (A) (by C-G formula based on SCr of 1.88 mg/dL (H)). Liver Function Tests: Recent Labs  Lab 01/28/21 1242  AST 57*  ALT 31  ALKPHOS 82  BILITOT 1.0  PROT 6.3*  ALBUMIN 2.6*   No results for input(s): LIPASE, AMYLASE in the last 168 hours. No results for input(s): AMMONIA in the last 168 hours. Coagulation Profile: No results for input(s): INR, PROTIME in the last 168 hours. Cardiac Enzymes: No results for input(s): CKTOTAL, CKMB, CKMBINDEX, TROPONINI in the last 168 hours. BNP (last 3 results) No results for input(s): PROBNP in the last 8760 hours. HbA1C: No results for input(s): HGBA1C in the last 72 hours. CBG: No results for input(s): GLUCAP in the last 168 hours. Lipid Profile: No results for input(s): CHOL, HDL, LDLCALC, TRIG, CHOLHDL, LDLDIRECT in the last  72 hours. Thyroid Function Tests: No results for input(s): TSH, T4TOTAL, FREET4, T3FREE, THYROIDAB in the last 72 hours. Anemia Panel: No results for input(s): VITAMINB12, FOLATE, FERRITIN, TIBC, IRON, RETICCTPCT in the last 72 hours. Urine analysis:    Component Value Date/Time   COLORURINE YELLOW 05/24/2017 [place] 05/24/2017 0904   LABSPEC 1.020 05/24/2017 0904   PHURINE 5.5 05/24/2017 0904   GLUCOSEU NEGATIVE 05/24/2017 0904   HGBUR NEGATIVE 05/24/2017 0904   BILIRUBINUR NEGATIVE 05/24/2017 0904   KETONESUR NEGATIVE 05/24/2017 0904   PROTEINUR NEG 12/27/2014 0942   UROBILINOGEN 0.2 05/24/2017 0904   NITRITE NEGATIVE 05/24/2017 0904   LEUKOCYTESUR NEGATIVE 05/24/2017 0904    Radiological Exams on Admission: CT Angio Chest PE W/Cm &/Or Wo Cm  Result Date: 01/28/2021 CLINICAL DATA:  Elevated D-dimer, COVID-19 positive, increased coughing since Monday. History of NASH, stage III chronic kidney disease, Guillain-Barre syndrome, hypertension, prostate cancer, type II diabetes mellitus EXAM: CT ANGIOGRAPHY CHEST WITH CONTRAST TECHNIQUE: Multidetector CT imaging of the chest was performed using the standard protocol during bolus administration of intravenous contrast. Multiplanar CT image reconstructions and MIPs were obtained to evaluate the vascular anatomy. CONTRAST:  64m OMNIPAQUE IOHEXOL 350 MG/ML SOLN IV COMPARISON:  None FINDINGS: Cardiovascular: Atherosclerotic calcifications aorta, proximal great vessels, and minimally in coronary arteries. Aorta normal caliber without aneurysm. Heart unremarkable. No pericardial effusion. Pulmonary arteries adequately opacified and patent. No evidence of pulmonary embolism. Mediastinum/Nodes: Esophagus unremarkable. Base of cervical region normal appearance. No thoracic adenopathy. Lungs/Pleura: Patchy BILATERAL pulmonary infiltrates consistent with multifocal pneumonia and COVID-19. No pleural effusion or pneumothorax. Upper  Abdomen: Visualized liver and spleen unremarkable Musculoskeletal: Unremarkable Review of the MIP images confirms the above findings. IMPRESSION: No evidence of pulmonary embolism. Patchy BILATERAL pulmonary infiltrates consistent with multifocal pneumonia and COVID-19. Aortic Atherosclerosis (ICD10-I70.0). Electronically Signed   By: MLavonia DanaM.D.   On: 01/28/2021 17:01   Portable chest 1 View  Result Date: 01/28/2021 CLINICAL DATA:  Shortness of breath.  COVID positive. EXAM: PORTABLE CHEST 1 VIEW  COMPARISON:  None. FINDINGS: Normal heart size. No pleural effusion. Right upper lobe, left mid and left lower lobe interstitial and airspace densities are identified compatible with history of COVID. No pleural effusion. The visualized osseous structures are unremarkable. IMPRESSION: Bilateral pulmonary opacities compatible with history of COVID-19 infection. Electronically Signed   By: Kerby Moors M.D.   On: 01/28/2021 12:39    EKG: Ordered. Tele monitor shows a-fib.  Assessment/Plan Active Problems:   COVID-19 virus infection   COVID-19  (please populate well all problems here in Problem List. (For example, if patient is on BP meds at home and you resume or decide to hold them, it is a problem that needs to be her. Same for CAD, COPD, HLD and so on)  Acute hypoxic respite failure -Secondary to COVID-19 pneumonia. -Long discussion with patient and his family at bedside regarding use of antiviral medications, patient quoted his PCP's recommendation that given history of severe Guillain-Barr syndrome, not recommending patient receive anti-viral treatment. So for now, will treat with mainly with steroid regimen. -Breathing treatment, expect 1-2 days hospital stay, may discharge with home O2.  Patient and family agreed.  Also educated proning positioning when tolerated.  Chronic a-fib, rate controlled -Both his EKG in 2018 and 2020 showed a-fib but neither patient nor family aware, and resumes  A. fib on today's telemetry monitoring, EKG pending. -CHADS2=3, significant increased risk of thromboembolic event, discussed with patient and his niece at bedside, agreed with anticoagulation therapy.  Consult pharmacy for Eliquis.  IIDM with hyperglycemia -Hold Metformin for 72 hours given patient received IV contrast today. -Sliding scale  Borderline hypotension -Signs of hypovolemia, start IV fluids, hold BP meds.  CKD stage III -Hypovolemia, start IV fluid.  HLD -Mild elevated of AST, continue Statin.  DVT prophylaxis: Eliquis  code Status: Full Code Family Communication: Niece at bedside Disposition Plan: Expect 1-2 days hospital stay, may discharge home once oxygen levels stabilized. Consults called: None Admission status: Tele admit   Lequita Halt MD [facility] Pager [phone]  01/28/2021, 5:12 PM

## 2021-01-28 NOTE — ED Notes (Signed)
To ct

## 2021-01-28 NOTE — ED Provider Notes (Signed)
[place] EMERGENCY DEPARTMENT Provider Note   CSN: [id number] Arrival date & time: 01/28/21  1139     History Chief Complaint  Patient presents with  . Covid Positive  . Weakness  . Cough    [name] is a 80 y.o. male.  HPI Reports being ill for 7 days with fever, cough, weakness and shortness of breath.  Shortness of breath gradually worse.  He has been monitoring his oxygen saturation at home, when he ambulates it has been as low as the "70s."  This morning his daughter was with him and checked it because he complained of shortness of breath and it was 88%.  Patient has not had COVID vaccines or antiviral medication for Covid infection, because he has a history of Guillain-Barr.  He does not have chronic respiratory or cardiac disorders.  He has been able to eat but he does not have much of an appetite.  He denies dizziness or paresthesia.  There are no other known active modifying factors.    Past Medical History:  Diagnosis Date  . CKD (chronic kidney disease), stage III ([place])   . Dyslipidemia   . ED (erectile dysfunction)   . History of Guillain-Barre syndrome    per pt 1990s fron Flu Vaccine,  stated resolved with no residual  . History of nuclear stress test    08-23-2004  by dr Ron Parker--- normal without ischemia,  ef 68%  . Hyperplasia of prostate with lower urinary tract symptoms (LUTS)   . Hypertension   . NASH (nonalcoholic steatohepatitis) 2010   followed by pcp  . Prostate cancer [facility]) urologist-- dr winter;  oncologist-- dr Tammi Klippel   dx 02-26-2019,  Stage T1c,  Gleason 3+4, vol 80.1cc;  plan external radiation  . Rhinorrhea   . Type 2 diabetes mellitus [facility])     Patient Active Problem List   Diagnosis Date Noted  . COVID-19 virus infection 01/28/2021  . COVID-19 01/28/2021  . Pain due to onychomycosis of toenails of both feet 05/01/2019  . Malignant neoplasm of prostate ([place]) 03/26/2019  . Rhinorrhea 10/06/2018  . Actinic keratosis  10/06/2018  . Guillain-Barre syndrome ([place]) 01/06/2013  . COUGH DUE TO ACE INHIBITORS 01/10/2011  . NASH (nonalcoholic steatohepatitis) 01/12/2009  . Diabetes ([place]) 07/09/2007  . Dyslipidemia associated with type 2 diabetes mellitus ([place]) 07/09/2007  . Hypertension associated with diabetes ([place]) 07/09/2007    Past Surgical History:  Procedure Laterality Date  . CARPAL TUNNEL RELEASE Right 1990s  . GOLD SEED IMPLANT N/A 05/20/2019   Procedure: GOLD SEED IMPLANT;  Surgeon: Ceasar Mons, MD;  Location: [facility];  Service: Urology;  Laterality: N/A;  . HERNIA REPAIR  infant  . prostate biopsy  02-26-2019  dr winter office  . SPACE OAR INSTILLATION N/A 05/20/2019   Procedure: SPACE OAR INSTILLATION;  Surgeon: Ceasar Mons, MD;  Location: [facility];  Service: Urology;  Laterality: N/A;  . TONSILLECTOMY  child       Family History  Problem Relation Age of Onset  . Congestive Heart Failure Mother   . Cirrhosis Father   . Cancer Neg Hx     Social History   Tobacco Use  . Smoking status: Former Smoker    Packs/day: 1.00    Years: 15.00    Pack years: 15.00    Types: Cigarettes    Quit date: 11/05/1981    Years since quitting: 39.2  . Smokeless tobacco: Never Used  Vaping Use  .  Vaping Use: Never used  Substance Use Topics  . Alcohol use: No    Alcohol/week: 0.0 standard drinks  . Drug use: Never    Home Medications Prior to Admission medications   Medication Sig Start Date End Date Taking? Authorizing Provider  albuterol (VENTOLIN HFA) 108 (90 Base) MCG/ACT inhaler Inhale 1-2 puffs into the lungs every 6 (six) hours as needed for wheezing or shortness of breath. 01/27/21   Vivi Barrack, MD  aspirin 81 MG tablet Take 81 mg by mouth daily. Patient not taking: Reported on 01/24/2021    [provider]  Aspirin-Calcium Carbonate [phone] MG TABS Take by mouth.     [provider]  atorvastatin  (LIPITOR) 80 MG tablet TAKE 1 TABLET BY MOUTH  DAILY 09/01/20   Vivi Barrack, MD  azelastine (ASTELIN) 0.1 % nasal spray Place 2 sprays into both nostrils 2 (two) times daily. 01/24/21   Vivi Barrack, MD  azithromycin (ZITHROMAX Z-PAK) 250 MG tablet See Directions 01/27/21   Vivi Barrack, MD  benzonatate (TESSALON) 200 MG capsule Take 1 capsule (200 mg total) by mouth 2 (two) times daily as needed for cough. 01/24/21   Vivi Barrack, MD  diazepam (VALIUM) 10 MG tablet Take 10 mg by mouth daily as needed. 08/23/20   [provider]  fluticasone (CUTIVATE) 0.05 % cream APPLY A SMALL AMOUNT TO AFFECTED AREA TWICE A DAY AS NEEDED 11/24/19   [provider]  fluticasone (FLONASE) 50 MCG/ACT nasal spray Place 2 sprays into both nostrils daily. 2 sprays in each nostrils as needed. 06/06/17   Renato Shin, MD  losartan-hydrochlorothiazide [facility]) 100-25 MG tablet TAKE 1 TABLET BY MOUTH  DAILY 07/29/20   Vivi Barrack, MD  metFORMIN (GLUCOPHAGE-XR) 500 MG 24 hr tablet Take 1 tablet (500 mg total) by mouth daily. 12/07/20   Vivi Barrack, MD  tamsulosin (FLOMAX) 0.4 MG CAPS capsule Take 0.4 mg by mouth at bedtime.     [provider]  valACYclovir (VALTREX) 1000 MG tablet  11/24/19   [provider]    Allergies    Lovastatin and Simvastatin  Review of Systems   Review of Systems  All other systems reviewed and are negative.   Physical Exam Updated Vital Signs BP (!) 104/41   Pulse 60   Temp 97.8 F (36.6 C)   Resp 20   SpO2 94%   Physical Exam Vitals and nursing note reviewed.  Constitutional:      Appearance: He is well-developed.  HENT:     Head: Normocephalic and atraumatic.     Right Ear: External ear normal.     Left Ear: External ear normal.     Mouth/Throat:     Mouth: Mucous membranes are moist.     Pharynx: No oropharyngeal exudate or posterior oropharyngeal erythema.  Eyes:     Conjunctiva/sclera: Conjunctivae normal.     Pupils:  Pupils are equal, round, and reactive to light.  Neck:     Trachea: Phonation normal.  Cardiovascular:     Rate and Rhythm: Normal rate.  Pulmonary:     Effort: Pulmonary effort is normal.  Abdominal:     General: There is no distension.     Palpations: Abdomen is soft.     Tenderness: There is no abdominal tenderness.  Musculoskeletal:        General: Normal range of motion.     Cervical back: Normal range of motion and neck supple.  Skin:  General: Skin is warm and dry.  Neurological:     Mental Status: He is alert and oriented to person, place, and time.     Cranial Nerves: No cranial nerve deficit.     Sensory: No sensory deficit.     Motor: No abnormal muscle tone.     Coordination: Coordination normal.  Psychiatric:        Mood and Affect: Mood normal.        Behavior: Behavior normal.        Thought Content: Thought content normal.        Judgment: Judgment normal.     ED Results / Procedures / Treatments   Labs (all labs ordered are listed, but only abnormal results are displayed) Labs Reviewed  COMPREHENSIVE METABOLIC PANEL - Abnormal; Notable for the following components:      Result Value   Sodium 134 (*)    Glucose, Bld 249 (*)    BUN 36 (*)    Creatinine, Ser 1.88 (*)    Calcium 8.7 (*)    Total Protein 6.3 (*)    Albumin 2.6 (*)    AST 57 (*)    GFR, Estimated 36 (*)    All other components within normal limits  CBC WITH DIFFERENTIAL/PLATELET - Abnormal; Notable for the following components:   Lymphs Abs 0.3 (*)    All other components within normal limits  LACTIC ACID, PLASMA - Abnormal; Notable for the following components:   Lactic Acid, Venous 2.3 (*)    All other components within normal limits  LACTIC ACID, PLASMA - Abnormal; Notable for the following components:   Lactic Acid, Venous 2.2 (*)    All other components within normal limits  D-DIMER, QUANTITATIVE - Abnormal; Notable for the following components:   D-Dimer, Quant 1.93 (*)     All other components within normal limits  I-STAT VENOUS BLOOD GAS, ED - Abnormal; Notable for the following components:   pH, Ven 7.503 (*)    pCO2, Ven 30.3 (*)    pO2, Ven 99.0 (*)    Calcium, Ion 1.01 (*)    All other components within normal limits  CBG MONITORING, ED - Abnormal; Notable for the following components:   Glucose-Capillary 189 (*)    All other components within normal limits  SARS CORONAVIRUS 2 (TAT 6-24 HRS)  PROCALCITONIN  BLOOD GAS, VENOUS  FERRITIN  HEPATITIS B SURFACE ANTIGEN  LACTATE DEHYDROGENASE  CBC WITH DIFFERENTIAL/PLATELET  COMPREHENSIVE METABOLIC PANEL  C-REACTIVE PROTEIN  D-DIMER, QUANTITATIVE  FERRITIN  MAGNESIUM  PHOSPHORUS  FIBRINOGEN  TROPONIN I (HIGH SENSITIVITY)  TROPONIN I (HIGH SENSITIVITY)    EKG None  Radiology CT Angio Chest PE W/Cm &/Or Wo Cm  Result Date: 01/28/2021 CLINICAL DATA:  Elevated D-dimer, COVID-19 positive, increased coughing since Monday. History of NASH, stage III chronic kidney disease, Guillain-Barre syndrome, hypertension, prostate cancer, type II diabetes mellitus EXAM: CT ANGIOGRAPHY CHEST WITH CONTRAST TECHNIQUE: Multidetector CT imaging of the chest was performed using the standard protocol during bolus administration of intravenous contrast. Multiplanar CT image reconstructions and MIPs were obtained to evaluate the vascular anatomy. CONTRAST:  52m OMNIPAQUE IOHEXOL 350 MG/ML SOLN IV COMPARISON:  None FINDINGS: Cardiovascular: Atherosclerotic calcifications aorta, proximal great vessels, and minimally in coronary arteries. Aorta normal caliber without aneurysm. Heart unremarkable. No pericardial effusion. Pulmonary arteries adequately opacified and patent. No evidence of pulmonary embolism. Mediastinum/Nodes: Esophagus unremarkable. Base of cervical region normal appearance. No thoracic adenopathy. Lungs/Pleura: Patchy BILATERAL pulmonary infiltrates consistent with multifocal  pneumonia and COVID-19. No pleural  effusion or pneumothorax. Upper Abdomen: Visualized liver and spleen unremarkable Musculoskeletal: Unremarkable Review of the MIP images confirms the above findings. IMPRESSION: No evidence of pulmonary embolism. Patchy BILATERAL pulmonary infiltrates consistent with multifocal pneumonia and COVID-19. Aortic Atherosclerosis (ICD10-I70.0). Electronically Signed   By: Lavonia Dana M.D.   On: 01/28/2021 17:01   Portable chest 1 View  Result Date: 01/28/2021 CLINICAL DATA:  Shortness of breath.  COVID positive. EXAM: PORTABLE CHEST 1 VIEW COMPARISON:  None. FINDINGS: Normal heart size. No pleural effusion. Right upper lobe, left mid and left lower lobe interstitial and airspace densities are identified compatible with history of COVID. No pleural effusion. The visualized osseous structures are unremarkable. IMPRESSION: Bilateral pulmonary opacities compatible with history of COVID-19 infection. Electronically Signed   By: Kerby Moors M.D.   On: 01/28/2021 12:39    Procedures .Critical Care Performed by: Daleen Bo, MD Authorized by: Daleen Bo, MD   Critical care provider statement:    Critical care time (minutes):  35   Critical care start time:  01/28/2021 12:05 PM   Critical care end time:  01/28/2021 4:30 PM   Critical care time was exclusive of:  Separately billable procedures and treating other patients   Critical care was necessary to treat or prevent imminent or life-threatening deterioration of the following conditions:  Respiratory failure   Critical care was time spent personally by me on the following activities:  Blood draw for specimens, development of treatment plan with patient or surrogate, discussions with consultants, evaluation of patient's response to treatment, examination of patient, obtaining history from patient or surrogate, ordering and performing treatments and interventions, ordering and review of laboratory studies, pulse oximetry, re-evaluation of patient's  condition, review of old charts and ordering and review of radiographic studies     Medications Ordered in ED Medications  atorvastatin (LIPITOR) tablet 80 mg (has no administration in time range)  diazepam (VALIUM) tablet 10 mg (has no administration in time range)  tamsulosin (FLOMAX) capsule 0.4 mg (has no administration in time range)  benzonatate (TESSALON) capsule 200 mg (has no administration in time range)  fluticasone (FLONASE) 50 MCG/ACT nasal spray 2 spray (has no administration in time range)  hydrALAZINE (APRESOLINE) tablet 25 mg (has no administration in time range)  Ipratropium-Albuterol (COMBIVENT) respimat 1 puff (has no administration in time range)  methylPREDNISolone sodium succinate (SOLU-MEDROL) 125 mg/2 mL injection 81.875 mg (has no administration in time range)    Followed by  predniSONE (DELTASONE) tablet 50 mg (has no administration in time range)  guaiFENesin-dextromethorphan (ROBITUSSIN DM) 100-10 MG/5ML syrup 10 mL (has no administration in time range)  insulin aspart (novoLOG) injection 0-9 Units (has no administration in time range)  insulin aspart (novoLOG) injection 0-5 Units (has no administration in time range)  acetaminophen (TYLENOL) tablet 650 mg (has no administration in time range)  ondansetron (ZOFRAN) tablet 4 mg (has no administration in time range)    Or  ondansetron (ZOFRAN) injection 4 mg (has no administration in time range)  0.9 %  sodium chloride infusion (has no administration in time range)  apixaban (ELIQUIS) tablet 5 mg (has no administration in time range)  methylPREDNISolone sodium succinate (SOLU-MEDROL) 125 mg/2 mL injection 125 mg (125 mg Intravenous Given 01/28/21 1439)  iohexol (OMNIPAQUE) 350 MG/ML injection 60 mL (60 mLs Intravenous Contrast Given 01/28/21 1654)    ED Course  I have reviewed the triage vital signs and the nursing notes.  Pertinent labs & imaging  results that were available during my care of the patient were  reviewed by me and considered in my medical decision making (see chart for details).    MDM Rules/Calculators/A&P                           Patient Vitals for the past 24 hrs:  BP Temp Temp src Pulse Resp SpO2  01/28/21 1820 (!) 104/41 97.8 F (36.6 C) -- 60 20 94 %  01/28/21 1600 94/63 -- -- (!) 53 18 95 %  01/28/21 1545 (!) 104/51 -- -- 67 (!) 29 94 %  01/28/21 1500 (!) 88/54 -- -- (!) 52 17 95 %  01/28/21 1414 96/63 -- -- 66 (!) 24 91 %  01/28/21 1400 (!) 84/55 -- -- 66 20 93 %  01/28/21 1345 104/68 -- -- 65 (!) 25 94 %  01/28/21 1300 (!) 98/49 -- -- (!) 54 20 93 %  01/28/21 1243 -- 99.1 F (37.3 C) Oral -- -- --  01/28/21 1208 -- -- -- -- -- 94 %  01/28/21 1203 130/84 -- -- 81 (!) 25 91 %    2:21 PM Reevaluation with update and discussion. After initial assessment and treatment, an updated evaluation reveals no change in status.  Findings discussed and questions answered. Daleen Bo   Medical Decision Making:  This patient is presenting for evaluation of shortness of breath and Covid infection, which does require a range of treatment options, and is a complaint that involves a high risk of morbidity and mortality. The differential diagnoses include pneumonia, PE, complications from Covid infection. I decided to review old records, and in summary elderly male, not vaccinated against COVID, presenting for worsening symptoms with known Covid infection x7 days.  He is not a candidate for antiviral treatment.  I did not require additional historical information from anyone.  Clinical Laboratory Tests Ordered, included CBC, Metabolic panel and Lactate, procalcitonin, D-dimer. Review indicates no except lactate high, sodium low, glucose high, BUN high, creatinine high, calcium low, total protein low, albumin low, AST high, GFR low, PHI, D-dimer high. Radiologic Tests Ordered, included chest x-ray.  I independently Visualized: Radiographic images, which show bilateral infiltrates  consistent with viral infection  Cardiac Monitor Tracing which shows normal sinus rhythm    Critical Interventions-clinical evaluation, laboratory testing, radiography, oxygen therapy to normalize oxygen status, Solu-Medrol, CT ordered to evaluate for PE observation and reassessment  After These Interventions, the Patient was reevaluated and was found with Covid infection, and hypoxia, requiring treatment with oxygen.  Patient unable to be vaccinated or treated for Covid infection due to prior history of Guillain-Barr.  He will require supportive management.  D-dimer elevated, PE study ordered, GFR low, but needs baseline.  It is above 30 so CT ordered, to evaluate for PE.  CRITICAL CARE-yes Performed by: Daleen Bo  Nursing Notes Reviewed/ Care Coordinated Applicable Imaging Reviewed Interpretation of Laboratory Data incorporated into ED treatment   4:07 PM-Consult complete with hospitalist. Patient case explained and discussed.  He agrees to admit patient for further evaluation and treatment. Call ended at 4:25 PM    Final Clinical Impression(s) / ED Diagnoses Final diagnoses:  Shortness of breath  COVID-19 virus infection  Hypoxia    Rx / DC Orders ED Discharge Orders    None       Daleen Bo, MD 01/28/21 [phone]

## 2021-01-28 NOTE — ED Triage Notes (Signed)
Pt did a home COVID test. Dr given medication

## 2021-01-28 NOTE — ED Triage Notes (Signed)
Pt.stated, I tested positive for COVID on Monday. My sysmptoms seems worse. I feel weaker and coughng a lot.

## 2021-01-28 NOTE — ED Triage Notes (Signed)
Pt. Stated, he did a home test on Monday,

## 2021-01-28 NOTE — Progress Notes (Signed)
ANTICOAGULATION CONSULT NOTE - Initial Consult  Pharmacy Consult for Apixaban Indication: atrial fibrillation  Allergies  Allergen Reactions  . Lovastatin Other (See Comments)    REACTION: Foot pain  . Simvastatin Other (See Comments)    REACTION: Myalgias    Patient Measurements:   Heparin Dosing Weight:   Vital Signs: Temp: 99.1 F (37.3 C) (03/26 1243) Temp Source: Oral (03/26 1243) BP: 94/63 (03/26 1600) Pulse Rate: 53 (03/26 1600)  Labs: Recent Labs    01/28/21 1242 01/28/21 1252  HGB 15.6 15.6  HCT 46.0 46.0  PLT 187  --   CREATININE 1.88*  --     Estimated Creatinine Clearance: 31.3 mL/min (A) (by C-G formula based on SCr of 1.88 mg/dL (H)).   Medical History: Past Medical History:  Diagnosis Date  . CKD (chronic kidney disease), stage III ([place])   . Dyslipidemia   . ED (erectile dysfunction)   . History of Guillain-Barre syndrome    per pt 1990s fron Flu Vaccine,  stated resolved with no residual  . History of nuclear stress test    08-23-2004  by dr Ron Parker--- normal without ischemia,  ef 68%  . Hyperplasia of prostate with lower urinary tract symptoms (LUTS)   . Hypertension   . NASH (nonalcoholic steatohepatitis) 2010   followed by pcp  . Prostate cancer [facility]) urologist-- dr winter;  oncologist-- dr Tammi Klippel   dx 02-26-2019,  Stage T1c,  Gleason 3+4, vol 80.1cc;  plan external radiation  . Rhinorrhea   . Type 2 diabetes mellitus ([facility])     Medications:  Scheduled:  . apixaban  5 mg Oral BID  . [START ON 01/29/2021] atorvastatin  80 mg Oral Daily  . insulin aspart  0-5 Units Subcutaneous QHS  . insulin aspart  0-9 Units Subcutaneous TID WC  . Ipratropium-Albuterol  1 puff Inhalation Q6H  . methylPREDNISolone (SOLU-MEDROL) injection  1 mg/kg Intravenous Q12H   Followed by  . [START ON 01/31/2021] predniSONE  50 mg Oral Daily  . tamsulosin  0.4 mg Oral QHS    Assessment: Patient is a 59 yom that is being admitted for SOB. Patient has a hx of  afib with only rate control at this time. CHADS2 score ~3. Pharmacy has been asked to dose apixaban at this time.               Goal of Therapy:  . Monitor platelets by anticoagulation protocol: Yes   Plan:  - Start Apixaban 70m PO BID - Monitor for s/s of bleeding and educate patient while inpatient  JDuanne LimerickPharmD. BCPS 01/28/2021,5:31 PM

## 2021-01-28 NOTE — ED Notes (Signed)
Report called to rn on 5 w 

## 2021-01-28 NOTE — ED Notes (Signed)
Placed patient on the monitor. Pt found to 87% on RA. Strong, dry cough assesed with no sputum production. Placed patient on 2L [place]. O2 saturation 94% at this time. Cardiac monitor in place. Pt in NAD. Resp even and unlabored. VSS. Bed in low position. Call bell within reach.

## 2021-01-28 NOTE — ED Notes (Signed)
Provider made aware of BP tending low. Will await further orders.

## 2021-01-28 NOTE — ED Notes (Signed)
Daughter at  The bedside trying to decide  Whether to go home or stay

## 2021-01-29 ENCOUNTER — Inpatient Hospital Stay (HOSPITAL_COMMUNITY): Payer: Medicare Other

## 2021-01-29 LAB — COMPREHENSIVE METABOLIC PANEL
ALT: 27 U/L (ref 0–44)
AST: 47 U/L — ABNORMAL HIGH (ref 15–41)
Albumin: 2.3 g/dL — ABNORMAL LOW (ref 3.5–5.0)
Alkaline Phosphatase: 72 U/L (ref 38–126)
Anion gap: 10 (ref 5–15)
BUN: 44 mg/dL — ABNORMAL HIGH (ref 8–23)
CO2: 23 mmol/L (ref 22–32)
Calcium: 8.5 mg/dL — ABNORMAL LOW (ref 8.9–10.3)
Chloride: 103 mmol/L (ref 98–111)
Creatinine, Ser: 1.64 mg/dL — ABNORMAL HIGH (ref 0.61–1.24)
GFR, Estimated: 43 mL/min — ABNORMAL LOW (ref >60)
Glucose, Bld: 191 mg/dL — ABNORMAL HIGH (ref 70–99)
Potassium: 3.8 mmol/L (ref 3.5–5.1)
Sodium: 136 mmol/L (ref 135–145)
Total Bilirubin: 0.8 mg/dL (ref 0.3–1.2)
Total Protein: 5.8 g/dL — ABNORMAL LOW (ref 6.5–8.1)

## 2021-01-29 LAB — CBC WITH DIFFERENTIAL/PLATELET
Abs Immature Granulocytes: 0.04 10*3/uL (ref 0.00–0.07)
Basophils Absolute: 0 10*3/uL (ref 0.0–0.1)
Basophils Relative: 0 %
Eosinophils Absolute: 0 10*3/uL (ref 0.0–0.5)
Eosinophils Relative: 0 %
HCT: 41.1 % (ref 39.0–52.0)
Hemoglobin: 14.2 g/dL (ref 13.0–17.0)
Immature Granulocytes: 1 %
Lymphocytes Relative: 7 %
Lymphs Abs: 0.4 10*3/uL — ABNORMAL LOW (ref 0.7–4.0)
MCH: 30.7 pg (ref 26.0–34.0)
MCHC: 34.5 g/dL (ref 30.0–36.0)
MCV: 89 fL (ref 80.0–100.0)
Monocytes Absolute: 0.2 10*3/uL (ref 0.1–1.0)
Monocytes Relative: 3 %
Neutro Abs: 4.8 10*3/uL (ref 1.7–7.7)
Neutrophils Relative %: 89 %
Platelets: 205 10*3/uL (ref 150–400)
RBC: 4.62 MIL/uL (ref 4.22–5.81)
RDW: 13.7 % (ref 11.5–15.5)
WBC: 5.4 10*3/uL (ref 4.0–10.5)
nRBC: 0 % (ref 0.0–0.2)

## 2021-01-29 LAB — URINALYSIS, ROUTINE W REFLEX MICROSCOPIC
Bacteria, UA: NONE SEEN
Bilirubin Urine: NEGATIVE
Glucose, UA: NEGATIVE mg/dL
Hgb urine dipstick: NEGATIVE
Ketones, ur: NEGATIVE mg/dL
Leukocytes,Ua: NEGATIVE
Nitrite: NEGATIVE
Protein, ur: 30 mg/dL — AB
Specific Gravity, Urine: 1.029 (ref 1.005–1.030)
pH: 5 (ref 5.0–8.0)

## 2021-01-29 LAB — BRAIN NATRIURETIC PEPTIDE: B Natriuretic Peptide: 253.1 pg/mL — ABNORMAL HIGH (ref 0.0–100.0)

## 2021-01-29 LAB — C-REACTIVE PROTEIN: CRP: 13.7 mg/dL — ABNORMAL HIGH (ref <1.0)

## 2021-01-29 LAB — FERRITIN: Ferritin: 384 ng/mL — ABNORMAL HIGH (ref 24–336)

## 2021-01-29 LAB — GLUCOSE, CAPILLARY
Glucose-Capillary: 175 mg/dL — ABNORMAL HIGH (ref 70–99)
Glucose-Capillary: 186 mg/dL — ABNORMAL HIGH (ref 70–99)
Glucose-Capillary: 200 mg/dL — ABNORMAL HIGH (ref 70–99)
Glucose-Capillary: 213 mg/dL — ABNORMAL HIGH (ref 70–99)

## 2021-01-29 LAB — PROCALCITONIN: Procalcitonin: <0.1 ng/mL

## 2021-01-29 LAB — MAGNESIUM: Magnesium: 2 mg/dL (ref 1.7–2.4)

## 2021-01-29 LAB — PHOSPHORUS: Phosphorus: 3.2 mg/dL (ref 2.5–4.6)

## 2021-01-29 LAB — D-DIMER, QUANTITATIVE: D-Dimer, Quant: 1.21 ug/mL-FEU — ABNORMAL HIGH (ref 0.00–0.50)

## 2021-01-29 MED ORDER — LACTATED RINGERS IV SOLN: INTRAVENOUS | Status: AC

## 2021-01-29 MED ORDER — VALACYCLOVIR HCL 500 MG PO TABS
500.0000 mg | ORAL_TABLET | Freq: Every day | ORAL | Status: DC
  Administered 2021-01-29 – 2021-02-02 (×5): 500 mg via ORAL
  Filled 2021-01-29 (×5): qty 1

## 2021-01-29 MED ORDER — METHYLPREDNISOLONE SODIUM SUCC 125 MG IJ SOLR
60.0000 mg | Freq: Two times a day (BID) | INTRAMUSCULAR | Status: DC
  Administered 2021-01-29 – 2021-02-01 (×8): 60 mg via INTRAVENOUS
  Filled 2021-01-29 (×8): qty 2

## 2021-01-29 MED ORDER — ASPIRIN EC 81 MG PO TBEC
81.0000 mg | DELAYED_RELEASE_TABLET | Freq: Every day | ORAL | Status: DC
  Administered 2021-01-29 – 2021-02-01 (×4): 81 mg via ORAL
  Filled 2021-01-29 (×4): qty 1

## 2021-01-29 MED ORDER — TOCILIZUMAB 400 MG/20ML IV SOLN
8.0000 mg/kg | Freq: Once | INTRAVENOUS | Status: AC
  Administered 2021-01-29: 642 mg via INTRAVENOUS
  Filled 2021-01-29: qty 10

## 2021-01-29 NOTE — Progress Notes (Signed)
Pt placed on CPAP dreamstation. Pt is tolerating it well at this time. O2 4l bled in. No distress.

## 2021-01-29 NOTE — Progress Notes (Addendum)
2100: 2.30 sec SVR  2245: 2.68 sec SVR  0234: 3.0 sec SVR  0419: 2.47 sec SVR  0532: 3.0 sec SVR  Reported by Bethena Roys to RN

## 2021-01-29 NOTE — Progress Notes (Signed)
Pt is complaining of burning during urination.

## 2021-01-29 NOTE — Progress Notes (Signed)
PROGRESS NOTE                                                                                                                                                                                                             Patient Demographics:    [name], is a 80 y.o. male, DOB - 07-Jun-1941, QJF:[id number]  Outpatient Primary MD for the patient is Vivi Barrack, MD    LOS - 1  Admit date - 01/28/2021    Chief Complaint  Patient presents with  . Covid Positive  . Weakness  . Cough       Brief Narrative (HPI from H&P)  - [name] is a 80 y.o. male with medical history significant of CKD stage III, HTN, IIDM, HLD, Guillain-Barr syndrome, prostate cancer status post radiation, presented with increasing shortness of breath and cough, he is unvaccinated for COVID-19, in the ER was diagnosed with acute hypoxic respiratory failure due to COVID-19 pneumonia.   Subjective:    [name] today has, No headache, No chest pain, No abdominal pain - No Nausea, No new weakness tingling or numbness, +ve SOB.   Assessment  & Plan :    Active Problems:   COVID-19 virus infection   COVID-19   1. Acute Hypoxic Resp. Failure due to Acute Covid 19 Viral Pneumonitis during the ongoing 2020 Covid 19 Pandemic - he is not vaccinated for COVID-19, chose not to take the vaccine due to previous history of Gullian Barr syndrome.  Also does not want to try remdesivir.  His hypoxia is gradually worsening and he is now on 4 L of oxygen with pulse ox in high 80s, at this point he is on IV steroids which will be continued since options are limited due to his preferences we will try Actemra ASAP, he has consented for use.  Encouraged the patient to sit up in chair in the daytime use I-S and flutter valve for pulmonary toiletry.  Will advance activity and titrate down oxygen as possible.  Actemra/Baricitinib  off label use - patient was  told that if COVID-19 pneumonitis gets worse we might potentially use Actemra off label, patient denies any known history of active diverticulitis, tuberculosis or hepatitis, understands the risks and benefits and wants to proceed with Actemra treatment if required.    Recent Labs  Lab 01/28/21  1157 01/28/21 1242 01/28/21 1252 01/28/21 1435 01/29/21 0236  WBC  --  7.4  --   --  5.4  HGB  --  15.6 15.6  --  14.2  HCT  --  46.0 46.0  --  41.1  PLT  --  187  --   --  205  CRP  --   --   --   --  13.7*  DDIMER  --  1.93*  --   --  1.21*  PROCALCITON  --  <0.10  --   --   --   AST  --  57*  --   --  47*  ALT  --  31  --   --  27  ALKPHOS  --  82  --   --  72  BILITOT  --  1.0  --   --  0.8  ALBUMIN  --  2.6*  --   --  2.3*  LATICACIDVEN  --  2.2*  --  2.3*  --   SARSCOV2NAA POSITIVE*  --   --   --   --     2.  A. fib.  Mali vas 2 score of 3.  Continue Eliquis along with telemetry monitoring.  3.  AKI on CKD stage IIIa.  Baseline creatinine around 1.5.  Monitor.  4.  BPH.  On Flomax, will check UA as he has intermittent dysuria.   5.  Dyslipidemia.  On statin.  6.  DM type II.  Will place on sliding scale and monitor.  Lab Results  Component Value Date   HGBA1C 6.1 (A) 12/07/2020   CBG (last 3)  Recent Labs    01/28/21 1827 01/28/21 2027 01/29/21 0756  GLUCAP 189* 275* 175*         Condition - Extremely Guarded  Family Communication  : Listed contact of wife is deceased.  Code Status : Full  Consults  :  None  PUD Prophylaxis : None   Procedures  :     CTA - no PE, multifocal pneumonia bilateral.      Disposition Plan  :    Status is: Inpatient  Remains inpatient appropriate because:IV treatments appropriate due to intensity of illness or inability to take PO   Dispo: The patient is from: Home              Anticipated d/c is to: Home              Patient currently is not medically stable to d/c.   Difficult to place patient No  DVT Prophylaxis   :  Eliquis  Lab Results  Component Value Date   PLT 205 01/29/2021    Diet :  Diet Order            Diet heart healthy/carb modified Room service appropriate? Yes; Fluid consistency: Thin  Diet effective now                  Inpatient Medications  Scheduled Meds: . apixaban  5 mg Oral BID  . atorvastatin  80 mg Oral Daily  . insulin aspart  0-5 Units Subcutaneous QHS  . insulin aspart  0-9 Units Subcutaneous TID WC  . Ipratropium-Albuterol  1 puff Inhalation Q6H  . methylPREDNISolone (SOLU-MEDROL) injection  60 mg Intravenous BID  . tamsulosin  0.4 mg Oral QHS   Continuous Infusions: . sodium chloride 100 mL/hr at 01/29/21 0839   PRN Meds:.acetaminophen, benzonatate, diazepam, fluticasone, guaiFENesin-dextromethorphan, hydrALAZINE, [DISCONTINUED]  ondansetron **OR** ondansetron (ZOFRAN) IV  Antibiotics  :    Anti-infectives (From admission, onward)   None       Time Spent in minutes  30   Lala Lund M.D on 01/29/2021 at 9:49 AM  To page go to [email or web address]   [facility] -  Office  [phone]     See all Orders from today for further details    Objective:   Vitals:   01/28/21 2045 01/28/21 2310 01/29/21 0309 01/29/21 0757  BP: (!) 108/53 112/68 105/63 109/60  Pulse: (!) 54 (!) 55 62 60  Resp: 18 20 14  (!) 21  Temp: 98 F (36.7 C) 98.2 F (36.8 C) 98.6 F (37 C) 97.7 F (36.5 C)  TempSrc: Oral Oral Oral Axillary  SpO2:  92% 90% 90%  Weight: 81.6 kg  80.2 kg   Height: 5' 8"  (1.727 m)       Wt Readings from Last 3 Encounters:  01/29/21 80.2 kg  01/24/21 81.6 kg  12/07/20 84.3 kg     Intake/Output Summary (Last 24 hours) at 01/29/2021 0949 Last data filed at 01/29/2021 0400 Gross per 24 hour  Intake 209.98 ml  Output 375 ml  Net -165.02 ml     Physical Exam  Awake Alert, No new F.N deficits, Normal affect [place].AT,PERRAL Supple Neck,No JVD, No cervical lymphadenopathy appriciated.  Symmetrical Chest wall movement, Good air  movement bilaterally,few rales RRR,No Gallops,Rubs or new Murmurs, No Parasternal Heave +ve B.Sounds, Abd Soft, No tenderness, No organomegaly appriciated, No rebound - guarding or rigidity. No Cyanosis, Clubbing or edema, No new Rash or bruise       Data Review:    CBC Recent Labs  Lab 01/28/21 1242 01/28/21 1252 01/29/21 0236  WBC 7.4  --  5.4  HGB 15.6 15.6 14.2  HCT 46.0 46.0 41.1  PLT 187  --  205  MCV 90.6  --  89.0  MCH 30.7  --  30.7  MCHC 33.9  --  34.5  RDW 13.7  --  13.7  LYMPHSABS 0.3*  --  0.4*  MONOABS 0.7  --  0.2  EOSABS 0.0  --  0.0  BASOSABS 0.0  --  0.0    Recent Labs  Lab 01/28/21 1242 01/28/21 1252 01/28/21 1435 01/29/21 0236  NA 134* 135  --  136  K 3.8 3.9  --  3.8  CL 100  --   --  103  CO2 22  --   --  23  GLUCOSE 249*  --   --  191*  BUN 36*  --   --  44*  CREATININE 1.88*  --   --  1.64*  CALCIUM 8.7*  --   --  8.5*  AST 57*  --   --  47*  ALT 31  --   --  27  ALKPHOS 82  --   --  72  BILITOT 1.0  --   --  0.8  ALBUMIN 2.6*  --   --  2.3*  MG  --   --   --  2.0  CRP  --   --   --  13.7*  DDIMER 1.93*  --   --  1.21*  PROCALCITON <0.10  --   --   --   LATICACIDVEN 2.2*  --  2.3*  --     ------------------------------------------------------------------------------------------------------------------ No results for input(s): CHOL, HDL, LDLCALC, TRIG, CHOLHDL, LDLDIRECT in the last 72 hours.  Lab Results  Component Value  Date   HGBA1C 6.1 (A) 12/07/2020   ------------------------------------------------------------------------------------------------------------------ No results for input(s): TSH, T4TOTAL, T3FREE, THYROIDAB in the last 72 hours.  Invalid input(s): FREET3  Cardiac Enzymes No results for input(s): CKMB, TROPONINI, MYOGLOBIN in the last 168 hours.  Invalid input(s): CK ------------------------------------------------------------------------------------------------------------------ No results found for:  BNP  Micro Results Recent Results (from the past 240 hour(s))  SARS CORONAVIRUS 2 (TAT 6-24 HRS) Nasopharyngeal Nasopharyngeal Swab     Status: Abnormal   Collection Time: 01/28/21 11:57 AM   Specimen: Nasopharyngeal Swab  Result Value Ref Range Status   SARS Coronavirus 2 POSITIVE (A) NEGATIVE Final    Comment: (NOTE) SARS-CoV-2 target nucleic acids are DETECTED.  The SARS-CoV-2 RNA is generally detectable in upper and lower respiratory specimens during the acute phase of infection. Positive results are indicative of the presence of SARS-CoV-2 RNA. Clinical correlation with patient history and other diagnostic information is  necessary to determine patient infection status. Positive results do not rule out bacterial infection or co-infection with other viruses.  The expected result is Negative.  Fact Sheet for Patients: SugarRoll.be  Fact Sheet for Healthcare Providers: https://www.woods-mathews.com/  This test is not yet approved or cleared by the [place] FDA and  has been authorized for detection and/or diagnosis of SARS-CoV-2 by FDA under an Emergency Use Authorization (EUA). This EUA will remain  in effect (meaning this test can be used) for the duration of the COVID-19 declaration under Section 564(b)(1) of the Act, 21 U. S.C. section 360bbb-3(b)(1), unless the authorization is terminated or revoked sooner.   Performed at [facility], [place] [address], [place], [place] [address]     Radiology Reports CT Angio Chest PE W/Cm &/Or Wo Cm  Result Date: 01/28/2021 CLINICAL DATA:  Elevated D-dimer, COVID-19 positive, increased coughing since Monday. History of NASH, stage III chronic kidney disease, Guillain-Barre syndrome, hypertension, prostate cancer, type II diabetes mellitus EXAM: CT ANGIOGRAPHY CHEST WITH CONTRAST TECHNIQUE: Multidetector CT imaging of the chest was performed using the standard protocol during bolus  administration of intravenous contrast. Multiplanar CT image reconstructions and MIPs were obtained to evaluate the vascular anatomy. CONTRAST:  60m OMNIPAQUE IOHEXOL 350 MG/ML SOLN IV COMPARISON:  None FINDINGS: Cardiovascular: Atherosclerotic calcifications aorta, proximal great vessels, and minimally in coronary arteries. Aorta normal caliber without aneurysm. Heart unremarkable. No pericardial effusion. Pulmonary arteries adequately opacified and patent. No evidence of pulmonary embolism. Mediastinum/Nodes: Esophagus unremarkable. Base of cervical region normal appearance. No thoracic adenopathy. Lungs/Pleura: Patchy BILATERAL pulmonary infiltrates consistent with multifocal pneumonia and COVID-19. No pleural effusion or pneumothorax. Upper Abdomen: Visualized liver and spleen unremarkable Musculoskeletal: Unremarkable Review of the MIP images confirms the above findings. IMPRESSION: No evidence of pulmonary embolism. Patchy BILATERAL pulmonary infiltrates consistent with multifocal pneumonia and COVID-19. Aortic Atherosclerosis (ICD10-I70.0). Electronically Signed   By: MLavonia DanaM.D.   On: 01/28/2021 17:01   DG Chest Port 1 View  Result Date: 01/29/2021 CLINICAL DATA:  Shortness of breath.  COVID positive EXAM: PORTABLE CHEST 1 VIEW COMPARISON:  Yesterday FINDINGS: Mild progression of bilateral patchy infiltrate especially along the right minor fissure. No edema, effusion, or pneumothorax. Normal heart size. IMPRESSION: Atypical pneumonia with mild progression from yesterday. Electronically Signed   By: JMonte FantasiaM.D.   On: 01/29/2021 08:21   Portable chest 1 View  Result Date: 01/28/2021 CLINICAL DATA:  Shortness of breath.  COVID positive. EXAM: PORTABLE CHEST 1 VIEW COMPARISON:  None. FINDINGS: Normal heart size. No pleural effusion. Right upper lobe,  left mid and left lower lobe interstitial and airspace densities are identified compatible with history of COVID. No pleural effusion. The  visualized osseous structures are unremarkable. IMPRESSION: Bilateral pulmonary opacities compatible with history of COVID-19 infection. Electronically Signed   By: Kerby Moors M.D.   On: 01/28/2021 12:39

## 2021-01-30 ENCOUNTER — Telehealth: Payer: Self-pay

## 2021-01-30 LAB — COMPREHENSIVE METABOLIC PANEL
ALT: 31 U/L (ref 0–44)
AST: 46 U/L — ABNORMAL HIGH (ref 15–41)
Albumin: 2.3 g/dL — ABNORMAL LOW (ref 3.5–5.0)
Alkaline Phosphatase: 72 U/L (ref 38–126)
Anion gap: 9 (ref 5–15)
BUN: 57 mg/dL — ABNORMAL HIGH (ref 8–23)
CO2: 21 mmol/L — ABNORMAL LOW (ref 22–32)
Calcium: 8.8 mg/dL — ABNORMAL LOW (ref 8.9–10.3)
Chloride: 107 mmol/L (ref 98–111)
Creatinine, Ser: 1.65 mg/dL — ABNORMAL HIGH (ref 0.61–1.24)
GFR, Estimated: 42 mL/min — ABNORMAL LOW (ref >60)
Glucose, Bld: 180 mg/dL — ABNORMAL HIGH (ref 70–99)
Potassium: 3.6 mmol/L (ref 3.5–5.1)
Sodium: 137 mmol/L (ref 135–145)
Total Bilirubin: 0.7 mg/dL (ref 0.3–1.2)
Total Protein: 5.7 g/dL — ABNORMAL LOW (ref 6.5–8.1)

## 2021-01-30 LAB — URINE CULTURE: Culture: NO GROWTH

## 2021-01-30 LAB — CBC WITH DIFFERENTIAL/PLATELET
Abs Immature Granulocytes: 0.33 10*3/uL — ABNORMAL HIGH (ref 0.00–0.07)
Basophils Absolute: 0 10*3/uL (ref 0.0–0.1)
Basophils Relative: 0 %
Eosinophils Absolute: 0.1 10*3/uL (ref 0.0–0.5)
Eosinophils Relative: 0 %
HCT: 39.9 % (ref 39.0–52.0)
Hemoglobin: 13.9 g/dL (ref 13.0–17.0)
Immature Granulocytes: 2 %
Lymphocytes Relative: 4 %
Lymphs Abs: 0.8 10*3/uL (ref 0.7–4.0)
MCH: 31 pg (ref 26.0–34.0)
MCHC: 34.8 g/dL (ref 30.0–36.0)
MCV: 89.1 fL (ref 80.0–100.0)
Monocytes Absolute: 0.9 10*3/uL (ref 0.1–1.0)
Monocytes Relative: 4 %
Neutro Abs: 18.3 10*3/uL — ABNORMAL HIGH (ref 1.7–7.7)
Neutrophils Relative %: 90 %
Platelets: 260 10*3/uL (ref 150–400)
RBC: 4.48 MIL/uL (ref 4.22–5.81)
RDW: 13.6 % (ref 11.5–15.5)
WBC: 20.4 10*3/uL — ABNORMAL HIGH (ref 4.0–10.5)
nRBC: 0 % (ref 0.0–0.2)

## 2021-01-30 LAB — C-REACTIVE PROTEIN: CRP: 5.5 mg/dL — ABNORMAL HIGH (ref <1.0)

## 2021-01-30 LAB — GLUCOSE, CAPILLARY
Glucose-Capillary: 181 mg/dL — ABNORMAL HIGH (ref 70–99)
Glucose-Capillary: 235 mg/dL — ABNORMAL HIGH (ref 70–99)
Glucose-Capillary: 170 mg/dL — ABNORMAL HIGH (ref 70–99)
Glucose-Capillary: 193 mg/dL — ABNORMAL HIGH (ref 70–99)

## 2021-01-30 LAB — PROCALCITONIN: Procalcitonin: <0.1 ng/mL

## 2021-01-30 LAB — MAGNESIUM: Magnesium: 2 mg/dL (ref 1.7–2.4)

## 2021-01-30 LAB — D-DIMER, QUANTITATIVE: D-Dimer, Quant: 0.83 ug/mL-FEU — ABNORMAL HIGH (ref 0.00–0.50)

## 2021-01-30 LAB — TSH: TSH: 0.455 u[IU]/mL (ref 0.350–4.500)

## 2021-01-30 MED ORDER — LACTATED RINGERS IV SOLN: INTRAVENOUS | Status: AC

## 2021-01-30 MED ORDER — IPRATROPIUM-ALBUTEROL 20-100 MCG/ACT IN AERS
1.0000 | INHALATION_SPRAY | Freq: Four times a day (QID) | RESPIRATORY_TRACT | Status: DC | PRN
  Filled 2021-01-30: qty 4

## 2021-01-30 MED ORDER — DIAZEPAM 2 MG PO TABS: 5.0000 mg | ORAL_TABLET | Freq: Two times a day (BID) | ORAL | Status: DC | PRN

## 2021-01-30 MED ORDER — POTASSIUM CHLORIDE CRYS ER 20 MEQ PO TBCR
20.0000 meq | EXTENDED_RELEASE_TABLET | Freq: Once | ORAL | Status: AC
  Administered 2021-01-30: 20 meq via ORAL
  Filled 2021-01-30: qty 1

## 2021-01-30 NOTE — Progress Notes (Signed)
Transitions of Care Pharmacist Note  [name] is a 80 y.o. male that has been diagnosed with A Fib and will be prescribed Eliquis (apixaban) at discharge.   Patient Education: I provided the following education on Eliquis (apixaban) to the patient: How to take the medication Described what the medication is Signs of bleeding Signs/symptoms of VTE and stroke  Answered their questions  Discharge Medications Plan: The patient wants to have their discharge medications filled by the Transitions of Care pharmacy rather than their usual pharmacy.  The discharge orders pharmacy has been changed to the Transitions of Care pharmacy, the patient will receive a phone call regarding co-pay, and their medications will be delivered by the Trans[facility] of [facility] pharmacy.    Thank you,  Wilson Singer, PharmD PGY1 Pharmacy Resident 01/30/2021 4:39 PM  January 30, 2021

## 2021-01-30 NOTE — Progress Notes (Signed)
Patient removed CPAP- declines to place it back, states :"I will never wear that again, I feel like I am suffocating and it hurts my head tremendously". Offered for the straps to be adjusted; education provided re sleep apnea and CPAP use- cont to decline.

## 2021-01-30 NOTE — Telephone Encounter (Signed)
See note

## 2021-01-30 NOTE — Progress Notes (Signed)
PROGRESS NOTE                                                                                                                                                                                                             Patient Demographics:    [name], is a 80 y.o. male, DOB - May 10, 1941, XBM:[id number]  Outpatient Primary MD for the patient is Vivi Barrack, MD    LOS - 2  Admit date - 01/28/2021    Chief Complaint  Patient presents with  . Covid Positive  . Weakness  . Cough       Brief Narrative (HPI from H&P)  - [name] is a 80 y.o. male with medical history significant of CKD stage III, HTN, IIDM, HLD, Guillain-Barr syndrome, prostate cancer status post radiation, presented with increasing shortness of breath and cough, he is unvaccinated for COVID-19, in the ER was diagnosed with acute hypoxic respiratory failure due to COVID-19 pneumonia.   Subjective:   Patient in bed, appears comfortable, denies any headache, no fever, no chest pain or pressure, improving shortness of breath , no abdominal pain. No focal weakness.   Assessment  & Plan :     1. Acute Hypoxic Resp. Failure due to Acute Covid 19 Viral Pneumonitis during the ongoing 2020 Covid 19 Pandemic - he is not vaccinated for COVID-19, chose not to take the vaccine due to previous history of Gullian Barr syndrome.  Also does not want to try remdesivir.  He has been treated with IV steroids along with Actemra on 01/29/2021, clinically seems to have stabilized, continue to monitor closely. Encouraged the patient to sit up in chair in the daytime use I-S and flutter valve for pulmonary toiletry.  Will advance activity and titrate down oxygen as possible.   Recent Labs  Lab 01/28/21 1157 01/28/21 1242 01/28/21 1252 01/28/21 1435 01/29/21 0236 01/30/21 0249  WBC  --  7.4  --   --  5.4 20.4*  HGB  --  15.6 15.6  --  14.2 13.9  HCT  --  46.0  46.0  --  41.1 39.9  PLT  --  187  --   --  205 260  CRP  --   --   --   --  13.7* 5.5*  BNP  --   --   --   --  253.1*  --   DDIMER  --  1.93*  --   --  1.21* 0.83*  PROCALCITON  --  <0.10  --   --  <0.10 <0.10  AST  --  57*  --   --  47* 46*  ALT  --  31  --   --  27 31  ALKPHOS  --  82  --   --  72 72  BILITOT  --  1.0  --   --  0.8 0.7  ALBUMIN  --  2.6*  --   --  2.3* 2.3*  LATICACIDVEN  --  2.2*  --  2.3*  --   --   SARSCOV2NAA POSITIVE*  --   --   --   --   --    Lab Results  Component Value Date   TSH 1.31 12/07/2020     2.  A. fib with slow ventricular response.  Mali vas 2 score of 3.  Continue Eliquis along with telemetry monitoring.  He has chronic asymptomatic sinus bradycardia.  3.  AKI on CKD stage IIIa.  Baseline creatinine around 1.5. Hydrated will Monitor.  4.  BPH.  On Flomax, stable UA.   5.  Dyslipidemia.  On statin.  6.  Asymptomatic sinus bradycardia with likely sleep apnea at night.  Refuses to try CPAP, avoid AV node blocking agents, blood pressure stable and patient asymptomatic, outpatient cardiology follow-up.   7.  DM type II.  Will place on sliding scale and monitor.  Lab Results  Component Value Date   HGBA1C 6.1 (A) 12/07/2020   CBG (last 3)  Recent Labs    01/29/21 1642 01/29/21 2048 01/30/21 0754  GLUCAP 200* 213* 181*         Condition - Extremely Guarded  Family Communication  : Listed contact of wife is deceased.  Code Status : Full  Consults  :  None  PUD Prophylaxis : None   Procedures  :     CTA - no PE, multifocal pneumonia bilateral.      Disposition Plan  :    Status is: Inpatient  Remains inpatient appropriate because:IV treatments appropriate due to intensity of illness or inability to take PO   Dispo: The patient is from: Home              Anticipated d/c is to: Home              Patient currently is not medically stable to d/c.   Difficult to place patient No  DVT Prophylaxis  :  Eliquis  Lab  Results  Component Value Date   PLT 260 01/30/2021    Diet :  Diet Order            Diet heart healthy/carb modified Room service appropriate? Yes; Fluid consistency: Thin  Diet effective now                  Inpatient Medications  Scheduled Meds: . apixaban  5 mg Oral BID  . aspirin EC  81 mg Oral Daily  . atorvastatin  80 mg Oral Daily  . insulin aspart  0-5 Units Subcutaneous QHS  . insulin aspart  0-9 Units Subcutaneous TID WC  . methylPREDNISolone (SOLU-MEDROL) injection  60 mg Intravenous BID  . tamsulosin  0.4 mg Oral QHS  . valACYclovir  500 mg Oral Daily   Continuous Infusions:  PRN Meds:.acetaminophen, benzonatate, diazepam, fluticasone, guaiFENesin-dextromethorphan, hydrALAZINE, Ipratropium-Albuterol, [DISCONTINUED] ondansetron **OR** ondansetron (ZOFRAN)  IV  Antibiotics  :    Anti-infectives (From admission, onward)   Start     Dose/Rate Route Frequency Ordered Stop   01/29/21 1045  valACYclovir (VALTREX) tablet 500 mg        500 mg Oral Daily 01/29/21 0956         Time Spent in minutes  30   Lala Lund M.D on 01/30/2021 at 10:23 AM  To page go to [email or web address]   [facility] -  Office  [phone]     See all Orders from today for further details    Objective:   Vitals:   01/30/21 0000 01/30/21 0349 01/30/21 0752 01/30/21 0800  BP: 120/70  130/63 134/73  Pulse: 61  67 79  Resp:   20 20  Temp: 98 F (36.7 C) 98.6 F (37 C) (!) 97.5 F (36.4 C)   TempSrc:  Oral Oral   SpO2:   90% 90%  Weight:      Height:        Wt Readings from Last 3 Encounters:  01/29/21 80.2 kg  01/24/21 81.6 kg  12/07/20 84.3 kg     Intake/Output Summary (Last 24 hours) at 01/30/2021 1023 Last data filed at 01/29/2021 2050 Gross per 24 hour  Intake 240 ml  Output 600 ml  Net -360 ml     Physical Exam  Awake Alert, No new F.N deficits, Normal affect .AT,PERRAL Supple Neck,No JVD, No cervical lymphadenopathy appriciated.  Symmetrical  Chest wall movement, Good air movement bilaterally, CTAB RRR,No Gallops, Rubs or new Murmurs, No Parasternal Heave +ve B.Sounds, Abd Soft, No tenderness, No organomegaly appriciated, No rebound - guarding or rigidity. No Cyanosis, Clubbing or edema, No new Rash or bruise       Data Review:    CBC Recent Labs  Lab 01/28/21 1242 01/28/21 1252 01/29/21 0236 01/30/21 0249  WBC 7.4  --  5.4 20.4*  HGB 15.6 15.6 14.2 13.9  HCT 46.0 46.0 41.1 39.9  PLT 187  --  205 260  MCV 90.6  --  89.0 89.1  MCH 30.7  --  30.7 31.0  MCHC 33.9  --  34.5 34.8  RDW 13.7  --  13.7 13.6  LYMPHSABS 0.3*  --  0.4* 0.8  MONOABS 0.7  --  0.2 0.9  EOSABS 0.0  --  0.0 0.1  BASOSABS 0.0  --  0.0 0.0    Recent Labs  Lab 01/28/21 1242 01/28/21 1252 01/28/21 1435 01/29/21 0236 01/30/21 0249  NA 134* 135  --  136 137  K 3.8 3.9  --  3.8 3.6  CL 100  --   --  103 107  CO2 22  --   --  23 21*  GLUCOSE 249*  --   --  191* 180*  BUN 36*  --   --  44* 57*  CREATININE 1.88*  --   --  1.64* 1.65*  CALCIUM 8.7*  --   --  8.5* 8.8*  AST 57*  --   --  47* 46*  ALT 31  --   --  27 31  ALKPHOS 82  --   --  72 72  BILITOT 1.0  --   --  0.8 0.7  ALBUMIN 2.6*  --   --  2.3* 2.3*  MG  --   --   --  2.0 2.0  CRP  --   --   --  13.7* 5.5*  DDIMER 1.93*  --   --  1.21* 0.83*  PROCALCITON <0.10  --   --  <0.10 <0.10  LATICACIDVEN 2.2*  --  2.3*  --   --   BNP  --   --   --  253.1*  --     ------------------------------------------------------------------------------------------------------------------ No results for input(s): CHOL, HDL, LDLCALC, TRIG, CHOLHDL, LDLDIRECT in the last 72 hours.  Lab Results  Component Value Date   HGBA1C 6.1 (A) 12/07/2020   ------------------------------------------------------------------------------------------------------------------ No results for input(s): TSH, T4TOTAL, T3FREE, THYROIDAB in the last 72 hours.  Invalid input(s): FREET3  Cardiac Enzymes No results  for input(s): CKMB, TROPONINI, MYOGLOBIN in the last 168 hours.  Invalid input(s): CK ------------------------------------------------------------------------------------------------------------------    Component Value Date/Time   BNP 253.1 (H) 01/29/2021 0236    Micro Results Recent Results (from the past 240 hour(s))  SARS CORONAVIRUS 2 (TAT 6-24 HRS) Nasopharyngeal Nasopharyngeal Swab     Status: Abnormal   Collection Time: 01/28/21 11:57 AM   Specimen: Nasopharyngeal Swab  Result Value Ref Range Status   SARS Coronavirus 2 POSITIVE (A) NEGATIVE Final    Comment: (NOTE) SARS-CoV-2 target nucleic acids are DETECTED.  The SARS-CoV-2 RNA is generally detectable in upper and lower respiratory specimens during the acute phase of infection. Positive results are indicative of the presence of SARS-CoV-2 RNA. Clinical correlation with patient history and other diagnostic information is  necessary to determine patient infection status. Positive results do not rule out bacterial infection or co-infection with other viruses.  The expected result is Negative.  Fact Sheet for Patients: SugarRoll.be  Fact Sheet for Healthcare Providers: https://www.woods-mathews.com/  This test is not yet approved or cleared by the [place] FDA and  has been authorized for detection and/or diagnosis of SARS-CoV-2 by FDA under an Emergency Use Authorization (EUA). This EUA will remain  in effect (meaning this test can be used) for the duration of the COVID-19 declaration under Section 564(b)(1) of the Act, 21 U. S.C. section 360bbb-3(b)(1), unless the authorization is terminated or revoked sooner.   Performed at [facility], [place] [address], [place], [place] [address]   Culture, Urine     Status: None   Collection Time: 01/29/21  9:13 AM   Specimen: Urine, Random  Result Value Ref Range Status   Specimen Description URINE, RANDOM  Final    Special Requests NONE  Final   Culture   Final    NO GROWTH Performed at [facility], [place] [address], [place], [place] [address]    Report Status 01/30/2021 FINAL  Final    Radiology Reports CT Angio Chest PE W/Cm &/Or Wo Cm  Result Date: 01/28/2021 CLINICAL DATA:  Elevated D-dimer, COVID-19 positive, increased coughing since Monday. History of NASH, stage III chronic kidney disease, Guillain-Barre syndrome, hypertension, prostate cancer, type II diabetes mellitus EXAM: CT ANGIOGRAPHY CHEST WITH CONTRAST TECHNIQUE: Multidetector CT imaging of the chest was performed using the standard protocol during bolus administration of intravenous contrast. Multiplanar CT image reconstructions and MIPs were obtained to evaluate the vascular anatomy. CONTRAST:  47m OMNIPAQUE IOHEXOL 350 MG/ML SOLN IV COMPARISON:  None FINDINGS: Cardiovascular: Atherosclerotic calcifications aorta, proximal great vessels, and minimally in coronary arteries. Aorta normal caliber without aneurysm. Heart unremarkable. No pericardial effusion. Pulmonary arteries adequately opacified and patent. No evidence of pulmonary embolism. Mediastinum/Nodes: Esophagus unremarkable. Base of cervical region normal appearance. No thoracic adenopathy. Lungs/Pleura: Patchy BILATERAL pulmonary infiltrates consistent with multifocal pneumonia and COVID-19. No pleural effusion or pneumothorax. Upper Abdomen: Visualized liver and spleen unremarkable  Musculoskeletal: Unremarkable Review of the MIP images confirms the above findings. IMPRESSION: No evidence of pulmonary embolism. Patchy BILATERAL pulmonary infiltrates consistent with multifocal pneumonia and COVID-19. Aortic Atherosclerosis (ICD10-I70.0). Electronically Signed   By: Lavonia Dana M.D.   On: 01/28/2021 17:01   DG Chest Port 1 View  Result Date: 01/29/2021 CLINICAL DATA:  Shortness of breath.  COVID positive EXAM: PORTABLE CHEST 1 VIEW COMPARISON:  Yesterday FINDINGS: Mild progression  of bilateral patchy infiltrate especially along the right minor fissure. No edema, effusion, or pneumothorax. Normal heart size. IMPRESSION: Atypical pneumonia with mild progression from yesterday. Electronically Signed   By: Monte Fantasia M.D.   On: 01/29/2021 08:21   Portable chest 1 View  Result Date: 01/28/2021 CLINICAL DATA:  Shortness of breath.  COVID positive. EXAM: PORTABLE CHEST 1 VIEW COMPARISON:  None. FINDINGS: Normal heart size. No pleural effusion. Right upper lobe, left mid and left lower lobe interstitial and airspace densities are identified compatible with history of COVID. No pleural effusion. The visualized osseous structures are unremarkable. IMPRESSION: Bilateral pulmonary opacities compatible with history of COVID-19 infection. Electronically Signed   By: Kerby Moors M.D.   On: 01/28/2021 12:39

## 2021-01-30 NOTE — Telephone Encounter (Signed)
Patient's daughter was asked by ER Dr why pt was not put on any medication for Afib, after looking at a stress test that pt had done in 2020. Pt's daughter is wanting someone to call her today.

## 2021-01-30 NOTE — Progress Notes (Signed)
Pt refused cpap for the night.

## 2021-01-30 NOTE — Discharge Instructions (Addendum)
Follow with Primary MD Vivi Barrack, MD in 7 days   Get CBC, CMP, 2 view Chest X ray -  checked next visit within 1 week by Primary MD   Activity: As tolerated with Full fall precautions use walker/cane & assistance as needed  Disposition Home   Diet: Heart Healthy Low Carb.  Special Instructions: If you have smoked or chewed Tobacco  in the last 2 yrs please stop smoking, stop any regular Alcohol  and or any Recreational drug use.  On your next visit with your primary care physician please Get Medicines reviewed and adjusted.  Please request your Prim.MD to go over all Hospital Tests and Procedure/Radiological results at the follow up, please get all Hospital records sent to your Prim MD by signing hospital release before you go home.  If you experience worsening of your admission symptoms, develop shortness of breath, life threatening emergency, suicidal or homicidal thoughts you must seek medical attention immediately by calling 911 or calling your MD immediately  if symptoms less severe.  You Must read complete instructions/literature along with all the possible adverse reactions/side effects for all the Medicines you take and that have been prescribed to you. Take any new Medicines after you have completely understood and accpet all the possible adverse reactions/side effects.         Person Under Monitoring Name: [name]  Location: [address] [address] [place] [address]   Infection Prevention Recommendations for Individuals Confirmed to have, or Being Evaluated for, 2019 Novel Coronavirus (COVID-19) Infection Who Receive Care at Home  Individuals who are confirmed to have, or are being evaluated for, COVID-19 should follow the prevention steps below until a healthcare provider or local or state health department says they can return to normal activities.  Stay home except to get medical care You should restrict activities outside your home, except for getting medical  care. Do not go to work, school, or public areas, and do not use public transportation or taxis.  Call ahead before visiting your doctor Before your medical appointment, call the healthcare provider and tell them that you have, or are being evaluated for, COVID-19 infection. This will help the healthcare provider's office take steps to keep other people from getting infected. Ask your healthcare provider to call the local or state health department.  Monitor your symptoms Seek prompt medical attention if your illness is worsening (e.g., difficulty breathing). Before going to your medical appointment, call the healthcare provider and tell them that you have, or are being evaluated for, COVID-19 infection. Ask your healthcare provider to call the local or state health department.  Wear a facemask You should wear a facemask that covers your nose and mouth when you are in the same room with other people and when you visit a healthcare provider. People who live with or visit you should also wear a facemask while they are in the same room with you.  Separate yourself from other people in your home As much as possible, you should stay in a different room from other people in your home. Also, you should use a separate bathroom, if available.  Avoid sharing household items You should not share dishes, drinking glasses, cups, eating utensils, towels, bedding, or other items with other people in your home. After using these items, you should wash them thoroughly with soap and water.  Cover your coughs and sneezes Cover your mouth and nose with a tissue when you cough or sneeze, or you can cough or  sneeze into your sleeve. Throw used tissues in a lined trash can, and immediately wash your hands with soap and water for at least 20 seconds or use an alcohol-based hand rub.  Wash your [facility] your hands often and thoroughly with soap and water for at least 20 seconds. You can use an alcohol-based  hand sanitizer if soap and water are not available and if your hands are not visibly dirty. Avoid touching your eyes, nose, and mouth with unwashed hands.   Prevention Steps for Caregivers and Household Members of Individuals Confirmed to have, or Being Evaluated for, COVID-19 Infection Being Cared for in the Home  If you live with, or provide care at home for, a person confirmed to have, or being evaluated for, COVID-19 infection please follow these guidelines to prevent infection:  Follow healthcare provider's instructions Make sure that you understand and can help the patient follow any healthcare provider instructions for all care.  Provide for the patient's basic needs You should help the patient with basic needs in the home and provide support for getting groceries, prescriptions, and other personal needs.  Monitor the patient's symptoms If they are getting sicker, call his or her medical provider and tell them that the patient has, or is being evaluated for, COVID-19 infection. This will help the healthcare provider's office take steps to keep other people from getting infected. Ask the healthcare provider to call the local or state health department.  Limit the number of people who have contact with the patient  If possible, have only one caregiver for the patient.  Other household members should stay in another home or place of residence. If this is not possible, they should stay  in another room, or be separated from the patient as much as possible. Use a separate bathroom, if available.  Restrict visitors who do not have an essential need to be in the home.  Keep older adults, very young children, and other sick people away from the patient Keep older adults, very young children, and those who have compromised immune systems or chronic health conditions away from the patient. This includes people with chronic heart, lung, or kidney conditions, diabetes, and  cancer.  Ensure good ventilation Make sure that shared spaces in the home have good air flow, such as from an air conditioner or an opened window, weather permitting.  Wash your hands often  Wash your hands often and thoroughly with soap and water for at least 20 seconds. You can use an alcohol based hand sanitizer if soap and water are not available and if your hands are not visibly dirty.  Avoid touching your eyes, nose, and mouth with unwashed hands.  Use disposable paper towels to dry your hands. If not available, use dedicated cloth towels and replace them when they become wet.  Wear a facemask and gloves  Wear a disposable facemask at all times in the room and gloves when you touch or have contact with the patient's blood, body fluids, and/or secretions or excretions, such as sweat, saliva, sputum, nasal mucus, vomit, urine, or feces.  Ensure the mask fits over your nose and mouth tightly, and do not touch it during use.  Throw out disposable facemasks and gloves after using them. Do not reuse.  Wash your hands immediately after removing your facemask and gloves.  If your personal clothing becomes contaminated, carefully remove clothing and launder. Wash your hands after handling contaminated clothing.  Place all used disposable facemasks, gloves, and other waste  in a lined container before disposing them with other household waste.  Remove gloves and wash your hands immediately after handling these items.  Do not share dishes, glasses, or other household items with the patient  Avoid sharing household items. You should not share dishes, drinking glasses, cups, eating utensils, towels, bedding, or other items with a patient who is confirmed to have, or being evaluated for, COVID-19 infection.  After the person uses these items, you should wash them thoroughly with soap and water.  Wash laundry thoroughly  Immediately remove and wash clothes or bedding that have blood, body  fluids, and/or secretions or excretions, such as sweat, saliva, sputum, nasal mucus, vomit, urine, or feces, on them.  Wear gloves when handling laundry from the patient.  Read and follow directions on labels of laundry or clothing items and detergent. In general, wash and dry with the warmest temperatures recommended on the label.  Clean all areas the individual has used often  Clean all touchable surfaces, such as counters, tabletops, doorknobs, bathroom fixtures, toilets, phones, keyboards, tablets, and bedside tables, every day. Also, clean any surfaces that may have blood, body fluids, and/or secretions or excretions on them.  Wear gloves when cleaning surfaces the patient has come in contact with.  Use a diluted bleach solution (e.g., dilute bleach with 1 part bleach and 10 parts water) or a household disinfectant with a label that says EPA-registered for coronaviruses. To make a bleach solution at home, add 1 tablespoon of bleach to 1 quart (4 cups) of water. For a larger supply, add  cup of bleach to 1 gallon (16 cups) of water.  Read labels of cleaning products and follow recommendations provided on product labels. Labels contain instructions for safe and effective use of the cleaning product including precautions you should take when applying the product, such as wearing gloves or eye protection and making sure you have good ventilation during use of the product.  Remove gloves and wash hands immediately after cleaning.  Monitor yourself for signs and symptoms of illness Caregivers and household members are considered close contacts, should monitor their health, and will be asked to limit movement outside of the home to the extent possible. Follow the monitoring steps for close contacts listed on the symptom monitoring form.   ? If you have additional questions, contact your local health department or call the epidemiologist on call at [phone] (available 24/7). ? This  guidance is subject to change. For the most up-to-date guidance from [facility], please refer to their website: [email or web address].pl   Information on my medicine - ELIQUIS (apixaban)  This medication education was reviewed with me or my healthcare representative as part of my discharge preparation.    Why was Eliquis prescribed for you? Eliquis was prescribed for you to reduce the risk of a blood clot forming that can cause a stroke if you have a medical condition called atrial fibrillation (a type of irregular heartbeat).  What do You need to know about Eliquis ? Take your Eliquis TWICE DAILY - one tablet in the morning and one tablet in the evening with or without food. If you have difficulty swallowing the tablet whole please discuss with your pharmacist how to take the medication safely.  Take Eliquis exactly as prescribed by your doctor and DO NOT stop taking Eliquis without talking to the doctor who prescribed the medication.  Stopping may increase your risk of developing a stroke.  Refill your prescription before you run out.  After discharge, you  should have regular check-up appointments with your healthcare provider that is prescribing your Eliquis.  In the future your dose may need to be changed if your kidney function or weight changes by a significant amount or as you get older.  What do you do if you miss a dose? If you miss a dose, take it as soon as you remember on the same day and resume taking twice daily.  Do not take more than one dose of ELIQUIS at the same time to make up a missed dose.  Important Safety Information A possible side effect of Eliquis is bleeding. You should call your healthcare provider right away if you experience any of the following: ? Bleeding from an injury or your nose that does not stop. ? Unusual colored urine (red or dark brown) or unusual colored stools (red or black). ? Unusual bruising  for unknown reasons. ? A serious fall or if you hit your head (even if there is no bleeding).  Some medicines may interact with Eliquis and might increase your risk of bleeding or clotting while on Eliquis. To help avoid this, consult your healthcare provider or pharmacist prior to using any new prescription or non-prescription medications, including herbals, vitamins, non-steroidal anti-inflammatory drugs (NSAIDs) and supplements.  This website has more information on Eliquis (apixaban): http://www.eliquis.com/eliquis/home

## 2021-01-30 NOTE — Plan of Care (Signed)
  Problem: Education: Goal: Knowledge of General Education information will improve Description: Including pain rating scale, medication(s)/side effects and non-pharmacologic comfort measures Outcome: Progressing   Problem: Health Behavior/Discharge Planning: Goal: Ability to manage health-related needs will improve Outcome: Progressing   Problem: Clinical Measurements: Goal: Ability to maintain clinical measurements within normal limits will improve Outcome: Progressing Goal: Will remain free from infection Outcome: Progressing Goal: Diagnostic test results will improve Outcome: Progressing Goal: Respiratory complications will improve Outcome: Progressing Goal: Cardiovascular complication will be avoided Outcome: Progressing   Problem: Activity: Goal: Risk for activity intolerance will decrease Outcome: Progressing   Problem: Nutrition: Goal: Adequate nutrition will be maintained Outcome: Progressing   Problem: Coping: Goal: Level of anxiety will decrease Outcome: Progressing   Problem: Elimination: Goal: Will not experience complications related to bowel motility Outcome: Progressing Goal: Will not experience complications related to urinary retention Outcome: Progressing   Problem: Pain Managment: Goal: General experience of comfort will improve Outcome: Progressing   Problem: Skin Integrity: Goal: Risk for impaired skin integrity will decrease Outcome: Progressing   Problem: Education: Goal: Knowledge of risk factors and measures for prevention of condition will improve Outcome: Progressing   Problem: Coping: Goal: Psychosocial and spiritual needs will be supported Outcome: Progressing   Problem: Respiratory: Goal: Will maintain a patent airway Outcome: Progressing Goal: Complications related to the disease process, condition or treatment will be avoided or minimized Outcome: Progressing

## 2021-01-30 NOTE — Telephone Encounter (Signed)
He is in the hospital.  Algis Greenhouse. [name], MD 01/30/2021 9:31 AM

## 2021-01-30 NOTE — Plan of Care (Signed)

## 2021-01-30 NOTE — Progress Notes (Signed)
Patient's heart rate from low 30's to 40's when sleeping ; MD aware. Patient encourage to wear CPAP- declined. Stated "I feel fine and I will never wear that again".   Education provided, cont to decline.

## 2021-01-31 ENCOUNTER — Inpatient Hospital Stay (HOSPITAL_COMMUNITY): Payer: Medicare Other

## 2021-01-31 LAB — COMPREHENSIVE METABOLIC PANEL
ALT: 31 U/L (ref 0–44)
AST: 42 U/L — ABNORMAL HIGH (ref 15–41)
Albumin: 2.3 g/dL — ABNORMAL LOW (ref 3.5–5.0)
Alkaline Phosphatase: 69 U/L (ref 38–126)
Anion gap: 10 (ref 5–15)
BUN: 56 mg/dL — ABNORMAL HIGH (ref 8–23)
CO2: 22 mmol/L (ref 22–32)
Calcium: 8.8 mg/dL — ABNORMAL LOW (ref 8.9–10.3)
Chloride: 107 mmol/L (ref 98–111)
Creatinine, Ser: 1.58 mg/dL — ABNORMAL HIGH (ref 0.61–1.24)
GFR, Estimated: 44 mL/min — ABNORMAL LOW (ref >60)
Glucose, Bld: 200 mg/dL — ABNORMAL HIGH (ref 70–99)
Potassium: 4.4 mmol/L (ref 3.5–5.1)
Sodium: 139 mmol/L (ref 135–145)
Total Bilirubin: 0.6 mg/dL (ref 0.3–1.2)
Total Protein: 5.6 g/dL — ABNORMAL LOW (ref 6.5–8.1)

## 2021-01-31 LAB — CBC WITH DIFFERENTIAL/PLATELET
Abs Immature Granulocytes: 0.25 10*3/uL — ABNORMAL HIGH (ref 0.00–0.07)
Basophils Absolute: 0.1 10*3/uL (ref 0.0–0.1)
Basophils Relative: 0 %
Eosinophils Absolute: 0.1 10*3/uL (ref 0.0–0.5)
Eosinophils Relative: 0 %
HCT: 39 % (ref 39.0–52.0)
Hemoglobin: 13.4 g/dL (ref 13.0–17.0)
Immature Granulocytes: 1 %
Lymphocytes Relative: 3 %
Lymphs Abs: 0.7 10*3/uL (ref 0.7–4.0)
MCH: 31.5 pg (ref 26.0–34.0)
MCHC: 34.4 g/dL (ref 30.0–36.0)
MCV: 91.5 fL (ref 80.0–100.0)
Monocytes Absolute: 0.7 10*3/uL (ref 0.1–1.0)
Monocytes Relative: 3 %
Neutro Abs: 18.6 10*3/uL — ABNORMAL HIGH (ref 1.7–7.7)
Neutrophils Relative %: 93 %
Platelets: 290 10*3/uL (ref 150–400)
RBC: 4.26 MIL/uL (ref 4.22–5.81)
RDW: 13.7 % (ref 11.5–15.5)
WBC: 20.3 10*3/uL — ABNORMAL HIGH (ref 4.0–10.5)
nRBC: 0 % (ref 0.0–0.2)

## 2021-01-31 LAB — GLUCOSE, CAPILLARY
Glucose-Capillary: 165 mg/dL — ABNORMAL HIGH (ref 70–99)
Glucose-Capillary: 253 mg/dL — ABNORMAL HIGH (ref 70–99)
Glucose-Capillary: 187 mg/dL — ABNORMAL HIGH (ref 70–99)
Glucose-Capillary: 226 mg/dL — ABNORMAL HIGH (ref 70–99)

## 2021-01-31 LAB — C-REACTIVE PROTEIN: CRP: 1.7 mg/dL — ABNORMAL HIGH (ref <1.0)

## 2021-01-31 LAB — MAGNESIUM: Magnesium: 2.1 mg/dL (ref 1.7–2.4)

## 2021-01-31 LAB — D-DIMER, QUANTITATIVE: D-Dimer, Quant: 0.75 ug/mL-FEU — ABNORMAL HIGH (ref 0.00–0.50)

## 2021-01-31 LAB — PROCALCITONIN: Procalcitonin: <0.1 ng/mL

## 2021-01-31 NOTE — Plan of Care (Signed)
  Problem: Education: Goal: Knowledge of General Education information will improve Description: Including pain rating scale, medication(s)/side effects and non-pharmacologic comfort measures Outcome: Progressing   Problem: Health Behavior/Discharge Planning: Goal: Ability to manage health-related needs will improve Outcome: Progressing   Problem: Clinical Measurements: Goal: Ability to maintain clinical measurements within normal limits will improve Outcome: Progressing Goal: Will remain free from infection Outcome: Progressing Goal: Diagnostic test results will improve Outcome: Progressing Goal: Respiratory complications will improve Outcome: Progressing Goal: Cardiovascular complication will be avoided Outcome: Progressing   Problem: Activity: Goal: Risk for activity intolerance will decrease Outcome: Progressing   Problem: Nutrition: Goal: Adequate nutrition will be maintained Outcome: Progressing   Problem: Coping: Goal: Level of anxiety will decrease Outcome: Progressing   Problem: Elimination: Goal: Will not experience complications related to bowel motility Outcome: Progressing Goal: Will not experience complications related to urinary retention Outcome: Progressing   Problem: Safety: Goal: Ability to remain free from injury will improve Outcome: Progressing   Problem: Pain Managment: Goal: General experience of comfort will improve Outcome: Progressing   Problem: Skin Integrity: Goal: Risk for impaired skin integrity will decrease Outcome: Progressing   Problem: Education: Goal: Knowledge of risk factors and measures for prevention of condition will improve Outcome: Progressing   Problem: Coping: Goal: Psychosocial and spiritual needs will be supported Outcome: Progressing   Problem: Respiratory: Goal: Will maintain a patent airway Outcome: Progressing Goal: Complications related to the disease process, condition or treatment will be avoided or  minimized Outcome: Progressing

## 2021-01-31 NOTE — Telephone Encounter (Signed)
Left message to return call to our office at their convenience.  

## 2021-01-31 NOTE — Telephone Encounter (Signed)
I have reviewed his chart. I don't see where he had a stress test done but I do see where he had an EKG that was done by his urologist during his hospitalization. I am not sure why the results of that were never forwarded to [place] or a cardiologist to follow up on. We can discuss more at his hospital follow up visit for next steps but he needs to see a cardiologist.  [name]. [name], MD 01/31/2021 8:27 AM

## 2021-01-31 NOTE — Telephone Encounter (Signed)
Spoke with patient daughter, gave Dr [name] information She verbalized understanding, stated patient need CPAP machine due to sleep apnea Using one in the hospital, mask bother him and took it off due to been to big  She also agreed with cardiologist referral

## 2021-01-31 NOTE — Telephone Encounter (Signed)
See note

## 2021-01-31 NOTE — Telephone Encounter (Signed)
Ok to place referral to cardiology and sleep medicine if needed though we probably need to wait until he is discharged from the hospital.  Algis Greenhouse. [name], MD 01/31/2021 2:22 PM

## 2021-01-31 NOTE — Progress Notes (Addendum)
SATURATION QUALIFICATIONS: (This note is used to comply with regulatory documentation for home oxygen)  Patient Saturations on Room Air at Rest = 89%  Patient Saturations on Room Air while Ambulating = 86%  Patient Saturations on 3 Liters of oxygen while Ambulating = 93%

## 2021-01-31 NOTE — Progress Notes (Signed)
PROGRESS NOTE                                                                                                                                                                                                             Patient Demographics:    [name], is a 80 y.o. male, DOB - 10-07-1941, NOB:[id number]  Outpatient Primary MD for the patient is Vivi Barrack, MD    LOS - 3  Admit date - 01/28/2021    Chief Complaint  Patient presents with  . Covid Positive  . Weakness  . Cough       Brief Narrative (HPI from H&P)  - [name] is a 80 y.o. male with medical history significant of CKD stage III, HTN, IIDM, HLD, Guillain-Barr syndrome, prostate cancer status post radiation, presented with increasing shortness of breath and cough, he is unvaccinated for COVID-19, in the ER was diagnosed with acute hypoxic respiratory failure due to COVID-19 pneumonia.   Subjective:   Patient in bed, appears comfortable, denies any headache, no fever, no chest pain or pressure, no shortness of breath , no abdominal pain. No focal weakness.   Assessment  & Plan :     1. Acute Hypoxic Resp. Failure due to Acute Covid 19 Viral Pneumonitis during the ongoing 2020 Covid 19 Pandemic - he is not vaccinated for COVID-19, chose not to take the vaccine due to previous history of Gullian Barr syndrome.  Also does not want to try Remdesivir.  He has been treated with IV steroids along with Actemra on 01/29/2021, clinically good improvement, continue to monitor closely. Encouraged the patient to sit up in chair in the daytime use I-S and flutter valve for pulmonary toiletry.  Will advance activity and titrate down oxygen as possible.   Recent Labs  Lab 01/28/21 1157 01/28/21 1242 01/28/21 1252 01/28/21 1435 01/29/21 0236 01/30/21 0249 01/31/21 0140  WBC  --  7.4  --   --  5.4 20.4* 20.3*  HGB  --  15.6 15.6  --  14.2 13.9 13.4  HCT   --  46.0 46.0  --  41.1 39.9 39.0  PLT  --  187  --   --  205 260 290  CRP  --   --   --   --  13.7* 5.5* 1.7*  BNP  --   --   --   --  253.1*  --   --   DDIMER  --  1.93*  --   --  1.21* 0.83* 0.75*  PROCALCITON  --  <0.10  --   --  <0.10 <0.10 <0.10  AST  --  57*  --   --  47* 46* 42*  ALT  --  31  --   --  27 31 31   ALKPHOS  --  82  --   --  72 72 69  BILITOT  --  1.0  --   --  0.8 0.7 0.6  ALBUMIN  --  2.6*  --   --  2.3* 2.3* 2.3*  LATICACIDVEN  --  2.2*  --  2.3*  --   --   --   SARSCOV2NAA POSITIVE*  --   --   --   --   --   --    Lab Results  Component Value Date   TSH 0.455 01/30/2021     2.  A. fib with slow ventricular response.  Mali vas 2 score of 3.  Continue Eliquis along with telemetry monitoring.  He has chronic asymptomatic sinus bradycardia.  3.  AKI on CKD stage IIIa.  Baseline creatinine around 1.5. Hydrated will Monitor.  4.  BPH.  On Flomax, stable UA.   5.  Dyslipidemia.  On statin.  6.  Asymptomatic sinus bradycardia with likely sleep apnea at night.  Refuses to try CPAP, avoid AV node blocking agents, blood pressure stable and patient asymptomatic, outpatient cardiology follow-up.   7.  DM type II.  Will place on sliding scale and monitor.  Lab Results  Component Value Date   HGBA1C 6.1 (A) 12/07/2020   CBG (last 3)  Recent Labs    01/30/21 1756 01/30/21 2006 01/31/21 0803  GLUCAP 170* 193* 165*         Condition - Extremely Guarded  Family Communication  : Listed contact of wife is deceased.  Code Status : Full  Consults  :  None  PUD Prophylaxis : None   Procedures  :     CTA - no PE, multifocal pneumonia bilateral.      Disposition Plan  :    Status is: Inpatient  Remains inpatient appropriate because:IV treatments appropriate due to intensity of illness or inability to take PO   Dispo: The patient is from: Home              Anticipated d/c is to: Home              Patient currently is not medically stable to  d/c.   Difficult to place patient No  DVT Prophylaxis  :  Eliquis  Lab Results  Component Value Date   PLT 290 01/31/2021    Diet :  Diet Order            Diet heart healthy/carb modified Room service appropriate? Yes; Fluid consistency: Thin  Diet effective now                  Inpatient Medications  Scheduled Meds: . apixaban  5 mg Oral BID  . aspirin EC  81 mg Oral Daily  . atorvastatin  80 mg Oral Daily  . insulin aspart  0-5 Units Subcutaneous QHS  . insulin aspart  0-9 Units Subcutaneous TID WC  . methylPREDNISolone (SOLU-MEDROL) injection  60 mg Intravenous BID  . tamsulosin  0.4 mg Oral QHS  . valACYclovir  500 mg Oral Daily  Continuous Infusions:  PRN Meds:.acetaminophen, benzonatate, diazepam, fluticasone, guaiFENesin-dextromethorphan, hydrALAZINE, Ipratropium-Albuterol, [DISCONTINUED] ondansetron **OR** ondansetron (ZOFRAN) IV  Antibiotics  :    Anti-infectives (From admission, onward)   Start     Dose/Rate Route Frequency Ordered Stop   01/29/21 1045  valACYclovir (VALTREX) tablet 500 mg        500 mg Oral Daily 01/29/21 0956         Time Spent in minutes  30   Lala Lund M.D on 01/31/2021 at 11:40 AM  To page go to [email or web address]   [facility] -  Office  [phone]     See all Orders from today for further details    Objective:   Vitals:   01/30/21 1144 01/30/21 2020 01/31/21 0035 01/31/21 0400  BP: 122/65 (!) 147/101 138/82 (!) 131/55  Pulse: 67 72  62  Resp: 20 20 16 20   Temp: 97.6 F (36.4 C) 98 F (36.7 C) 98.2 F (36.8 C) 98.5 F (36.9 C)  TempSrc: Oral Oral Oral Oral  SpO2: 91% 93% 97% 92%  Weight:      Height:        Wt Readings from Last 3 Encounters:  01/29/21 80.2 kg  01/24/21 81.6 kg  12/07/20 84.3 kg     Intake/Output Summary (Last 24 hours) at 01/31/2021 1140 Last data filed at 01/31/2021 0600 Gross per 24 hour  Intake 640 ml  Output 1050 ml  Net -410 ml     Physical Exam  Awake Alert,  No new F.N deficits, Normal affect [place].AT,PERRAL Supple Neck,No JVD, No cervical lymphadenopathy appriciated.  Symmetrical Chest wall movement, Good air movement bilaterally, CTAB RRR,No Gallops, Rubs or new Murmurs, No Parasternal Heave +ve B.Sounds, Abd Soft, No tenderness, No organomegaly appriciated, No rebound - guarding or rigidity. No Cyanosis, Clubbing or edema, No new Rash or bruise      Data Review:    CBC Recent Labs  Lab 01/28/21 1242 01/28/21 1252 01/29/21 0236 01/30/21 0249 01/31/21 0140  WBC 7.4  --  5.4 20.4* 20.3*  HGB 15.6 15.6 14.2 13.9 13.4  HCT 46.0 46.0 41.1 39.9 39.0  PLT 187  --  205 260 290  MCV 90.6  --  89.0 89.1 91.5  MCH 30.7  --  30.7 31.0 31.5  MCHC 33.9  --  34.5 34.8 34.4  RDW 13.7  --  13.7 13.6 13.7  LYMPHSABS 0.3*  --  0.4* 0.8 0.7  MONOABS 0.7  --  0.2 0.9 0.7  EOSABS 0.0  --  0.0 0.1 0.1  BASOSABS 0.0  --  0.0 0.0 0.1    Recent Labs  Lab 01/28/21 1242 01/28/21 1252 01/28/21 1435 01/29/21 0236 01/30/21 0249 01/30/21 1019 01/31/21 0140  NA 134* 135  --  136 137  --  139  K 3.8 3.9  --  3.8 3.6  --  4.4  CL 100  --   --  103 107  --  107  CO2 22  --   --  23 21*  --  22  GLUCOSE 249*  --   --  191* 180*  --  200*  BUN 36*  --   --  44* 57*  --  56*  CREATININE 1.88*  --   --  1.64* 1.65*  --  1.58*  CALCIUM 8.7*  --   --  8.5* 8.8*  --  8.8*  AST 57*  --   --  47* 46*  --  42*  ALT 31  --   --  27 31  --  31  ALKPHOS 82  --   --  72 72  --  69  BILITOT 1.0  --   --  0.8 0.7  --  0.6  ALBUMIN 2.6*  --   --  2.3* 2.3*  --  2.3*  MG  --   --   --  2.0 2.0  --  2.1  CRP  --   --   --  13.7* 5.5*  --  1.7*  DDIMER 1.93*  --   --  1.21* 0.83*  --  0.75*  PROCALCITON <0.10  --   --  <0.10 <0.10  --  <0.10  LATICACIDVEN 2.2*  --  2.3*  --   --   --   --   TSH  --   --   --   --   --  0.455  --   BNP  --   --   --  253.1*  --   --   --      ------------------------------------------------------------------------------------------------------------------ No results for input(s): CHOL, HDL, LDLCALC, TRIG, CHOLHDL, LDLDIRECT in the last 72 hours.  Lab Results  Component Value Date   HGBA1C 6.1 (A) 12/07/2020   ------------------------------------------------------------------------------------------------------------------ Recent Labs    01/30/21 1019  TSH 0.455    Cardiac Enzymes No results for input(s): CKMB, TROPONINI, MYOGLOBIN in the last 168 hours.  Invalid input(s): CK ------------------------------------------------------------------------------------------------------------------    Component Value Date/Time   BNP 253.1 (H) 01/29/2021 0236    Micro Results Recent Results (from the past 240 hour(s))  SARS CORONAVIRUS 2 (TAT 6-24 HRS) Nasopharyngeal Nasopharyngeal Swab     Status: Abnormal   Collection Time: 01/28/21 11:57 AM   Specimen: Nasopharyngeal Swab  Result Value Ref Range Status   SARS Coronavirus 2 POSITIVE (A) NEGATIVE Final    Comment: (NOTE) SARS-CoV-2 target nucleic acids are DETECTED.  The SARS-CoV-2 RNA is generally detectable in upper and lower respiratory specimens during the acute phase of infection. Positive results are indicative of the presence of SARS-CoV-2 RNA. Clinical correlation with patient history and other diagnostic information is  necessary to determine patient infection status. Positive results do not rule out bacterial infection or co-infection with other viruses.  The expected result is Negative.  Fact Sheet for Patients: SugarRoll.be  Fact Sheet for Healthcare Providers: https://www.woods-mathews.com/  This test is not yet approved or cleared by the [place] FDA and  has been authorized for detection and/or diagnosis of SARS-CoV-2 by FDA under an Emergency Use Authorization (EUA). This EUA will remain  in effect  (meaning this test can be used) for the duration of the COVID-19 declaration under Section 564(b)(1) of the Act, 21 U. S.C. section 360bbb-3(b)(1), unless the authorization is terminated or revoked sooner.   Performed at [facility], [place] [address] [place], [place] [address]   Culture, Urine     Status: None   Collection Time: 01/29/21  9:13 AM   Specimen: Urine, Random  Result Value Ref Range Status   Specimen Description URINE, RANDOM  Final   Special Requests NONE  Final   Culture   Final    NO GROWTH Performed at [facility], [place] [address] [place], [place] [address]    Report Status 01/30/2021 FINAL  Final    Radiology Reports CT Angio Chest PE W/Cm &/Or Wo Cm  Result Date: 01/28/2021 CLINICAL DATA:  Elevated D-dimer, COVID-19 positive, increased coughing since Monday. History of NASH, stage III chronic  kidney disease, Guillain-Barre syndrome, hypertension, prostate cancer, type II diabetes mellitus EXAM: CT ANGIOGRAPHY CHEST WITH CONTRAST TECHNIQUE: Multidetector CT imaging of the chest was performed using the standard protocol during bolus administration of intravenous contrast. Multiplanar CT image reconstructions and MIPs were obtained to evaluate the vascular anatomy. CONTRAST:  52m OMNIPAQUE IOHEXOL 350 MG/ML SOLN IV COMPARISON:  None FINDINGS: Cardiovascular: Atherosclerotic calcifications aorta, proximal great vessels, and minimally in coronary arteries. Aorta normal caliber without aneurysm. Heart unremarkable. No pericardial effusion. Pulmonary arteries adequately opacified and patent. No evidence of pulmonary embolism. Mediastinum/Nodes: Esophagus unremarkable. Base of cervical region normal appearance. No thoracic adenopathy. Lungs/Pleura: Patchy BILATERAL pulmonary infiltrates consistent with multifocal pneumonia and COVID-19. No pleural effusion or pneumothorax. Upper Abdomen: Visualized liver and spleen unremarkable Musculoskeletal: Unremarkable Review of  the MIP images confirms the above findings. IMPRESSION: No evidence of pulmonary embolism. Patchy BILATERAL pulmonary infiltrates consistent with multifocal pneumonia and COVID-19. Aortic Atherosclerosis (ICD10-I70.0). Electronically Signed   By: MLavonia DanaM.D.   On: 01/28/2021 17:01   DG Chest Port 1 View  Result Date: 01/29/2021 CLINICAL DATA:  Shortness of breath.  COVID positive EXAM: PORTABLE CHEST 1 VIEW COMPARISON:  Yesterday FINDINGS: Mild progression of bilateral patchy infiltrate especially along the right minor fissure. No edema, effusion, or pneumothorax. Normal heart size. IMPRESSION: Atypical pneumonia with mild progression from yesterday. Electronically Signed   By: JMonte FantasiaM.D.   On: 01/29/2021 08:21   Portable chest 1 View  Result Date: 01/28/2021 CLINICAL DATA:  Shortness of breath.  COVID positive. EXAM: PORTABLE CHEST 1 VIEW COMPARISON:  None. FINDINGS: Normal heart size. No pleural effusion. Right upper lobe, left mid and left lower lobe interstitial and airspace densities are identified compatible with history of COVID. No pleural effusion. The visualized osseous structures are unremarkable. IMPRESSION: Bilateral pulmonary opacities compatible with history of COVID-19 infection. Electronically Signed   By: TKerby MoorsM.D.   On: 01/28/2021 12:39

## 2021-02-01 LAB — CBC WITH DIFFERENTIAL/PLATELET
Abs Immature Granulocytes: 0.3 10*3/uL — ABNORMAL HIGH (ref 0.00–0.07)
Basophils Absolute: 0 10*3/uL (ref 0.0–0.1)
Basophils Relative: 0 %
Eosinophils Absolute: 0 10*3/uL (ref 0.0–0.5)
Eosinophils Relative: 0 %
HCT: 39 % (ref 39.0–52.0)
Hemoglobin: 13.5 g/dL (ref 13.0–17.0)
Immature Granulocytes: 2 %
Lymphocytes Relative: 3 %
Lymphs Abs: 0.5 10*3/uL — ABNORMAL LOW (ref 0.7–4.0)
MCH: 30.8 pg (ref 26.0–34.0)
MCHC: 34.6 g/dL (ref 30.0–36.0)
MCV: 89 fL (ref 80.0–100.0)
Monocytes Absolute: 0.7 10*3/uL (ref 0.1–1.0)
Monocytes Relative: 4 %
Neutro Abs: 14.9 10*3/uL — ABNORMAL HIGH (ref 1.7–7.7)
Neutrophils Relative %: 91 %
Platelets: 291 10*3/uL (ref 150–400)
RBC: 4.38 MIL/uL (ref 4.22–5.81)
RDW: 13.5 % (ref 11.5–15.5)
WBC: 16.4 10*3/uL — ABNORMAL HIGH (ref 4.0–10.5)
nRBC: 0 % (ref 0.0–0.2)

## 2021-02-01 LAB — COMPREHENSIVE METABOLIC PANEL
ALT: 35 U/L (ref 0–44)
AST: 42 U/L — ABNORMAL HIGH (ref 15–41)
Albumin: 2.3 g/dL — ABNORMAL LOW (ref 3.5–5.0)
Alkaline Phosphatase: 69 U/L (ref 38–126)
Anion gap: 9 (ref 5–15)
BUN: 54 mg/dL — ABNORMAL HIGH (ref 8–23)
CO2: 23 mmol/L (ref 22–32)
Calcium: 8.8 mg/dL — ABNORMAL LOW (ref 8.9–10.3)
Chloride: 108 mmol/L (ref 98–111)
Creatinine, Ser: 1.48 mg/dL — ABNORMAL HIGH (ref 0.61–1.24)
GFR, Estimated: 48 mL/min — ABNORMAL LOW (ref >60)
Glucose, Bld: 164 mg/dL — ABNORMAL HIGH (ref 70–99)
Potassium: 4.1 mmol/L (ref 3.5–5.1)
Sodium: 140 mmol/L (ref 135–145)
Total Bilirubin: 1 mg/dL (ref 0.3–1.2)
Total Protein: 5.6 g/dL — ABNORMAL LOW (ref 6.5–8.1)

## 2021-02-01 LAB — PROCALCITONIN: Procalcitonin: 0.12 ng/mL

## 2021-02-01 LAB — GLUCOSE, CAPILLARY
Glucose-Capillary: 166 mg/dL — ABNORMAL HIGH (ref 70–99)
Glucose-Capillary: 219 mg/dL — ABNORMAL HIGH (ref 70–99)
Glucose-Capillary: 173 mg/dL — ABNORMAL HIGH (ref 70–99)
Glucose-Capillary: 212 mg/dL — ABNORMAL HIGH (ref 70–99)

## 2021-02-01 LAB — MAGNESIUM: Magnesium: 2.1 mg/dL (ref 1.7–2.4)

## 2021-02-01 LAB — C-REACTIVE PROTEIN: CRP: 0.9 mg/dL (ref <1.0)

## 2021-02-01 LAB — D-DIMER, QUANTITATIVE: D-Dimer, Quant: 1.11 ug/mL-FEU — ABNORMAL HIGH (ref 0.00–0.50)

## 2021-02-01 NOTE — Progress Notes (Signed)
PROGRESS NOTE                                                                                                                                                                                                             Patient Demographics:    [name], is a 80 y.o. male, DOB - 1941/03/11, HEK:[id number]  Outpatient Primary MD for the patient is Vivi Barrack, MD    LOS - 4  Admit date - 01/28/2021    Chief Complaint  Patient presents with  . Covid Positive  . Weakness  . Cough       Brief Narrative (HPI from H&P)  - [name] is a 80 y.o. male with medical history significant of CKD stage III, HTN, IIDM, HLD, Guillain-Barr syndrome, prostate cancer status post radiation, presented with increasing shortness of breath and cough, he is unvaccinated for COVID-19, in the ER was diagnosed with acute hypoxic respiratory failure due to COVID-19 pneumonia.   Subjective:   Patient in bed, appears comfortable, denies any headache, no fever, no chest pain or pressure, improved shortness of breath , no abdominal pain. No focal weakness.   Assessment  & Plan :     1. Acute Hypoxic Resp. Failure due to Acute Covid 19 Viral Pneumonitis during the ongoing 2020 Covid 19 Pandemic - he is not vaccinated for COVID-19, chose not to take the vaccine due to previous history of Gullian Barr syndrome.  Also does not want to try Remdesivir.  He has been treated with IV steroids along with Actemra on 01/29/2021, clinically good improvement, continue to monitor closely. Encouraged the patient to sit up in chair in the daytime use I-S and flutter valve for pulmonary toiletry.  Will advance activity and titrate down oxygen as possible.   Recent Labs  Lab 01/28/21 1157 01/28/21 1242 01/28/21 1242 01/28/21 1252 01/28/21 1435 01/29/21 0236 01/30/21 0249 01/31/21 0140 02/01/21 0124  WBC  --  7.4  --   --   --  5.4 20.4* 20.3* 16.4*   HGB  --  15.6   < > 15.6  --  14.2 13.9 13.4 13.5  HCT  --  46.0   < > 46.0  --  41.1 39.9 39.0 39.0  PLT  --  187  --   --   --  205 260 290  291  CRP  --   --   --   --   --  13.7* 5.5* 1.7* 0.9  BNP  --   --   --   --   --  253.1*  --   --   --   DDIMER  --  1.93*  --   --   --  1.21* 0.83* 0.75* 1.11*  PROCALCITON  --  <0.10  --   --   --  <0.10 <0.10 <0.10 0.12  AST  --  57*  --   --   --  47* 46* 42* 42*  ALT  --  31  --   --   --  27 31 31  35  ALKPHOS  --  82  --   --   --  72 72 69 69  BILITOT  --  1.0  --   --   --  0.8 0.7 0.6 1.0  ALBUMIN  --  2.6*  --   --   --  2.3* 2.3* 2.3* 2.3*  LATICACIDVEN  --  2.2*  --   --  2.3*  --   --   --   --   SARSCOV2NAA POSITIVE*  --   --   --   --   --   --   --   --    < > = values in this interval not displayed.   Lab Results  Component Value Date   TSH 0.455 01/30/2021     2.  A. fib with slow ventricular response.  Mali vas 2 score of 3.  Continue Eliquis along with telemetry monitoring.  He has chronic asymptomatic sinus bradycardia.  3.  AKI on CKD stage IIIa.  Baseline creatinine around 1.5. Hydrated will Monitor.  4.  BPH.  On Flomax, stable UA.   5.  Dyslipidemia.  On statin.  6.  Asymptomatic sinus bradycardia with likely sleep apnea at night.  Refuses to try CPAP, avoid AV node blocking agents, blood pressure stable and patient asymptomatic, outpatient cardiology follow-up.   7.  DM type II.  Will place on sliding scale and monitor.  Lab Results  Component Value Date   HGBA1C 6.1 (A) 12/07/2020   CBG (last 3)  Recent Labs    01/31/21 1731 01/31/21 2045 02/01/21 0736  GLUCAP 187* 226* 166*         Condition - Extremely Guarded  Family Communication  : Listed contact of wife is deceased.  Code Status : Full  Consults  :  None  PUD Prophylaxis : None   Procedures  :     CTA - no PE, multifocal pneumonia bilateral.      Disposition Plan  :    Status is: Inpatient  Remains inpatient appropriate  because:IV treatments appropriate due to intensity of illness or inability to take PO   Dispo: The patient is from: Home              Anticipated d/c is to: Home              Patient currently is not medically stable to d/c.   Difficult to place patient No  DVT Prophylaxis  :  Eliquis  Lab Results  Component Value Date   PLT 291 02/01/2021    Diet :  Diet Order            Diet heart healthy/carb modified Room service appropriate? Yes; Fluid consistency: Thin  Diet effective now  Inpatient Medications  Scheduled Meds: . apixaban  5 mg Oral BID  . aspirin EC  81 mg Oral Daily  . atorvastatin  80 mg Oral Daily  . insulin aspart  0-5 Units Subcutaneous QHS  . insulin aspart  0-9 Units Subcutaneous TID WC  . methylPREDNISolone (SOLU-MEDROL) injection  60 mg Intravenous BID  . tamsulosin  0.4 mg Oral QHS  . valACYclovir  500 mg Oral Daily   Continuous Infusions:  PRN Meds:.acetaminophen, benzonatate, diazepam, fluticasone, guaiFENesin-dextromethorphan, hydrALAZINE, Ipratropium-Albuterol, [DISCONTINUED] ondansetron **OR** ondansetron (ZOFRAN) IV  Antibiotics  :    Anti-infectives (From admission, onward)   Start     Dose/Rate Route Frequency Ordered Stop   01/29/21 1045  valACYclovir (VALTREX) tablet 500 mg        500 mg Oral Daily 01/29/21 0956         Time Spent in minutes  30   Lala Lund M.D on 02/01/2021 at 10:32 AM  To page go to [email or web address]   [facility] -  Office  [phone]     See all Orders from today for further details    Objective:   Vitals:   01/31/21 1830 01/31/21 2046 02/01/21 0400 02/01/21 0421  BP:  129/83  (!) 148/98  Pulse:  62  (!) 45  Resp:  20    Temp:  97.7 F (36.5 C) 97.9 F (36.6 C) 97.9 F (36.6 C)  TempSrc:  Oral Oral Oral  SpO2: 97% 92%    Weight:      Height:        Wt Readings from Last 3 Encounters:  01/29/21 80.2 kg  01/24/21 81.6 kg  12/07/20 84.3 kg     Intake/Output  Summary (Last 24 hours) at 02/01/2021 1032 Last data filed at 02/01/2021 0730 Gross per 24 hour  Intake 320 ml  Output 1350 ml  Net -1030 ml     Physical Exam  Awake Alert, No new F.N deficits, Normal affect [place].AT,PERRAL Supple Neck,No JVD, No cervical lymphadenopathy appriciated.  Symmetrical Chest wall movement, Good air movement bilaterally, CTAB RRR,No Gallops, Rubs or new Murmurs, No Parasternal Heave +ve B.Sounds, Abd Soft, No tenderness, No organomegaly appriciated, No rebound - guarding or rigidity. No Cyanosis, Clubbing or edema, No new Rash or bruise    Data Review:    CBC Recent Labs  Lab 01/28/21 1242 01/28/21 1252 01/29/21 0236 01/30/21 0249 01/31/21 0140 02/01/21 0124  WBC 7.4  --  5.4 20.4* 20.3* 16.4*  HGB 15.6 15.6 14.2 13.9 13.4 13.5  HCT 46.0 46.0 41.1 39.9 39.0 39.0  PLT 187  --  205 260 290 291  MCV 90.6  --  89.0 89.1 91.5 89.0  MCH 30.7  --  30.7 31.0 31.5 30.8  MCHC 33.9  --  34.5 34.8 34.4 34.6  RDW 13.7  --  13.7 13.6 13.7 13.5  LYMPHSABS 0.3*  --  0.4* 0.8 0.7 0.5*  MONOABS 0.7  --  0.2 0.9 0.7 0.7  EOSABS 0.0  --  0.0 0.1 0.1 0.0  BASOSABS 0.0  --  0.0 0.0 0.1 0.0    Recent Labs  Lab 01/28/21 1242 01/28/21 1252 01/28/21 1435 01/29/21 0236 01/30/21 0249 01/30/21 1019 01/31/21 0140 02/01/21 0124  NA 134* 135  --  136 137  --  139 140  K 3.8 3.9  --  3.8 3.6  --  4.4 4.1  CL 100  --   --  103 107  --  107 108  CO2 22  --   --  23 21*  --  22 23  GLUCOSE 249*  --   --  191* 180*  --  200* 164*  BUN 36*  --   --  44* 57*  --  56* 54*  CREATININE 1.88*  --   --  1.64* 1.65*  --  1.58* 1.48*  CALCIUM 8.7*  --   --  8.5* 8.8*  --  8.8* 8.8*  AST 57*  --   --  47* 46*  --  42* 42*  ALT 31  --   --  27 31  --  31 35  ALKPHOS 82  --   --  72 72  --  69 69  BILITOT 1.0  --   --  0.8 0.7  --  0.6 1.0  ALBUMIN 2.6*  --   --  2.3* 2.3*  --  2.3* 2.3*  MG  --   --   --  2.0 2.0  --  2.1 2.1  CRP  --   --   --  13.7* 5.5*  --  1.7* 0.9   DDIMER 1.93*  --   --  1.21* 0.83*  --  0.75* 1.11*  PROCALCITON <0.10  --   --  <0.10 <0.10  --  <0.10 0.12  LATICACIDVEN 2.2*  --  2.3*  --   --   --   --   --   TSH  --   --   --   --   --  0.455  --   --   BNP  --   --   --  253.1*  --   --   --   --     ------------------------------------------------------------------------------------------------------------------ No results for input(s): CHOL, HDL, LDLCALC, TRIG, CHOLHDL, LDLDIRECT in the last 72 hours.  Lab Results  Component Value Date   HGBA1C 6.1 (A) 12/07/2020   ------------------------------------------------------------------------------------------------------------------ Recent Labs    01/30/21 1019  TSH 0.455    Cardiac Enzymes No results for input(s): CKMB, TROPONINI, MYOGLOBIN in the last 168 hours.  Invalid input(s): CK ------------------------------------------------------------------------------------------------------------------    Component Value Date/Time   BNP 253.1 (H) 01/29/2021 0236    Micro Results Recent Results (from the past 240 hour(s))  SARS CORONAVIRUS 2 (TAT 6-24 HRS) Nasopharyngeal Nasopharyngeal Swab     Status: Abnormal   Collection Time: 01/28/21 11:57 AM   Specimen: Nasopharyngeal Swab  Result Value Ref Range Status   SARS Coronavirus 2 POSITIVE (A) NEGATIVE Final    Comment: (NOTE) SARS-CoV-2 target nucleic acids are DETECTED.  The SARS-CoV-2 RNA is generally detectable in upper and lower respiratory specimens during the acute phase of infection. Positive results are indicative of the presence of SARS-CoV-2 RNA. Clinical correlation with patient history and other diagnostic information is  necessary to determine patient infection status. Positive results do not rule out bacterial infection or co-infection with other viruses.  The expected result is Negative.  Fact Sheet for Patients: SugarRoll.be  Fact Sheet for Healthcare  Providers: https://www.woods-mathews.com/  This test is not yet approved or cleared by the [place] FDA and  has been authorized for detection and/or diagnosis of SARS-CoV-2 by FDA under an Emergency Use Authorization (EUA). This EUA will remain  in effect (meaning this test can be used) for the duration of the COVID-19 declaration under Section 564(b)(1) of the Act, 21 U. S.C. section 360bbb-3(b)(1), unless the authorization is terminated or revoked sooner.   Performed at [facility], [place],  [place] [address]   Culture, Urine     Status: None   Collection Time: 01/29/21  9:13 AM   Specimen: Urine, Random  Result Value Ref Range Status   Specimen Description URINE, RANDOM  Final   Special Requests NONE  Final   Culture   Final    NO GROWTH Performed at [facility], [place] [address], [place], [place] [address]    Report Status 01/30/2021 FINAL  Final    Radiology Reports CT Angio Chest PE W/Cm &/Or Wo Cm  Result Date: 01/28/2021 CLINICAL DATA:  Elevated D-dimer, COVID-19 positive, increased coughing since Monday. History of NASH, stage III chronic kidney disease, Guillain-Barre syndrome, hypertension, prostate cancer, type II diabetes mellitus EXAM: CT ANGIOGRAPHY CHEST WITH CONTRAST TECHNIQUE: Multidetector CT imaging of the chest was performed using the standard protocol during bolus administration of intravenous contrast. Multiplanar CT image reconstructions and MIPs were obtained to evaluate the vascular anatomy. CONTRAST:  58m OMNIPAQUE IOHEXOL 350 MG/ML SOLN IV COMPARISON:  None FINDINGS: Cardiovascular: Atherosclerotic calcifications aorta, proximal great vessels, and minimally in coronary arteries. Aorta normal caliber without aneurysm. Heart unremarkable. No pericardial effusion. Pulmonary arteries adequately opacified and patent. No evidence of pulmonary embolism. Mediastinum/Nodes: Esophagus unremarkable. Base of cervical region  normal appearance. No thoracic adenopathy. Lungs/Pleura: Patchy BILATERAL pulmonary infiltrates consistent with multifocal pneumonia and COVID-19. No pleural effusion or pneumothorax. Upper Abdomen: Visualized liver and spleen unremarkable Musculoskeletal: Unremarkable Review of the MIP images confirms the above findings. IMPRESSION: No evidence of pulmonary embolism. Patchy BILATERAL pulmonary infiltrates consistent with multifocal pneumonia and COVID-19. Aortic Atherosclerosis (ICD10-I70.0). Electronically Signed   By: MLavonia DanaM.D.   On: 01/28/2021 17:01   DG Chest Port 1 View  Result Date: 01/31/2021 CLINICAL DATA:  COVID positive EXAM: PORTABLE CHEST 1 VIEW COMPARISON:  01/29/2021 FINDINGS: Bilateral interstitial and patchy alveolar airspace opacities consistent with atypical viral pneumonia. No pleural effusion or pneumothorax. Stable cardiomediastinal silhouette. No aggressive osseous lesion. IMPRESSION: 1. Bilateral interstitial and patchy alveolar airspace opacities consistent with atypical viral pneumonia. Electronically Signed   By: HKathreen Devoid  On: 01/31/2021 15:05   DG Chest Port 1 View  Result Date: 01/29/2021 CLINICAL DATA:  Shortness of breath.  COVID positive EXAM: PORTABLE CHEST 1 VIEW COMPARISON:  Yesterday FINDINGS: Mild progression of bilateral patchy infiltrate especially along the right minor fissure. No edema, effusion, or pneumothorax. Normal heart size. IMPRESSION: Atypical pneumonia with mild progression from yesterday. Electronically Signed   By: JMonte FantasiaM.D.   On: 01/29/2021 08:21   Portable chest 1 View  Result Date: 01/28/2021 CLINICAL DATA:  Shortness of breath.  COVID positive. EXAM: PORTABLE CHEST 1 VIEW COMPARISON:  None. FINDINGS: Normal heart size. No pleural effusion. Right upper lobe, left mid and left lower lobe interstitial and airspace densities are identified compatible with history of COVID. No pleural effusion. The visualized osseous structures  are unremarkable. IMPRESSION: Bilateral pulmonary opacities compatible with history of COVID-19 infection. Electronically Signed   By: TKerby MoorsM.D.   On: 01/28/2021 12:39

## 2021-02-01 NOTE — Plan of Care (Signed)
  Problem: Health Behavior/Discharge Planning: Goal: Ability to manage health-related needs will improve Outcome: Progressing   Problem: Clinical Measurements: Goal: Will remain free from infection Outcome: Progressing Goal: Respiratory complications will improve Outcome: Progressing   Problem: Activity: Goal: Risk for activity intolerance will decrease Outcome: Progressing   Problem: Safety: Goal: Ability to remain free from injury will improve Outcome: Progressing   Problem: Education: Goal: Knowledge of risk factors and measures for prevention of condition will improve Outcome: Progressing   Problem: Coping: Goal: Psychosocial and spiritual needs will be supported Outcome: Progressing   Problem: Respiratory: Goal: Will maintain a patent airway Outcome: Progressing Goal: Complications related to the disease process, condition or treatment will be avoided or minimized Outcome: Progressing

## 2021-02-01 NOTE — Care Management Important Message (Signed)
Important Message  Patient Details  Name: [name] MRN: [id number] Date of Birth: 14-May-1941   Medicare Important Message Given:  Yes - Important Message mailed due to current National Emergency   Verbal consent obtained due to current National Emergency  Relationship to patient: Self Contact Name: Wilkes Call Date: 02/01/21  Time: 1450 Phone: [phone] Outcome: No Answer/Busy Important Message mailed to: Patient address on file    Delorse Lek 02/01/2021, 2:50 PM

## 2021-02-02 ENCOUNTER — Other Ambulatory Visit: Payer: Self-pay | Admitting: *Deleted

## 2021-02-02 ENCOUNTER — Other Ambulatory Visit: Payer: Self-pay | Admitting: Internal Medicine

## 2021-02-02 DIAGNOSIS — G4730 Sleep apnea, unspecified: Secondary | ICD-10-CM

## 2021-02-02 DIAGNOSIS — R0683 Snoring: Secondary | ICD-10-CM

## 2021-02-02 DIAGNOSIS — I4891 Unspecified atrial fibrillation: Secondary | ICD-10-CM

## 2021-02-02 LAB — GLUCOSE, CAPILLARY
Glucose-Capillary: 166 mg/dL — ABNORMAL HIGH (ref 70–99)
Glucose-Capillary: 174 mg/dL — ABNORMAL HIGH (ref 70–99)

## 2021-02-02 MED ORDER — METHYLPREDNISOLONE 4 MG PO TBPK: ORAL_TABLET | ORAL | 0 refills | Status: DC

## 2021-02-02 MED ORDER — APIXABAN 5 MG PO TABS: 5.0000 mg | ORAL_TABLET | Freq: Two times a day (BID) | ORAL | 0 refills | Status: DC

## 2021-02-02 MED FILL — methylPREDNISolone 4 MG dos: 4 | 6 days supply | Qty: 21 | Fill #0

## 2021-02-02 MED FILL — ELIQUIS 5 MG TABLET: 5 | 30 days supply | Qty: 60 | Fill #0

## 2021-02-02 NOTE — TOC Transition Note (Signed)
Transition of Care [facility]) - CM/SW Discharge Note   Patient Details  Name: [name] MRN: [id number] Date of Birth: [birth year]  [facility] CM/SW Contact:  Ninfa Meeker, RN Phone Number: 02/02/2021, 12:15 PM   Clinical Narrative:  Case manage spoke with patient's daughter concerning her dad's need for oxygen. CM confirmed patient's home address, he is listed with a PO box in demographics. Home address is [address], [place], [place]. Referral called to [employer], they will deliver tank to patient's room, no further CM needs identified.      Final next level of care: Home/Self Care Barriers to Discharge: No Barriers Identified   Patient Goals and CMS Choice        Discharge Placement                       Discharge Plan and Services In-house Referral: NA Discharge Planning Services: CM Consult Post Acute Care Choice: Durable Medical Equipment          DME Arranged: Oxygen DME Agency: [employer] Date DME Agency Contacted: 02/02/21 Time DME Agency Contacted: 1209 Representative spoke with at DME Agency: Freda Munro [facility] Arranged: NA          Social Determinants of Health ([place]) Interventions     Readmission Risk Interventions No flowsheet data found.

## 2021-02-02 NOTE — Discharge Summary (Signed)
Ladarrell Cornwall YOM:[id number] DOB: 1941-01-28 DOA: 01/28/2021  PCP: Vivi Barrack, MD  Admit date: 01/28/2021  Discharge date: 02/02/2021  Admitted From:Home   Disposition:  Home   Recommendations for Outpatient Follow-up:   Follow up with PCP in 1-2 weeks  PCP Please obtain BMP/CBC, 2 view CXR in 1week,  (see Discharge instructions)   PCP Please follow up on the following pending results: Check CBC, CMP and chest x-ray in 7 to 10 days, arrange for outpatient cardiology follow-up and sleep study.   Home Health: PT, RN Equipment/Devices: 2 L nasal cannula oxygen as needed Consultations: None  Discharge Condition: Stable    CODE STATUS: Full    Diet Recommendation: Heart Healthy Low Carb  Diet Order            Diet heart healthy/carb modified Room service appropriate? Yes; Fluid consistency: Thin  Diet effective now                  Chief Complaint  Patient presents with  . Covid Positive  . Weakness  . Cough     Brief history of present illness from the day of admission and additional interim summary    Gene Albrightis a 80 y.o.malewith medical history significant ofCKDstage III, HTN, IIDM,HLD, Guillain-Barr syndrome, prostate cancer status post radiation, presented with increasing shortness of breath and cough, he is unvaccinated for COVID-19, in the ER was diagnosed with acute hypoxic respiratory failure due to COVID-19 pneumonia.                                                                 Hospital Course   1. Acute Hypoxic Resp. Failure due to Acute Covid 19 Viral Pneumonitis during the ongoing 2020 Covid 19 Pandemic - he is not vaccinated for COVID-19, chose not to take the vaccine due to previous history of Gullian Barr syndrome.  Also did not want to try Remdesivir.  He was been  treated with IV steroids along with Actemra on 01/29/2021, clinically excellent improvement from 8 L he is down to 1 L at rest and completely symptom-free, upon ambulation he requires up to 2 L as needed, he is feeling close to baseline will be discharged home on a short course of oral steroid taper with PCP follow-up, 2 L as needed oxygen will be provided which I anticipate will be a short-term requirement.  SpO2: 100 % O2 Flow Rate (L/min): 2 L/min  Recent Labs  Lab 01/28/21 1157 01/28/21 1242 01/28/21 1435 01/29/21 0236 01/30/21 0249 01/31/21 0140 02/01/21 0124  WBC  --  7.4  --  5.4 20.4* 20.3* 16.4*  CRP  --   --   --  13.7* 5.5* 1.7* 0.9  DDIMER  --  1.93*  --  1.21* 0.83* 0.75* 1.11*  BNP  --   --   --  253.1*  --   --   --   PROCALCITON  --  <0.10  --  <0.10 <0.10 <0.10 0.12  LATICACIDVEN  --  2.2* 2.3*  --   --   --   --   AST  --  57*  --  47* 46* 42* 42*  ALT  --  31  --  27 31 31  35  ALKPHOS  --  82  --  72 72 69 69  BILITOT  --  1.0  --  0.8 0.7 0.6 1.0  ALBUMIN  --  2.6*  --  2.3* 2.3* 2.3* 2.3*  SARSCOV2NAA POSITIVE*  --   --   --   --   --   --       2.  A. fib with slow ventricular response.  Mali vas 2 score of 3.  Continue Eliquis along with telemetry monitoring.  He has chronic asymptomatic sinus bradycardia.  3.  AKI on CKD stage IIIa.  Baseline creatinine around 1.5. Hydrated will Monitor.  4.  BPH.  On Flomax, stable UA.   5.  Dyslipidemia.  On statin.  6.  Asymptomatic sinus bradycardia with likely sleep apnea at night.  Refuses to try CPAP, avoid AV node blocking agents, blood pressure stable and patient asymptomatic, outpatient cardiology and pulmonary follow-up for A. fib and possible underlying OSA respectively.   7.  DM type II.  Continue home regimen and follow with PCP.  Discharge diagnosis     Active Problems:   COVID-19 virus infection   COVID-19    Discharge instructions    Discharge Instructions    Discharge instructions    Complete by: As directed    Follow with Primary MD Vivi Barrack, MD in 7 days   Get CBC, CMP, 2 view Chest X ray -  checked next visit within 1 week by Primary MD   Activity: As tolerated with Full fall precautions use walker/cane & assistance as needed  Disposition Home   Diet: Heart Healthy Low Carb.  Special Instructions: If you have smoked or chewed Tobacco  in the last 2 yrs please stop smoking, stop any regular Alcohol  and or any Recreational drug use.  On your next visit with your primary care physician please Get Medicines reviewed and adjusted.  Please request your Prim.MD to go over all Hospital Tests and Procedure/Radiological results at the follow up, please get all Hospital records sent to your Prim MD by signing hospital release before you go home.  If you experience worsening of your admission symptoms, develop shortness of breath, life threatening emergency, suicidal or homicidal thoughts you must seek medical attention immediately by calling 911 or calling your MD immediately  if symptoms less severe.  You Must read complete instructions/literature along with all the possible adverse reactions/side effects for all the Medicines you take and that have been prescribed to you. Take any new Medicines after you have completely understood and accpet all the possible adverse reactions/side effects.   Increase activity slowly   Complete by: As directed    MyChart COVID-19 home monitoring program   Complete by: Feb 02, 2021    Is the patient willing to use the [facility] for home monitoring?: Yes   Temperature monitoring   Complete by: Feb 02, 2021    After how many days would you like to receive a notification of this patient's flowsheet entries?: 1      Discharge Medications   Allergies as  of 02/02/2021      Reactions   Lovastatin Other (See Comments)   REACTION: Foot pain   Simvastatin Other (See Comments)   REACTION: Myalgias      Medication List     STOP taking these medications   aspirin 81 MG tablet   azithromycin 250 MG tablet Commonly known as: Zithromax Z-Pak   ibuprofen 200 MG tablet Commonly known as: ADVIL     TAKE these medications   albuterol 108 (90 Base) MCG/ACT inhaler Commonly known as: VENTOLIN HFA Inhale 1-2 puffs into the lungs every 6 (six) hours as needed for wheezing or shortness of breath.   apixaban 5 MG Tabs tablet Commonly known as: ELIQUIS Take 1 tablet (5 mg total) by mouth 2 (two) times daily.   atorvastatin 80 MG tablet Commonly known as: LIPITOR TAKE 1 TABLET BY MOUTH  DAILY   azelastine 0.1 % nasal spray Commonly known as: ASTELIN Place 2 sprays into both nostrils 2 (two) times daily.   benzonatate 200 MG capsule Commonly known as: TESSALON Take 1 capsule (200 mg total) by mouth 2 (two) times daily as needed for cough.   fluticasone 0.05 % cream Commonly known as: CUTIVATE Apply 1 application topically 2 (two) times daily as needed (dry skin).   fluticasone 50 MCG/ACT nasal spray Commonly known as: FLONASE Place 2 sprays into both nostrils daily. 2 sprays in each nostrils as needed. What changed:   when to take this  reasons to take this  additional instructions   losartan-hydrochlorothiazide 100-25 MG tablet Commonly known as: HYZAAR TAKE 1 TABLET BY MOUTH  DAILY   MENS MULTIVITAMIN PO Take 1 tablet by mouth daily.   metFORMIN 500 MG 24 hr tablet Commonly known as: GLUCOPHAGE-XR Take 1 tablet (500 mg total) by mouth daily.   methylPREDNISolone 4 MG Tbpk tablet Commonly known as: MEDROL DOSEPAK follow package directions   tamsulosin 0.4 MG Caps capsule Commonly known as: FLOMAX Take 0.4 mg by mouth at bedtime.   valACYclovir 1000 MG tablet Commonly known as: [place]  (From admission, onward)         Start     Ordered   02/02/21 0721  For home use only DME oxygen  Once       Question Answer Comment  Length of Need  6 Months   Mode or (Route) Nasal cannula   Liters per Minute 2   Frequency Continuous (stationary and portable oxygen unit needed)   Oxygen conserving device Yes   Oxygen delivery system Gas      02/02/21 0720           Follow-up Information    Vivi Barrack, MD. Schedule an appointment as soon as possible for a visit in 1 week(s).   Specialty: Family Medicine Contact information: [address] [place] [address] [phone]               Major procedures and Radiology Reports - PLEASE review detailed and final reports thoroughly  -      CT Angio Chest PE W/Cm &/Or Wo Cm  Result Date: 01/28/2021 CLINICAL DATA:  Elevated D-dimer, COVID-19 positive, increased coughing since Monday. History of NASH, stage III chronic kidney disease, Guillain-Barre syndrome, hypertension, prostate cancer, type II diabetes mellitus EXAM: CT ANGIOGRAPHY CHEST WITH CONTRAST TECHNIQUE: Multidetector CT imaging of the chest was performed using the standard protocol during bolus administration of intravenous contrast. Multiplanar  CT image reconstructions and MIPs were obtained to evaluate the vascular anatomy. CONTRAST:  68m OMNIPAQUE IOHEXOL 350 MG/ML SOLN IV COMPARISON:  None FINDINGS: Cardiovascular: Atherosclerotic calcifications aorta, proximal great vessels, and minimally in coronary arteries. Aorta normal caliber without aneurysm. Heart unremarkable. No pericardial effusion. Pulmonary arteries adequately opacified and patent. No evidence of pulmonary embolism. Mediastinum/Nodes: Esophagus unremarkable. Base of cervical region normal appearance. No thoracic adenopathy. Lungs/Pleura: Patchy BILATERAL pulmonary infiltrates consistent with multifocal pneumonia and COVID-19. No pleural effusion or pneumothorax. Upper Abdomen: Visualized liver and spleen unremarkable Musculoskeletal: Unremarkable Review of the MIP images confirms the above findings. IMPRESSION: No evidence of pulmonary embolism. Patchy  BILATERAL pulmonary infiltrates consistent with multifocal pneumonia and COVID-19. Aortic Atherosclerosis (ICD10-I70.0). Electronically Signed   By: MLavonia DanaM.D.   On: 01/28/2021 17:01   DG Chest Port 1 View  Result Date: 01/31/2021 CLINICAL DATA:  COVID positive EXAM: PORTABLE CHEST 1 VIEW COMPARISON:  01/29/2021 FINDINGS: Bilateral interstitial and patchy alveolar airspace opacities consistent with atypical viral pneumonia. No pleural effusion or pneumothorax. Stable cardiomediastinal silhouette. No aggressive osseous lesion. IMPRESSION: 1. Bilateral interstitial and patchy alveolar airspace opacities consistent with atypical viral pneumonia. Electronically Signed   By: HKathreen Devoid  On: 01/31/2021 15:05   DG Chest Port 1 View  Result Date: 01/29/2021 CLINICAL DATA:  Shortness of breath.  COVID positive EXAM: PORTABLE CHEST 1 VIEW COMPARISON:  Yesterday FINDINGS: Mild progression of bilateral patchy infiltrate especially along the right minor fissure. No edema, effusion, or pneumothorax. Normal heart size. IMPRESSION: Atypical pneumonia with mild progression from yesterday. Electronically Signed   By: JMonte FantasiaM.D.   On: 01/29/2021 08:21   Portable chest 1 View  Result Date: 01/28/2021 CLINICAL DATA:  Shortness of breath.  COVID positive. EXAM: PORTABLE CHEST 1 VIEW COMPARISON:  None. FINDINGS: Normal heart size. No pleural effusion. Right upper lobe, left mid and left lower lobe interstitial and airspace densities are identified compatible with history of COVID. No pleural effusion. The visualized osseous structures are unremarkable. IMPRESSION: Bilateral pulmonary opacities compatible with history of COVID-19 infection. Electronically Signed   By: TKerby MoorsM.D.   On: 01/28/2021 12:39    Micro Results     Recent Results (from the past 240 hour(s))  SARS CORONAVIRUS 2 (TAT 6-24 HRS) Nasopharyngeal Nasopharyngeal Swab     Status: Abnormal   Collection Time: 01/28/21 11:57 AM    Specimen: Nasopharyngeal Swab  Result Value Ref Range Status   SARS Coronavirus 2 POSITIVE (A) NEGATIVE Final    Comment: (NOTE) SARS-CoV-2 target nucleic acids are DETECTED.  The SARS-CoV-2 RNA is generally detectable in upper and lower respiratory specimens during the acute phase of infection. Positive results are indicative of the presence of SARS-CoV-2 RNA. Clinical correlation with patient history and other diagnostic information is  necessary to determine patient infection status. Positive results do not rule out bacterial infection or co-infection with other viruses.  The expected result is Negative.  Fact Sheet for Patients: hSugarRoll.be Fact Sheet for Healthcare Providers: hhttps://www.woods-mathews.com/ This test is not yet approved or cleared by the [place] and  has been authorized for detection and/or diagnosis of SARS-CoV-2 by FDA under an Emergency Use Authorization (EUA). This EUA will remain  in effect (meaning this test can be used) for the duration of the COVID-19 declaration under Section 564(b)(1) of the Act, 21 U. S.C. section 360bbb-3(b)(1), unless the authorization is terminated or revoked sooner.   Performed at [facility], [address], [place], [place] [address]   Culture, Urine     Status: None   Collection Time: 01/29/21  9:13 AM   Specimen: Urine, Random  Result Value Ref Range Status   Specimen Description URINE, RANDOM  Final   Special Requests NONE  Final   Culture   Final    NO GROWTH Performed at [facility], [place] [address], [place], [place] [address]    Report Status 01/30/2021 FINAL  Final    Today   Subjective    [name] today has no headache,no chest abdominal pain,no new weakness tingling or numbness, feels much better wants to go home today.    Objective   Blood pressure 136/72, pulse 60, temperature 98.2 F (36.8 C), temperature source Axillary,  resp. rate 15, height 5' 8"  (1.727 m), weight 80.2 kg, SpO2 100 %.   Intake/Output Summary (Last 24 hours) at 02/02/2021 0821 Last data filed at 02/02/2021 0100 Gross per 24 hour  Intake --  Output 300 ml  Net -300 ml    Exam  Awake Alert, No new F.N deficits, Normal affect [place].AT,PERRAL Supple Neck,No JVD, No cervical lymphadenopathy appriciated.  Symmetrical Chest wall movement, Good air movement bilaterally, CTAB RRR,No Gallops,Rubs or new Murmurs, No Parasternal Heave +ve B.Sounds, Abd Soft, Non tender, No organomegaly appriciated, No rebound -guarding or rigidity. No Cyanosis, Clubbing or edema, No new Rash or bruise   Data Review   CBC w Diff:  Lab Results  Component Value Date   WBC 16.4 (H) 02/01/2021   HGB 13.5 02/01/2021   HCT 39.0 02/01/2021   PLT 291 02/01/2021   LYMPHOPCT 3 02/01/2021   MONOPCT 4 02/01/2021   EOSPCT 0 02/01/2021   BASOPCT 0 02/01/2021    CMP:  Lab Results  Component Value Date   NA 140 02/01/2021   K 4.1 02/01/2021   CL 108 02/01/2021   CO2 23 02/01/2021   BUN 54 (H) 02/01/2021   CREATININE 1.48 (H) 02/01/2021   CREATININE 1.11 12/27/2014   PROT 5.6 (L) 02/01/2021   ALBUMIN 2.3 (L) 02/01/2021   BILITOT 1.0 02/01/2021   ALKPHOS 69 02/01/2021   AST 42 (H) 02/01/2021   ALT 35 02/01/2021  .   Total Time in preparing paper work, data evaluation and todays exam - 14 minutes  Lala Lund M.D on 02/02/2021 at 8:21 AM  [facility]

## 2021-02-02 NOTE — Telephone Encounter (Signed)
Referral placed.

## 2021-02-02 NOTE — Progress Notes (Signed)
[name] D/C'd home per MD order. D/C instructions discussed with the patient and questions answered.  An After Visit Summary was printed and given to the patient.  Patient escorted via [place], and D/C home via private auto. Patient belongings and valuables sent with patient. Oxygen tank and concentrator were given to patient. Jaydee Conran J Ayomide Zuleta  02/02/2021 4:01 PM

## 2021-02-03 DIAGNOSIS — U071 COVID-19: Secondary | ICD-10-CM | POA: Diagnosis not present

## 2021-02-06 ENCOUNTER — Telehealth: Payer: Self-pay

## 2021-02-06 ENCOUNTER — Telehealth: Payer: Self-pay | Admitting: Family Medicine

## 2021-02-06 NOTE — Chronic Care Management (AMB) (Signed)
  Chronic Care Management   Note  02/06/2021 Name: [name] MRN: [id number] DOB: Jul 18, 1941  [name] is a 80 y.o. year old male who is a primary care patient of [name], Algis Greenhouse, MD. I reached out to [name] by phone today in response to a referral sent by [name]'s PCP, Vivi Barrack, MD.   [name] was given information about Chronic Care Management services today including:  1. CCM service includes personalized support from designated clinical staff supervised by his physician, including individualized plan of care and coordination with other care providers 2. 24/7 contact phone numbers for assistance for urgent and routine care needs. 3. Service will only be billed when office clinical staff spend 20 minutes or more in a month to coordinate care. 4. Only one practitioner may furnish and bill the service in a calendar month. 5. The patient may stop CCM services at any time (effective at the end of the month) by phone call to the office staff.   Patient did not agree to enrollment in care management services and does not wish to consider at this time.  Follow up plan:   Lauretta Grill Upstream Scheduler

## 2021-02-06 NOTE — Telephone Encounter (Cosign Needed)
Transition Care Management Follow-up Telephone Call  Date of discharge and from where: 02/02/21 [facility]  How have you been since you were released from the hospital? A little rough but ok  Any questions or concerns? No  Items Reviewed:  Did the pt receive and understand the discharge instructions provided? Yes   Medications obtained and verified? Yes   Other? No   Any new allergies since your discharge? No   Dietary orders reviewed? No  Do you have support at home? Yes   Home Care and Equipment/Supplies: Were home health services ordered? not applicable If so, what is the name of the agency?   Has the agency set up a time to come to the patient's home? not applicable Were any new equipment or medical supplies ordered?  No What is the name of the medical supply agency?  Were you able to get the supplies/equipment? not applicable Do you have any questions related to the use of the equipment or supplies? No  Functional Questionnaire: (I = Independent and D = Dependent) ADLs: I  Bathing/Dressing- I  Meal Prep- I  Eating- I  Maintaining continence- I  Transferring/Ambulation- I  Managing Meds- I  Follow up appointments reviewed:   PCP Hospital f/u appt confirmed? Yes  Scheduled to see Dr [name] 02/07/21 @ 8:00 a.m   Specialist Hospital f/u appt confirmed? No    Are transportation arrangements needed? No   If their condition worsens, is the pt aware to call PCP or go to the Emergency Dept.? Yes  Was the patient provided with contact information for the PCP's office or ED? Yes  Was to pt encouraged to call back with questions or concerns? Yes

## 2021-02-07 ENCOUNTER — Other Ambulatory Visit: Payer: Self-pay

## 2021-02-07 ENCOUNTER — Ambulatory Visit (INDEPENDENT_AMBULATORY_CARE_PROVIDER_SITE_OTHER): Payer: Medicare Other

## 2021-02-07 ENCOUNTER — Encounter: Payer: Self-pay | Admitting: Family Medicine

## 2021-02-07 ENCOUNTER — Ambulatory Visit (INDEPENDENT_AMBULATORY_CARE_PROVIDER_SITE_OTHER): Payer: Medicare Other | Admitting: Family Medicine

## 2021-02-07 VITALS — BP 120/70 | HR 77 | Temp 97.3°F | Wt 170.2 lb

## 2021-02-07 DIAGNOSIS — I152 Hypertension secondary to endocrine disorders: Secondary | ICD-10-CM

## 2021-02-07 DIAGNOSIS — E1159 Type 2 diabetes mellitus with other circulatory complications: Secondary | ICD-10-CM | POA: Diagnosis not present

## 2021-02-07 DIAGNOSIS — E119 Type 2 diabetes mellitus without complications: Secondary | ICD-10-CM

## 2021-02-07 DIAGNOSIS — I4891 Unspecified atrial fibrillation: Secondary | ICD-10-CM | POA: Diagnosis not present

## 2021-02-07 DIAGNOSIS — G4730 Sleep apnea, unspecified: Secondary | ICD-10-CM

## 2021-02-07 DIAGNOSIS — U071 COVID-19: Secondary | ICD-10-CM

## 2021-02-07 LAB — COMPREHENSIVE METABOLIC PANEL
ALT: 49 U/L (ref 0–53)
AST: 36 U/L (ref 0–37)
Albumin: 3.4 g/dL — ABNORMAL LOW (ref 3.5–5.2)
Alkaline Phosphatase: 73 U/L (ref 39–117)
BUN: 47 mg/dL — ABNORMAL HIGH (ref 6–23)
CO2: 24 mEq/L (ref 19–32)
Calcium: 8.9 mg/dL (ref 8.4–10.5)
Chloride: 104 mEq/L (ref 96–112)
Creatinine, Ser: 1.62 mg/dL — ABNORMAL HIGH (ref 0.40–1.50)
GFR: 40.32 mL/min — ABNORMAL LOW (ref >60.00)
Glucose, Bld: 134 mg/dL — ABNORMAL HIGH (ref 70–99)
Potassium: 4.8 mEq/L (ref 3.5–5.1)
Sodium: 138 mEq/L (ref 135–145)
Total Bilirubin: 1.1 mg/dL (ref 0.2–1.2)
Total Protein: 6.1 g/dL (ref 6.0–8.3)

## 2021-02-07 LAB — CBC
HCT: 47 % (ref 39.0–52.0)
Hemoglobin: 16 g/dL (ref 13.0–17.0)
MCHC: 34 g/dL (ref 30.0–36.0)
MCV: 90.5 fl (ref 78.0–100.0)
Platelets: 264 10*3/uL (ref 150.0–400.0)
RBC: 5.2 Mil/uL (ref 4.22–5.81)
RDW: 14.5 % (ref 11.5–15.5)
WBC: 8.7 10*3/uL (ref 4.0–10.5)

## 2021-02-07 NOTE — Assessment & Plan Note (Signed)
Last A1c 6.1.  Continue Metformin 500 mg daily.

## 2021-02-07 NOTE — Progress Notes (Signed)
Chief Complaint:  [name] is a 80 y.o. male who presents today for a TCM visit.  Assessment/Plan:  Chronic Problems Addressed Today: Hypertension associated with diabetes ([place]) At goal.  Continue losartan-HCTZ 100-25 once daily we will check labs today.  Diabetes ([place]) Last A1c 6.1.  Continue Metformin 500 mg daily.  Sleep-disordered breathing Was noted to have desaturations at night while in the hospital with concern for sleep apnea.  Has referral to sleep medicine pending.  Afib ([place]) Anticoagulated on eliquis. Will place refer to cardiology for further management.   COVID-19 Thankfully seems to be recovering well.  Encouraged good oral hydration frequent ambulation and continue to work with his spirometer.  Will check chest x-ray today.    Subjective:  HPI:  Summary of Hospital admission: Reason for admission: [place] Date of admission: 01/28/2021 Date of discharge: 02/02/2021 Date of Interactive contact: 02/06/2021 Summary of Hospital course: Patient presented to the ED with increasing shortness of breath and cough in setting of known Covid diagnosis.  In the ED was found to be hypoxic and was admitted for supportive care and IV steroids.  He required oxygen on admission was able to be weaned down to 2 L as needed at the time of discharge.  He was given IV steroids and Actemra during admission.  Respiratory status improved he was discharged home on 02/02/2021.  During admission he was noted to be in atrial fibrillation with slow ventricular response.  He was started on Eliquis.    Interim history:  He has done well since being home.  He is still not back to his baseline however has not had any issues with shortness of breath.  Energy levels are still low.  He has been trying to maintain good oral hydration.  He has been tolerating Eliquis well without any side effects or signs of bleeding.   ROS: Per HPI, otherwise a complete review of systems was negative.   PMH:  The  following were reviewed and entered/updated in epic: Past Medical History:  Diagnosis Date  . CKD (chronic kidney disease), stage III ([place])   . Dyslipidemia   . ED (erectile dysfunction)   . History of Guillain-Barre syndrome    per pt 1990s fron Flu Vaccine,  stated resolved with no residual  . History of nuclear stress test    08-23-2004  by dr Ron Jomes Giraldo--- normal without ischemia,  ef 68%  . Hyperplasia of prostate with lower urinary tract symptoms (LUTS)   . Hypertension   . NASH (nonalcoholic steatohepatitis) 2010   followed by pcp  . Prostate cancer [facility]) urologist-- dr winter;  oncologist-- dr Tammi Klippel   dx 02-26-2019,  Stage T1c,  Gleason 3+4, vol 80.1cc;  plan external radiation  . Rhinorrhea   . Type 2 diabetes mellitus [facility])    Patient Active Problem List   Diagnosis Date Noted  . Afib ([place]) 02/07/2021  . Sleep-disordered breathing 02/07/2021  . COVID-19 01/28/2021  . Pain due to onychomycosis of toenails of both feet 05/01/2019  . Malignant neoplasm of prostate ([place]) 03/26/2019  . Rhinorrhea 10/06/2018  . Actinic keratosis 10/06/2018  . Guillain-Barre syndrome ([place]) 01/06/2013  . COUGH DUE TO ACE INHIBITORS 01/10/2011  . NASH (nonalcoholic steatohepatitis) 01/12/2009  . Diabetes ([place]) 07/09/2007  . Dyslipidemia associated with type 2 diabetes mellitus ([place]) 07/09/2007  . Hypertension associated with diabetes ([place]) 07/09/2007   Past Surgical History:  Procedure Laterality Date  . CARPAL TUNNEL RELEASE Right 1990s  . GOLD SEED IMPLANT N/A  05/20/2019   Procedure: GOLD SEED IMPLANT;  Surgeon: Ceasar Mons, MD;  Location: [facility];  Service: Urology;  Laterality: N/A;  . HERNIA REPAIR  infant  . prostate biopsy  02-26-2019  dr winter office  . SPACE OAR INSTILLATION N/A 05/20/2019   Procedure: SPACE OAR INSTILLATION;  Surgeon: Ceasar Mons, MD;  Location: [facility];  Service: Urology;  Laterality: N/A;  .  TONSILLECTOMY  child    Family History  Problem Relation Age of Onset  . Congestive Heart Failure Mother   . Cirrhosis Father   . Cancer Neg Hx     Medications- Reconciled discharge and current medications in Epic.  Current Outpatient Medications  Medication Sig Dispense Refill  . apixaban (ELIQUIS) 5 MG TABS tablet Take 1 tablet (5 mg total) by mouth 2 (two) times daily. 60 tablet 0  . atorvastatin (LIPITOR) 80 MG tablet TAKE 1 TABLET BY MOUTH  DAILY (Patient taking differently: Take 80 mg by mouth daily.) 90 tablet 3  . ibuprofen (ADVIL) 100 MG tablet Take 200 mg by mouth 2 (two) times daily.    Marland Kitchen losartan-hydrochlorothiazide (HYZAAR) 100-25 MG tablet TAKE 1 TABLET BY MOUTH  DAILY (Patient taking differently: Take 1 tablet by mouth daily.) 90 tablet 3  . metFORMIN (GLUCOPHAGE-XR) 500 MG 24 hr tablet Take 1 tablet (500 mg total) by mouth daily. 90 tablet 3  . Multiple Vitamins-Minerals (MENS MULTIVITAMIN PO) Take 1 tablet by mouth daily.    . tamsulosin (FLOMAX) 0.4 MG CAPS capsule Take 0.4 mg by mouth at bedtime.     . fluticasone (CUTIVATE) 0.05 % cream Apply 1 application topically 2 (two) times daily as needed (dry skin). (Patient not taking: Reported on 02/07/2021)    . fluticasone (FLONASE) 50 MCG/ACT nasal spray Place 2 sprays into both nostrils daily. 2 sprays in each nostrils as needed. (Patient not taking: Reported on 02/07/2021) 48 g 3  . valACYclovir (VALTREX) 1000 MG tablet  (Patient not taking: No sig reported)     No current facility-administered medications for this visit.    Allergies-reviewed and updated Allergies  Allergen Reactions  . Lovastatin Other (See Comments)    REACTION: Foot pain  . Simvastatin Other (See Comments)    REACTION: Myalgias    Social History   Socioeconomic History  . Marital status: Widowed    Spouse name: Not on file  . Number of children: 2  . Years of education: Not on file  . Highest education level: Not on file  Occupational  History  . Occupation: Retired  Tobacco Use  . Smoking status: Former Smoker    Packs/day: 1.00    Years: 15.00    Pack years: 15.00    Types: Cigarettes    Quit date: 11/05/1981    Years since quitting: 39.2  . Smokeless tobacco: Never Used  Vaping Use  . Vaping Use: Never used  Substance and Sexual Activity  . Alcohol use: No    Alcohol/week: 0.0 standard drinks  . Drug use: Never  . Sexual activity: Not Currently  Other Topics Concern  . Not on file  Social History Narrative   Lives alone   Drives    Social Determinants of Health   Financial Resource Strain: Not on file  Food Insecurity: Not on file  Transportation Needs: Not on file  Physical Activity: Not on file  Stress: Not on file  Social Connections: Not on file        Objective:  Physical Exam: BP 120/70   Pulse 77   Temp (!) 97.3 F (36.3 C) (Temporal)   Wt 170 lb 3.2 oz (77.2 kg)   SpO2 98%   BMI 25.88 kg/m   Gen: NAD, resting comfortably CV: Bradycardic with no murmurs appreciated Pulm: NWOB, CTAB with no crackles, wheezes, or rhonchi GI: Normal bowel sounds present. Soft, Nontender, Nondistended. MSK: No edema, cyanosis, or clubbing noted Skin: Warm, dry Neuro: Grossly normal, moves all extremities Psych: Normal affect and thought content      Mario Voong M. [name], MD 02/07/2021 8:49 AM

## 2021-02-07 NOTE — Progress Notes (Signed)
Please inform patient of the following:  Blood work is stable and his xray shows improvement. Do not need to do any other testing at this point. He needs to follow up with cardiology as we discussed at his office visit.  Algis Greenhouse. [name], MD 02/07/2021 12:52 PM

## 2021-02-07 NOTE — Patient Instructions (Signed)
It was very nice to see you today!  I am glad that you are feeling better.  We will check blood work and a chest x-ray today.  Please let us know if you have not heard back from the referrals by the end of the week.  Take care, Dr [name]  PLEASE NOTE:  If you had any lab tests please let us know if you have not heard back within a few days. You may see your results on mychart before we have a chance to review them but we will give you a call once they are reviewed by [place]. If we ordered any referrals today, please let us know if you have not heard from their office within the next week.   Please try these tips to maintain a healthy lifestyle:   Eat at least 3 REAL meals and 1-2 snacks per day.  Aim for no more than 5 hours between eating.  If you eat breakfast, please do so within one hour of getting up.    Each meal should contain half fruits/vegetables, one quarter protein, and one quarter carbs (no bigger than a computer mouse)   Cut down on sweet beverages. This includes juice, soda, and sweet tea.     Drink at least 1 glass of water with each meal and aim for at least 8 glasses per day   Exercise at least 150 minutes every week.

## 2021-02-07 NOTE — Assessment & Plan Note (Signed)
At goal.  Continue losartan-HCTZ 100-25 once daily we will check labs today.

## 2021-02-07 NOTE — Assessment & Plan Note (Signed)
Anticoagulated on eliquis. Will place refer to cardiology for further management.

## 2021-02-07 NOTE — Assessment & Plan Note (Signed)
Was noted to have desaturations at night while in the hospital with concern for sleep apnea.  Has referral to sleep medicine pending.

## 2021-02-07 NOTE — Assessment & Plan Note (Signed)
Thankfully seems to be recovering well.  Encouraged good oral hydration frequent ambulation and continue to work with his spirometer.  Will check chest x-ray today.

## 2021-02-09 ENCOUNTER — Inpatient Hospital Stay: Payer: Medicare Other | Admitting: Family Medicine

## 2021-02-15 ENCOUNTER — Encounter: Payer: Self-pay | Admitting: Physician Assistant

## 2021-02-15 ENCOUNTER — Other Ambulatory Visit: Payer: Self-pay

## 2021-02-15 ENCOUNTER — Ambulatory Visit (INDEPENDENT_AMBULATORY_CARE_PROVIDER_SITE_OTHER): Payer: Medicare Other | Admitting: Physician Assistant

## 2021-02-15 VITALS — BP 130/66 | HR 93 | Temp 97.7°F | Ht 68.0 in | Wt 174.2 lb

## 2021-02-15 DIAGNOSIS — G933 Postviral fatigue syndrome: Secondary | ICD-10-CM

## 2021-02-15 DIAGNOSIS — G9331 Postviral fatigue syndrome: Secondary | ICD-10-CM

## 2021-02-15 DIAGNOSIS — R059 Cough, unspecified: Secondary | ICD-10-CM | POA: Diagnosis not present

## 2021-02-15 LAB — COMPREHENSIVE METABOLIC PANEL
ALT: 30 U/L (ref 0–53)
AST: 30 U/L (ref 0–37)
Albumin: 3.1 g/dL — ABNORMAL LOW (ref 3.5–5.2)
Alkaline Phosphatase: 60 U/L (ref 39–117)
BUN: 39 mg/dL — ABNORMAL HIGH (ref 6–23)
CO2: 25 mEq/L (ref 19–32)
Calcium: 8.1 mg/dL — ABNORMAL LOW (ref 8.4–10.5)
Chloride: 102 mEq/L (ref 96–112)
Creatinine, Ser: 1.39 mg/dL (ref 0.40–1.50)
GFR: 48.45 mL/min — ABNORMAL LOW (ref >60.00)
Glucose, Bld: 217 mg/dL — ABNORMAL HIGH (ref 70–99)
Potassium: 3.6 mEq/L (ref 3.5–5.1)
Sodium: 136 mEq/L (ref 135–145)
Total Bilirubin: 0.6 mg/dL (ref 0.2–1.2)
Total Protein: 5.4 g/dL — ABNORMAL LOW (ref 6.0–8.3)

## 2021-02-15 LAB — POC URINALSYSI DIPSTICK (AUTOMATED)
Bilirubin, UA: NEGATIVE
Blood, UA: NEGATIVE
Glucose, UA: NEGATIVE
Ketones, UA: NEGATIVE
Leukocytes, UA: NEGATIVE
Nitrite, UA: NEGATIVE
Protein, UA: NEGATIVE
Spec Grav, UA: 1.025 (ref 1.010–1.025)
Urobilinogen, UA: 0.2 E.U./dL
pH, UA: 5.5 (ref 5.0–8.0)

## 2021-02-15 LAB — CBC WITH DIFFERENTIAL/PLATELET
Basophils Absolute: 0 10*3/uL (ref 0.0–0.1)
Basophils Relative: 1.1 % (ref 0.0–3.0)
Eosinophils Absolute: 0.1 10*3/uL (ref 0.0–0.7)
Eosinophils Relative: 2.3 % (ref 0.0–5.0)
HCT: 42.4 % (ref 39.0–52.0)
Hemoglobin: 14.2 g/dL (ref 13.0–17.0)
Lymphocytes Relative: 15.5 % (ref 12.0–46.0)
Lymphs Abs: 0.5 10*3/uL — ABNORMAL LOW (ref 0.7–4.0)
MCHC: 33.5 g/dL (ref 30.0–36.0)
MCV: 91.8 fl (ref 78.0–100.0)
Monocytes Absolute: 0.3 10*3/uL (ref 0.1–1.0)
Monocytes Relative: 9.9 % (ref 3.0–12.0)
Neutro Abs: 2.3 10*3/uL (ref 1.4–7.7)
Neutrophils Relative %: 71.2 % (ref 43.0–77.0)
Platelets: 95 10*3/uL — ABNORMAL LOW (ref 150.0–400.0)
RBC: 4.62 Mil/uL (ref 4.22–5.81)
RDW: 15.4 % (ref 11.5–15.5)
WBC: 3.2 10*3/uL — ABNORMAL LOW (ref 4.0–10.5)

## 2021-02-15 NOTE — Patient Instructions (Addendum)
It was great to see you!  Your A1c is well controlled and I don't want the metformin to stress your kidneys as you continue to recover. Let's HOLD the metformin. You can discuss resuming this with Dr. [name] when you follow-up with him.  Please drink plenty of water! At least 48 oz of PLAIN water daily.  I'm going to update labs and urine today to make sure there is nothing else is going on.  Consider physical therapy to help you get your strength back. Let me or [name] know if you are interested in this.  Consider wearing oxygen while you sleep at night until we get you to a sleep study.  Take care,  Inda Coke PA-C

## 2021-02-15 NOTE — Progress Notes (Signed)
[name] is a 80 y.o. male here for a follow up of a pre-existing problem.  I acted as a Education administrator for [employer], PA-C Gene Pickler, [name]   History of Present Illness:   Chief Complaint  Patient presents with  . Cough    HPI  Cough/Fatigue Pt c/o persistent dry non-productive cough. Denies fever or chills. Tessalon Pearles not helping. Stopped using oxygen 3-4 days ago. Oxygen level is 95-98. Has some SOB with daily activities. All symptoms have been present since hospitalization d/c on 02/02/21. He followed up with PCP after that hospitalization and was found to be doing well, with recommendations for sleep study and cardiology. He has been compliant with all of his medications None of his symptoms have worsened. Denies: chest pain, muscle pain, calf pain, swelling of LE. He is concerned because he still has fatigue from COVID-19. Went to [employer] since seeing PCP and felt like he couldn't enjoy himself due to fatigue. Daughter present and states that he does not drink hardly any water throughout the day.  Tested positive for COVID-19 on 01/20/21 -- is almost one month ago.    Past Medical History:  Diagnosis Date  . CKD (chronic kidney disease), stage III ([place])   . Dyslipidemia   . ED (erectile dysfunction)   . History of Guillain-Barre syndrome    per pt 1990s fron Flu Vaccine,  stated resolved with no residual  . History of nuclear stress test    08-23-2004  by Gene [name]--- normal without ischemia,  ef 68%  . Hyperplasia of prostate with lower urinary tract symptoms (LUTS)   . Hypertension   . NASH (nonalcoholic steatohepatitis) 2010   followed by pcp  . Prostate cancer [name]) urologist-- [name];  oncologist-- [name]   dx 02-26-2019,  Stage T1c,  Gleason 3+4, vol 80.1cc;  plan external radiation  . Rhinorrhea   . Type 2 diabetes mellitus ([facility])      Social History   Tobacco Use  . Smoking status: Former Smoker    Packs/day: 1.00    Years: 15.00    Pack  years: 15.00    Types: Cigarettes    Quit date: 11/05/1981    Years since quitting: 39.3  . Smokeless tobacco: Never Used  Vaping Use  . Vaping Use: Never used  Substance Use Topics  . Alcohol use: No    Alcohol/week: 0.0 standard drinks  . Drug use: Never    Past Surgical History:  Procedure Laterality Date  . CARPAL TUNNEL RELEASE Right 1990s  . GOLD SEED IMPLANT N/A 05/20/2019   Procedure: GOLD SEED IMPLANT;  Surgeon: Gene Mons, MD;  Location: [name];  Service: Urology;  Laterality: N/A;  . HERNIA REPAIR  infant  . prostate biopsy  02-26-2019  [name] office  . SPACE OAR INSTILLATION N/A 05/20/2019   Procedure: SPACE OAR INSTILLATION;  Surgeon: Gene Mons, MD;  Location: [name];  Service: Urology;  Laterality: N/A;  . TONSILLECTOMY  child    Family History  Problem Relation Age of Onset  . Congestive Heart Failure Mother   . Cirrhosis Father   . Cancer Neg Hx     Allergies  Allergen Reactions  . Lovastatin Other (See Comments)    REACTION: Foot pain  . Simvastatin Other (See Comments)    REACTION: Myalgias    Current Medications:   Current Outpatient Medications:  .  apixaban (ELIQUIS) 5 MG TABS tablet, Take 1 tablet (  5 mg total) by mouth 2 (two) times daily., Disp: 60 tablet, Rfl: 0 .  atorvastatin (LIPITOR) 80 MG tablet, TAKE 1 TABLET BY MOUTH  DAILY (Patient taking differently: Take 80 mg by mouth daily.), Disp: 90 tablet, Rfl: 3 .  ibuprofen (ADVIL) 100 MG tablet, Take 200 mg by mouth 2 (two) times daily., Disp: , Rfl:  .  losartan-hydrochlorothiazide (HYZAAR) 100-25 MG tablet, TAKE 1 TABLET BY MOUTH  DAILY (Patient taking differently: Take 1 tablet by mouth daily.), Disp: 90 tablet, Rfl: 3 .  metFORMIN (GLUCOPHAGE-XR) 500 MG 24 hr tablet, Take 1 tablet (500 mg total) by mouth daily., Disp: 90 tablet, Rfl: 3 .  Multiple Vitamins-Minerals (MENS MULTIVITAMIN PO), Take 1 tablet by mouth daily.,  Disp: , Rfl:  .  tamsulosin (FLOMAX) 0.4 MG CAPS capsule, Take 0.4 mg by mouth at bedtime. , Disp: , Rfl:  .  fluticasone (FLONASE) 50 MCG/ACT nasal spray, Place 2 sprays into both nostrils daily. 2 sprays in each nostrils as needed. (Patient not taking: No sig reported), Disp: 48 g, Rfl: 3   Review of Systems:   ROS Negative unless otherwise specified per HPI.  Vitals:   Vitals:   02/15/21 1024  BP: 130/66  Pulse: 93  Temp: 97.7 F (36.5 C)  TempSrc: Temporal  SpO2: 98%  Weight: 174 lb 4 oz (79 kg)  Height: 5' 8"  (1.727 m)     Body mass index is 26.49 kg/m.  Physical Exam:   Physical Exam Vitals and nursing note reviewed.  Constitutional:      General: He is not in acute distress.    Appearance: He is well-developed. He is not ill-appearing or toxic-appearing.  Cardiovascular:     Rate and Rhythm: Normal rate and regular rhythm.     Pulses: Normal pulses.     Heart sounds: Normal heart sounds, S1 normal and S2 normal.     Comments: No LE edema Pulmonary:     Effort: Pulmonary effort is normal.     Breath sounds: Normal breath sounds.  Skin:    General: Skin is warm and dry.  Neurological:     Mental Status: He is alert.     GCS: GCS eye subscore is 4. GCS verbal subscore is 5. GCS motor subscore is 6.  Psychiatric:        Speech: Speech normal.        Behavior: Behavior normal. Behavior is cooperative.     Results for orders placed or performed in visit on 02/15/21  POCT Urinalysis Dipstick (Automated)  Result Value Ref Range   Color, UA yellow    Clarity, UA clear    Glucose, UA Negative Negative   Bilirubin, UA neg    Ketones, UA neg    Spec Grav, UA 1.025 1.010 - 1.025   Blood, UA neg    pH, UA 5.5 5.0 - 8.0   Protein, UA Negative Negative   Urobilinogen, UA 0.2 0.2 or 1.0 E.U./dL   Nitrite, UA neg    Leukocytes, UA Negative Negative    Assessment and Plan:   [name] was seen today for cough.  Diagnoses and all orders for this  visit:  Postviral fatigue syndrome No red flags on exam/discussion. Suspect he has some long haul COVID sx, dehydration, and uncontrolled OSA. Update blood work today. Provided the following recommendations: -Let's HOLD the metformin.  -Push fluids -Update blood work and urine -Start daily Vit D that his daughter has already purchased -Consider physical therapy (he refused  today) -Consider wearing oxygen while you sleep at night until sleep study -     CBC with Differential/Platelet -     Comprehensive metabolic panel -     POCT Urinalysis Dipstick (Automated)  Cough No red flags, and in NAD. Trial oral delsym to help with symptoms. Last xray was 1 week ago, we reviewed, do not feel this is warranted today. If persists, consider pulm referral.  CMA or [name] served as scribe during this visit. History, Physical, and Plan performed by medical provider. The above documentation has been reviewed and is accurate and complete.  Time spent with patient today was 25 minutes which consisted of chart review, discussing diagnosis, discussion plan with PCP, work up, treatment answering questions and documentation.   Inda Coke, PA-C

## 2021-02-16 ENCOUNTER — Other Ambulatory Visit: Payer: Self-pay | Admitting: Physician Assistant

## 2021-02-16 DIAGNOSIS — D696 Thrombocytopenia, unspecified: Secondary | ICD-10-CM

## 2021-02-24 ENCOUNTER — Other Ambulatory Visit: Payer: Self-pay

## 2021-02-24 ENCOUNTER — Other Ambulatory Visit (INDEPENDENT_AMBULATORY_CARE_PROVIDER_SITE_OTHER): Payer: Medicare Other

## 2021-02-24 ENCOUNTER — Other Ambulatory Visit: Payer: Self-pay | Admitting: Physician Assistant

## 2021-02-24 ENCOUNTER — Other Ambulatory Visit: Payer: Medicare Other

## 2021-02-24 DIAGNOSIS — D696 Thrombocytopenia, unspecified: Secondary | ICD-10-CM

## 2021-02-24 LAB — CBC WITH DIFFERENTIAL/PLATELET
Basophils Absolute: 0 10*3/uL (ref 0.0–0.1)
Basophils Relative: 0.6 % (ref 0.0–3.0)
Eosinophils Absolute: 0.2 10*3/uL (ref 0.0–0.7)
Eosinophils Relative: 8.5 % — ABNORMAL HIGH (ref 0.0–5.0)
HCT: 39.1 % (ref 39.0–52.0)
Hemoglobin: 13.3 g/dL (ref 13.0–17.0)
Lymphocytes Relative: 32.2 % (ref 12.0–46.0)
Lymphs Abs: 0.8 10*3/uL (ref 0.7–4.0)
MCHC: 34.1 g/dL (ref 30.0–36.0)
MCV: 92.3 fl (ref 78.0–100.0)
Monocytes Absolute: 0.3 10*3/uL (ref 0.1–1.0)
Monocytes Relative: 12 % (ref 3.0–12.0)
Neutro Abs: 1.2 10*3/uL — ABNORMAL LOW (ref 1.4–7.7)
Neutrophils Relative %: 46.7 % (ref 43.0–77.0)
Platelets: 70 10*3/uL — ABNORMAL LOW (ref 150.0–400.0)
RBC: 4.24 Mil/uL (ref 4.22–5.81)
RDW: 17.2 % — ABNORMAL HIGH (ref 11.5–15.5)
WBC: 2.6 10*3/uL — ABNORMAL LOW (ref 4.0–10.5)

## 2021-02-27 LAB — PATHOLOGIST SMEAR REVIEW

## 2021-02-28 ENCOUNTER — Other Ambulatory Visit: Payer: Self-pay | Admitting: *Deleted

## 2021-02-28 DIAGNOSIS — D696 Thrombocytopenia, unspecified: Secondary | ICD-10-CM

## 2021-03-02 ENCOUNTER — Other Ambulatory Visit: Payer: Self-pay

## 2021-03-02 NOTE — Telephone Encounter (Signed)
MEDICATION: apixaban (ELIQUIS) 5 MG TABS tablet  PHARMACY: [facility]   Comments: Pt needs this medication refilled ASAP. He will be out of pills on Sunday. Pt asked for someone to return his call and let him know the refill has been approved.   **Let patient know to contact pharmacy at the end of the day to make sure medication is ready. **  ** Please notify patient to allow 48-72 hours to process**  **Encourage patient to contact the pharmacy for refills or they can request refills through [facility]**

## 2021-03-02 NOTE — Telephone Encounter (Signed)
Last Ordering User:  Thurnell Lose, MD

## 2021-03-03 MED ORDER — APIXABAN 5 MG PO TABS: 5.0000 mg | ORAL_TABLET | Freq: Two times a day (BID) | ORAL | 0 refills | Status: DC

## 2021-03-15 NOTE — Progress Notes (Signed)
Referring-Caleb [name], MD Reason for referral-atrial fibrillation  HPI: 80 year old male for evaluation of atrial fibrillation at request of Dimas Chyle, MD.  Nuclear study 2005 showed normal LV function and no ischemia.  CTA March 2022 showed no pulmonary embolus and patchy infiltrates consistent with pneumonia and COVID-19.  Patient recently discharged following admission for COVID-pneumonia.  Also noted to have atrial fibrillation.  Cardiology now asked to evaluate.  Patient denies dyspnea, chest pain, palpitations or syncope.  He has chronic pedal edema.  No bleeding.  Current Outpatient Medications  Medication Sig Dispense Refill  . apixaban (ELIQUIS) 5 MG TABS tablet Take 1 tablet (5 mg total) by mouth 2 (two) times daily. 180 tablet 0  . atorvastatin (LIPITOR) 80 MG tablet TAKE 1 TABLET BY MOUTH  DAILY (Patient taking differently: Take 80 mg by mouth daily.) 90 tablet 3  . fluticasone (FLONASE) 50 MCG/ACT nasal spray Place 2 sprays into both nostrils daily. 2 sprays in each nostrils as needed. 48 g 3  . ibuprofen (ADVIL) 100 MG tablet Take 200 mg by mouth 2 (two) times daily.    Marland Kitchen losartan-hydrochlorothiazide (HYZAAR) 100-25 MG tablet TAKE 1 TABLET BY MOUTH  DAILY (Patient taking differently: Take 1 tablet by mouth daily.) 90 tablet 3  . Multiple Vitamins-Minerals (MENS MULTIVITAMIN PO) Take 1 tablet by mouth daily.    . tamsulosin (FLOMAX) 0.4 MG CAPS capsule Take 0.4 mg by mouth at bedtime.     . metFORMIN (GLUCOPHAGE-XR) 500 MG 24 hr tablet Take 1 tablet (500 mg total) by mouth daily. 180 tablet 3   No current facility-administered medications for this visit.    Allergies  Allergen Reactions  . Lovastatin Other (See Comments)    REACTION: Foot pain  . Simvastatin Other (See Comments)    REACTION: Myalgias     Past Medical History:  Diagnosis Date  . CKD (chronic kidney disease), stage III ([place])   . Dyslipidemia   . ED (erectile dysfunction)   . History of  Guillain-Barre syndrome    per pt 1990s fron Flu Vaccine,  stated resolved with no residual  . History of nuclear stress test    08-23-2004  by dr Ron Parker--- normal without ischemia,  ef 68%  . Hyperplasia of prostate with lower urinary tract symptoms (LUTS)   . Hypertension   . NASH (nonalcoholic steatohepatitis) 2010   followed by pcp  . Prostate cancer [facility]) urologist-- dr winter;  oncologist-- dr Tammi Klippel   dx 02-26-2019,  Stage T1c,  Gleason 3+4, vol 80.1cc;  plan external radiation  . Rhinorrhea   . Type 2 diabetes mellitus ([place])     Past Surgical History:  Procedure Laterality Date  . CARPAL TUNNEL RELEASE Right 1990s  . GOLD SEED IMPLANT N/A 05/20/2019   Procedure: GOLD SEED IMPLANT;  Surgeon: Ceasar Mons, MD;  Location: [facility];  Service: Urology;  Laterality: N/A;  . HERNIA REPAIR  infant  . prostate biopsy  02-26-2019  dr winter office  . SPACE OAR INSTILLATION N/A 05/20/2019   Procedure: SPACE OAR INSTILLATION;  Surgeon: Ceasar Mons, MD;  Location[facility];  Service: Urology;  Laterality: N/A;  . TONSILLECTOMY  child    Social History   Socioeconomic History  . Marital status: Widowed    Spouse name: Not on file  . Number of children: 2  . Years of education: Not on file  . Highest education level: Not on file  Occupational History  . Occupation:  Retired  Tobacco Use  . Smoking status: Former Smoker    Packs/day: 1.00    Years: 15.00    Pack years: 15.00    Types: Cigarettes    Quit date: 11/05/1981    Years since quitting: 39.4  . Smokeless tobacco: Never Used  Vaping Use  . Vaping Use: Never used  Substance and Sexual Activity  . Alcohol use: No    Alcohol/week: 0.0 standard drinks  . Drug use: Never  . Sexual activity: Not Currently  Other Topics Concern  . Not on file  Social History Narrative   Lives alone   Drives    Social Determinants of Health   Financial Resource Strain: Not on  file  Food Insecurity: Not on file  Transportation Needs: Not on file  Physical Activity: Not on file  Stress: Not on file  Social Connections: Not on file  Intimate Partner Violence: Not on file    Family History  Problem Relation Age of Onset  . Congestive Heart Failure Mother   . Cirrhosis Father   . Cancer Neg Hx     ROS: no fevers or chills, productive cough, hemoptysis, dysphasia, odynophagia, melena, hematochezia, dysuria, hematuria, rash, seizure activity, orthopnea, PND, claudication. Remaining systems are negative.  Physical Exam:   Blood pressure 132/70, pulse 78, height 5' 8"  (1.727 m), weight 184 lb 12.8 oz (83.8 kg).  General:  Well developed/well nourished in NAD Skin warm/dry Patient not depressed No peripheral clubbing Back-normal HEENT-normal/normal eyelids Neck supple/normal carotid upstroke bilaterally; no bruits; no JVD; no thyromegaly chest - CTA/ normal expansion CV - irregular/normal S1 and S2; no murmurs, rubs or gallops;  PMI nondisplaced Abdomen -NT/ND, no HSM, no mass, + bowel sounds, no bruit 2+ femoral pulses, positive bruit Ext-1+ edema, no chords, 2+ DP Neuro-grossly nonfocal  ECG -atrial fibrillation at a rate of 78, no significant ST changes.  Personally reviewed  A/P  1 permanent atrial fibrillation-I have reviewed the patient's previous electrocardiograms.  He is noted to be in atrial fibrillation since 2018.  He is asymptomatic and therefore plan will be rate control and anticoagulation.  His heart rate is controlled on no medications.  Continue apixaban (CHADSvasc 4).  We will arrange echocardiogram to assess LV function.  2 hypertension-patient's blood pressure is controlled.  Continue present medications.  3 hyperlipidemia-continue statin.  4 bruit-schedule abdominal ultrasound to exclude aneurysm.  Kirk Ruths, MD

## 2021-03-22 ENCOUNTER — Other Ambulatory Visit: Payer: Self-pay | Admitting: Family Medicine

## 2021-03-22 ENCOUNTER — Other Ambulatory Visit: Payer: Self-pay

## 2021-03-22 ENCOUNTER — Other Ambulatory Visit (INDEPENDENT_AMBULATORY_CARE_PROVIDER_SITE_OTHER): Payer: Medicare Other

## 2021-03-22 ENCOUNTER — Other Ambulatory Visit: Payer: Self-pay | Admitting: *Deleted

## 2021-03-22 DIAGNOSIS — D696 Thrombocytopenia, unspecified: Secondary | ICD-10-CM | POA: Diagnosis not present

## 2021-03-22 LAB — CBC WITH DIFFERENTIAL/PLATELET
Basophils Absolute: 0.1 10*3/uL (ref 0.0–0.1)
Basophils Relative: 1.3 % (ref 0.0–3.0)
Eosinophils Absolute: 0.4 10*3/uL (ref 0.0–0.7)
Eosinophils Relative: 7 % — ABNORMAL HIGH (ref 0.0–5.0)
HCT: 37.3 % — ABNORMAL LOW (ref 39.0–52.0)
Hemoglobin: 12.9 g/dL — ABNORMAL LOW (ref 13.0–17.0)
Lymphocytes Relative: 18.2 % (ref 12.0–46.0)
Lymphs Abs: 0.9 10*3/uL (ref 0.7–4.0)
MCHC: 34.6 g/dL (ref 30.0–36.0)
MCV: 92.3 fl (ref 78.0–100.0)
Monocytes Absolute: 0.6 10*3/uL (ref 0.1–1.0)
Monocytes Relative: 11.2 % (ref 3.0–12.0)
Neutro Abs: 3.2 10*3/uL (ref 1.4–7.7)
Neutrophils Relative %: 62.3 % (ref 43.0–77.0)
Platelets: 182 10*3/uL (ref 150.0–400.0)
RBC: 4.05 Mil/uL — ABNORMAL LOW (ref 4.22–5.81)
RDW: 17.9 % — ABNORMAL HIGH (ref 11.5–15.5)
WBC: 5.1 10*3/uL (ref 4.0–10.5)

## 2021-03-22 MED ORDER — APIXABAN 5 MG PO TABS
5.0000 mg | ORAL_TABLET | Freq: Two times a day (BID) | ORAL | 0 refills | Status: DC
Start: 2021-03-22 — End: 2021-05-25

## 2021-03-23 ENCOUNTER — Telehealth: Payer: Self-pay | Admitting: *Deleted

## 2021-03-23 NOTE — Telephone Encounter (Signed)
Is ok for him to take one tab metformin daily?

## 2021-03-23 NOTE — Telephone Encounter (Signed)
Yes he should be on metformin. Please ask him to schedule an appointment soon to check his blood sugar.  Algis Greenhouse. [name], MD 03/23/2021 12:27 PM

## 2021-03-23 NOTE — Telephone Encounter (Signed)
Patient want to know if he needs to be back on Metformin And if he has to how many time per day  Rx refills was send to his pharmacy

## 2021-03-23 NOTE — Progress Notes (Signed)
Please inform patient of the following:  Platelets are back to normal.  Do not need to do any further testing at this point.  They were likely low related to his recent illness.

## 2021-03-24 ENCOUNTER — Other Ambulatory Visit: Payer: Self-pay

## 2021-03-24 ENCOUNTER — Ambulatory Visit: Payer: Medicare Other | Admitting: Cardiology

## 2021-03-24 ENCOUNTER — Encounter: Payer: Self-pay | Admitting: Cardiology

## 2021-03-24 ENCOUNTER — Other Ambulatory Visit: Payer: Self-pay | Admitting: *Deleted

## 2021-03-24 VITALS — BP 132/70 | HR 78 | Ht 68.0 in | Wt 184.8 lb

## 2021-03-24 DIAGNOSIS — I4821 Permanent atrial fibrillation: Secondary | ICD-10-CM

## 2021-03-24 DIAGNOSIS — R0989 Other specified symptoms and signs involving the circulatory and respiratory systems: Secondary | ICD-10-CM | POA: Diagnosis not present

## 2021-03-24 DIAGNOSIS — I152 Hypertension secondary to endocrine disorders: Secondary | ICD-10-CM | POA: Diagnosis not present

## 2021-03-24 DIAGNOSIS — E1159 Type 2 diabetes mellitus with other circulatory complications: Secondary | ICD-10-CM

## 2021-03-24 MED ORDER — METFORMIN HCL ER 500 MG PO TB24: 500.0000 mg | ORAL_TABLET | Freq: Every day | ORAL | 3 refills | Status: DC

## 2021-03-24 NOTE — Telephone Encounter (Signed)
YEs that is ok for him to take 1 tablet daily  Katoya Amato M. [name], MD 03/24/2021 10:01 AM

## 2021-03-24 NOTE — Patient Instructions (Signed)
  Testing/Procedures:  Your physician has requested that you have an echocardiogram. Echocardiography is a painless test that uses sound waves to create images of your heart. It provides your doctor with information about the size and shape of your heart and how well your heart's chambers and valves are working. This procedure takes approximately one hour. There are no restrictions for this procedure.[place] has requested that you have an abdominal aorta duplex. During this test, an ultrasound is used to evaluate the aorta. Allow 30 minutes for this exam. Do not eat after midnight the day before and avoid carbonated beverages [facility]     Follow-Up: At [facility], you and your health needs are our priority.  As part of our continuing mission to provide you with exceptional heart care, we have created designated Provider Care Teams.  These Care Teams include your primary Cardiologist (physician) and Advanced Practice Providers (APPs -  Physician Assistants and Nurse Practitioners) who all work together to provide you with the care you need, when you need it.  We recommend signing up for the patient portal called "MyChart".  Sign up information is provided on this After Visit Summary.  MyChart is used to connect with patients for Virtual Visits (Telemedicine).  Patients are able to view lab/test results, encounter notes, upcoming appointments, etc.  Non-urgent messages can be sent to your provider as well.   To learn more about what you can do with MyChart, go to [email or web address].    Your next appointment:   6 month(s)  The format for your next appointment:   In Person  Provider:   Kirk Ruths, MD

## 2021-03-24 NOTE — Telephone Encounter (Signed)
Left voice message with information  Take one tab Metformin daily, medication change on med list  If you have any question please return call

## 2021-03-28 ENCOUNTER — Encounter (HOSPITAL_COMMUNITY): Payer: Medicare Other

## 2021-03-29 ENCOUNTER — Telehealth: Payer: Self-pay

## 2021-03-29 NOTE — Telephone Encounter (Signed)
Called pharmacy, pharmacy requesting Rx verification verification done  Patient notified

## 2021-03-29 NOTE — Telephone Encounter (Signed)
.   LAST APPOINTMENT DATE: 03/23/2021   NEXT APPOINTMENT DATE:@8 /12/2020  MEDICATION:metFORMIN (GLUCOPHAGE-XR) 500 MG 24 hr tablet   PHARMACY: [facility], [place], [address]   PHARMACY IS NEEDING CLARIFICATION ON Rx.   PLEASE CALL PATIENT WHEN FINISHED

## 2021-03-31 ENCOUNTER — Other Ambulatory Visit: Payer: Self-pay

## 2021-03-31 ENCOUNTER — Ambulatory Visit (HOSPITAL_COMMUNITY)
Admission: RE | Admit: 2021-03-31 | Discharge: 2021-03-31 | Disposition: A | Discharge status: Home / Self Care | Payer: Medicare Other | Source: Ambulatory Visit | Attending: Cardiovascular Disease | Admitting: Cardiovascular Disease

## 2021-03-31 DIAGNOSIS — F17211 Nicotine dependence, cigarettes, in remission: Secondary | ICD-10-CM | POA: Diagnosis not present

## 2021-03-31 DIAGNOSIS — Z87891 Personal history of nicotine dependence: Secondary | ICD-10-CM | POA: Diagnosis not present

## 2021-03-31 DIAGNOSIS — R0989 Other specified symptoms and signs involving the circulatory and respiratory systems: Secondary | ICD-10-CM | POA: Diagnosis not present

## 2021-03-31 DIAGNOSIS — Z136 Encounter for screening for cardiovascular disorders: Secondary | ICD-10-CM | POA: Diagnosis not present

## 2021-04-02 ENCOUNTER — Emergency Department (HOSPITAL_COMMUNITY): Payer: Medicare Other

## 2021-04-02 ENCOUNTER — Encounter (HOSPITAL_COMMUNITY): Payer: Self-pay

## 2021-04-02 ENCOUNTER — Observation Stay (HOSPITAL_COMMUNITY)
Admission: EM | Admit: 2021-04-02 | Discharge: 2021-04-03 | Disposition: A | Discharge status: Home / Self Care | Payer: Medicare Other | Attending: Internal Medicine | Admitting: Internal Medicine

## 2021-04-02 ENCOUNTER — Other Ambulatory Visit: Payer: Self-pay

## 2021-04-02 DIAGNOSIS — S0181XA Laceration without foreign body of other part of head, initial encounter: Secondary | ICD-10-CM | POA: Diagnosis present

## 2021-04-02 DIAGNOSIS — N183 Chronic kidney disease, stage 3 unspecified: Secondary | ICD-10-CM | POA: Diagnosis present

## 2021-04-02 DIAGNOSIS — T1490XA Injury, unspecified, initial encounter: Secondary | ICD-10-CM

## 2021-04-02 DIAGNOSIS — D62 Acute posthemorrhagic anemia: Secondary | ICD-10-CM | POA: Diagnosis present

## 2021-04-02 DIAGNOSIS — I480 Paroxysmal atrial fibrillation: Secondary | ICD-10-CM | POA: Diagnosis present

## 2021-04-02 DIAGNOSIS — I951 Orthostatic hypotension: Secondary | ICD-10-CM

## 2021-04-02 DIAGNOSIS — R42 Dizziness and giddiness: Secondary | ICD-10-CM

## 2021-04-02 DIAGNOSIS — K8020 Calculus of gallbladder without cholecystitis without obstruction: Secondary | ICD-10-CM | POA: Diagnosis not present

## 2021-04-02 DIAGNOSIS — N1831 Chronic kidney disease, stage 3a: Secondary | ICD-10-CM | POA: Diagnosis not present

## 2021-04-02 DIAGNOSIS — Z7901 Long term (current) use of anticoagulants: Secondary | ICD-10-CM | POA: Diagnosis not present

## 2021-04-02 DIAGNOSIS — N400 Enlarged prostate without lower urinary tract symptoms: Secondary | ICD-10-CM | POA: Diagnosis not present

## 2021-04-02 DIAGNOSIS — S199XXA Unspecified injury of neck, initial encounter: Secondary | ICD-10-CM | POA: Diagnosis not present

## 2021-04-02 DIAGNOSIS — I2510 Atherosclerotic heart disease of native coronary artery without angina pectoris: Secondary | ICD-10-CM | POA: Insufficient documentation

## 2021-04-02 DIAGNOSIS — Z794 Long term (current) use of insulin: Secondary | ICD-10-CM | POA: Diagnosis not present

## 2021-04-02 DIAGNOSIS — I129 Hypertensive chronic kidney disease with stage 1 through stage 4 chronic kidney disease, or unspecified chronic kidney disease: Secondary | ICD-10-CM | POA: Insufficient documentation

## 2021-04-02 DIAGNOSIS — Z23 Encounter for immunization: Secondary | ICD-10-CM | POA: Insufficient documentation

## 2021-04-02 DIAGNOSIS — R6889 Other general symptoms and signs: Secondary | ICD-10-CM | POA: Diagnosis not present

## 2021-04-02 DIAGNOSIS — E1122 Type 2 diabetes mellitus with diabetic chronic kidney disease: Secondary | ICD-10-CM | POA: Diagnosis not present

## 2021-04-02 DIAGNOSIS — Z79899 Other long term (current) drug therapy: Secondary | ICD-10-CM | POA: Diagnosis not present

## 2021-04-02 DIAGNOSIS — M5030 Other cervical disc degeneration, unspecified cervical region: Secondary | ICD-10-CM | POA: Diagnosis not present

## 2021-04-02 DIAGNOSIS — S299XXA Unspecified injury of thorax, initial encounter: Secondary | ICD-10-CM | POA: Diagnosis not present

## 2021-04-02 DIAGNOSIS — Z20822 Contact with and (suspected) exposure to covid-19: Secondary | ICD-10-CM | POA: Insufficient documentation

## 2021-04-02 DIAGNOSIS — R609 Edema, unspecified: Secondary | ICD-10-CM | POA: Diagnosis not present

## 2021-04-02 DIAGNOSIS — I700 Atherosclerosis of aorta: Secondary | ICD-10-CM | POA: Insufficient documentation

## 2021-04-02 DIAGNOSIS — Z041 Encounter for examination and observation following transport accident: Secondary | ICD-10-CM | POA: Diagnosis not present

## 2021-04-02 DIAGNOSIS — M25561 Pain in right knee: Secondary | ICD-10-CM | POA: Diagnosis not present

## 2021-04-02 DIAGNOSIS — S8992XA Unspecified injury of left lower leg, initial encounter: Secondary | ICD-10-CM | POA: Diagnosis not present

## 2021-04-02 DIAGNOSIS — K5730 Diverticulosis of large intestine without perforation or abscess without bleeding: Secondary | ICD-10-CM | POA: Diagnosis not present

## 2021-04-02 DIAGNOSIS — S0990XA Unspecified injury of head, initial encounter: Secondary | ICD-10-CM | POA: Diagnosis not present

## 2021-04-02 DIAGNOSIS — Z743 Need for continuous supervision: Secondary | ICD-10-CM | POA: Diagnosis not present

## 2021-04-02 DIAGNOSIS — S01111A Laceration without foreign body of right eyelid and periocular area, initial encounter: Secondary | ICD-10-CM | POA: Diagnosis not present

## 2021-04-02 DIAGNOSIS — S0511XA Contusion of eyeball and orbital tissues, right eye, initial encounter: Secondary | ICD-10-CM | POA: Diagnosis not present

## 2021-04-02 LAB — COMPREHENSIVE METABOLIC PANEL
ALT: 22 U/L (ref 0–44)
AST: 31 U/L (ref 15–41)
Albumin: 3.1 g/dL — ABNORMAL LOW (ref 3.5–5.0)
Alkaline Phosphatase: 91 U/L (ref 38–126)
Anion gap: 11 (ref 5–15)
BUN: 20 mg/dL (ref 8–23)
CO2: 20 mmol/L — ABNORMAL LOW (ref 22–32)
Calcium: 8.5 mg/dL — ABNORMAL LOW (ref 8.9–10.3)
Chloride: 107 mmol/L (ref 98–111)
Creatinine, Ser: 1.59 mg/dL — ABNORMAL HIGH (ref 0.61–1.24)
GFR, Estimated: 44 mL/min — ABNORMAL LOW (ref >60)
Glucose, Bld: 208 mg/dL — ABNORMAL HIGH (ref 70–99)
Potassium: 3.9 mmol/L (ref 3.5–5.1)
Sodium: 138 mmol/L (ref 135–145)
Total Bilirubin: 0.9 mg/dL (ref 0.3–1.2)
Total Protein: 5.7 g/dL — ABNORMAL LOW (ref 6.5–8.1)

## 2021-04-02 LAB — PROTIME-INR
INR: 1.2 (ref 0.8–1.2)
Prothrombin Time: 15.7 seconds — ABNORMAL HIGH (ref 11.4–15.2)

## 2021-04-02 LAB — CBC WITH DIFFERENTIAL/PLATELET
Abs Immature Granulocytes: 0.06 10*3/uL (ref 0.00–0.07)
Basophils Absolute: 0.1 10*3/uL (ref 0.0–0.1)
Basophils Relative: 1 %
Eosinophils Absolute: 0.7 10*3/uL — ABNORMAL HIGH (ref 0.0–0.5)
Eosinophils Relative: 9 %
HCT: 37.8 % — ABNORMAL LOW (ref 39.0–52.0)
Hemoglobin: 12.6 g/dL — ABNORMAL LOW (ref 13.0–17.0)
Immature Granulocytes: 1 %
Lymphocytes Relative: 23 %
Lymphs Abs: 1.6 10*3/uL (ref 0.7–4.0)
MCH: 32 pg (ref 26.0–34.0)
MCHC: 33.3 g/dL (ref 30.0–36.0)
MCV: 95.9 fL (ref 80.0–100.0)
Monocytes Absolute: 0.9 10*3/uL (ref 0.1–1.0)
Monocytes Relative: 12 %
Neutro Abs: 3.8 10*3/uL (ref 1.7–7.7)
Neutrophils Relative %: 54 %
Platelets: 202 10*3/uL (ref 150–400)
RBC: 3.94 MIL/uL — ABNORMAL LOW (ref 4.22–5.81)
RDW: 15.9 % — ABNORMAL HIGH (ref 11.5–15.5)
WBC: 7.1 10*3/uL (ref 4.0–10.5)
nRBC: 0 % (ref 0.0–0.2)

## 2021-04-02 LAB — HEMOGLOBIN AND HEMATOCRIT, BLOOD
HCT: 32 % — ABNORMAL LOW (ref 39.0–52.0)
Hemoglobin: 10.6 g/dL — ABNORMAL LOW (ref 13.0–17.0)

## 2021-04-02 LAB — SARS CORONAVIRUS 2 (TAT 6-24 HRS): SARS Coronavirus 2: NEGATIVE

## 2021-04-02 LAB — GLUCOSE, CAPILLARY
Glucose-Capillary: 156 mg/dL — ABNORMAL HIGH (ref 70–99)
Glucose-Capillary: 144 mg/dL — ABNORMAL HIGH (ref 70–99)

## 2021-04-02 LAB — HEMOGLOBIN: Hemoglobin: 10.1 g/dL — ABNORMAL LOW (ref 13.0–17.0)

## 2021-04-02 MED ORDER — LIDOCAINE HCL 2 % IJ SOLN
20.0000 mL | Freq: Once | INTRAMUSCULAR | Status: AC
  Administered 2021-04-02: 400 mg
  Filled 2021-04-02: qty 20

## 2021-04-02 MED ORDER — ACETAMINOPHEN 325 MG PO TABS
650.0000 mg | ORAL_TABLET | Freq: Four times a day (QID) | ORAL | Status: DC | PRN
  Administered 2021-04-02: 650 mg via ORAL
  Filled 2021-04-02: qty 2

## 2021-04-02 MED ORDER — ONDANSETRON HCL 4 MG/2ML IJ SOLN: 4.0000 mg | Freq: Four times a day (QID) | INTRAMUSCULAR | Status: DC | PRN

## 2021-04-02 MED ORDER — SODIUM CHLORIDE 0.9 % IV BOLUS
500.0000 mL | Freq: Once | INTRAVENOUS | Status: AC
  Administered 2021-04-02: 500 mL via INTRAVENOUS

## 2021-04-02 MED ORDER — INSULIN ASPART 100 UNIT/ML IJ SOLN: 0.0000 [IU] | Freq: Every day | INTRAMUSCULAR | Status: DC

## 2021-04-02 MED ORDER — OXYCODONE HCL 5 MG PO TABS
5.0000 mg | ORAL_TABLET | ORAL | Status: DC | PRN
Start: 2021-04-02 — End: 2021-04-03

## 2021-04-02 MED ORDER — ALBUTEROL SULFATE (2.5 MG/3ML) 0.083% IN NEBU: 2.5000 mg | INHALATION_SOLUTION | Freq: Four times a day (QID) | RESPIRATORY_TRACT | Status: DC | PRN

## 2021-04-02 MED ORDER — TAMSULOSIN HCL 0.4 MG PO CAPS
0.4000 mg | ORAL_CAPSULE | Freq: Every day | ORAL | Status: DC
  Administered 2021-04-03: 0.4 mg via ORAL
  Filled 2021-04-02: qty 1

## 2021-04-02 MED ORDER — TETANUS-DIPHTH-ACELL PERTUSSIS 5-2.5-18.5 LF-MCG/0.5 IM SUSY
0.5000 mL | PREFILLED_SYRINGE | Freq: Once | INTRAMUSCULAR | Status: AC
  Administered 2021-04-02: 0.5 mL via INTRAMUSCULAR
  Filled 2021-04-02: qty 0.5

## 2021-04-02 MED ORDER — ATORVASTATIN CALCIUM 80 MG PO TABS
80.0000 mg | ORAL_TABLET | Freq: Every day | ORAL | Status: DC
  Administered 2021-04-02: 80 mg via ORAL
  Filled 2021-04-02: qty 1

## 2021-04-02 MED ORDER — SODIUM CHLORIDE 0.9 % IV BOLUS
1000.0000 mL | Freq: Once | INTRAVENOUS | Status: AC
Start: 2021-04-02 — End: 2021-04-02
  Administered 2021-04-02: 1000 mL via INTRAVENOUS

## 2021-04-02 MED ORDER — ONDANSETRON HCL 4 MG PO TABS: 4.0000 mg | ORAL_TABLET | Freq: Four times a day (QID) | ORAL | Status: DC | PRN

## 2021-04-02 MED ORDER — INSULIN ASPART 100 UNIT/ML IJ SOLN
0.0000 [IU] | Freq: Three times a day (TID) | INTRAMUSCULAR | Status: DC
  Administered 2021-04-03: 1 [IU] via SUBCUTANEOUS
  Administered 2021-04-03: 2 [IU] via SUBCUTANEOUS

## 2021-04-02 MED ORDER — ACETAMINOPHEN 650 MG RE SUPP: 650.0000 mg | Freq: Four times a day (QID) | RECTAL | Status: DC | PRN

## 2021-04-02 MED ORDER — CEFAZOLIN SODIUM-DEXTROSE 1-4 GM/50ML-% IV SOLN
1.0000 g | Freq: Once | INTRAVENOUS | Status: AC
  Administered 2021-04-02: 1 g via INTRAVENOUS
  Filled 2021-04-02: qty 50
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
## 2021-04-02 NOTE — ED Notes (Signed)
Informed Dr. Dina Rich of patient V/S. Dr. Speaking with patient now
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
## 2021-04-02 NOTE — ED Notes (Signed)
Attempted report x 2 

## 2021-04-02 NOTE — ED Notes (Signed)
Attempted report x1. 

## 2021-04-03 DIAGNOSIS — D62 Acute posthemorrhagic anemia: Secondary | ICD-10-CM | POA: Diagnosis not present

## 2021-04-03 DIAGNOSIS — N1831 Chronic kidney disease, stage 3a: Secondary | ICD-10-CM | POA: Diagnosis not present

## 2021-04-03 DIAGNOSIS — E1122 Type 2 diabetes mellitus with diabetic chronic kidney disease: Secondary | ICD-10-CM | POA: Diagnosis not present

## 2021-04-03 DIAGNOSIS — I480 Paroxysmal atrial fibrillation: Secondary | ICD-10-CM | POA: Diagnosis not present

## 2021-04-03 DIAGNOSIS — R42 Dizziness and giddiness: Secondary | ICD-10-CM | POA: Diagnosis not present

## 2021-04-03 DIAGNOSIS — S0181XA Laceration without foreign body of other part of head, initial encounter: Secondary | ICD-10-CM | POA: Diagnosis not present

## 2021-04-03 LAB — CBC
HCT: 27.6 % — ABNORMAL LOW (ref 39.0–52.0)
Hemoglobin: 9.3 g/dL — ABNORMAL LOW (ref 13.0–17.0)
MCH: 31.7 pg (ref 26.0–34.0)
MCHC: 33.7 g/dL (ref 30.0–36.0)
MCV: 94.2 fL (ref 80.0–100.0)
Platelets: 180 10*3/uL (ref 150–400)
RBC: 2.93 MIL/uL — ABNORMAL LOW (ref 4.22–5.81)
RDW: 16 % — ABNORMAL HIGH (ref 11.5–15.5)
WBC: 8.8 10*3/uL (ref 4.0–10.5)
nRBC: 0 % (ref 0.0–0.2)

## 2021-04-03 LAB — MAGNESIUM: Magnesium: 1.6 mg/dL — ABNORMAL LOW (ref 1.7–2.4)

## 2021-04-03 LAB — COMPREHENSIVE METABOLIC PANEL
ALT: 19 U/L (ref 0–44)
AST: 27 U/L (ref 15–41)
Albumin: 2.7 g/dL — ABNORMAL LOW (ref 3.5–5.0)
Alkaline Phosphatase: 73 U/L (ref 38–126)
Anion gap: 6 (ref 5–15)
BUN: 18 mg/dL (ref 8–23)
CO2: 24 mmol/L (ref 22–32)
Calcium: 8.2 mg/dL — ABNORMAL LOW (ref 8.9–10.3)
Chloride: 107 mmol/L (ref 98–111)
Creatinine, Ser: 1.57 mg/dL — ABNORMAL HIGH (ref 0.61–1.24)
GFR, Estimated: 45 mL/min — ABNORMAL LOW (ref >60)
Glucose, Bld: 116 mg/dL — ABNORMAL HIGH (ref 70–99)
Potassium: 3.7 mmol/L (ref 3.5–5.1)
Sodium: 137 mmol/L (ref 135–145)
Total Bilirubin: 0.5 mg/dL (ref 0.3–1.2)
Total Protein: 4.7 g/dL — ABNORMAL LOW (ref 6.5–8.1)

## 2021-04-03 LAB — GLUCOSE, CAPILLARY
Glucose-Capillary: 143 mg/dL — ABNORMAL HIGH (ref 70–99)
Glucose-Capillary: 185 mg/dL — ABNORMAL HIGH (ref 70–99)

## 2021-04-03 LAB — PHOSPHORUS: Phosphorus: 2.9 mg/dL (ref 2.5–4.6)

## 2021-04-03 MED ORDER — ELIQUIS 5 MG PO TABS: 1.0000 | ORAL_TABLET | Freq: Two times a day (BID) | ORAL | Status: DC

## 2021-04-03 MED ORDER — CEPHALEXIN 500 MG PO CAPS: 500.0000 mg | ORAL_CAPSULE | Freq: Four times a day (QID) | ORAL | 0 refills | Status: DC

## 2021-04-03 NOTE — Discharge Summary (Addendum)
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 information. The expected result is Negative.  Fact Sheet for Patients: SugarRoll.be  Fact Sheet for Healthcare Providers: https://www.woods-mathews.com/  This test is not yet approved or cleared by the [place] FDA and  has been authorized for detection and/or diagnosis of SARS-CoV-2 by FDA under an Emergency Use Authorization (EUA). This EUA will remain  in effect (meaning this test can be used) for the duration of the COVID-19 declaration under Se ction 564(b)(1) of the Act, 21 U.S.C. section 360bbb-3(b)(1), unless the authorization is terminated or revoked sooner.  Performed at [facility], [place] [address], [place], [place] [address]      Time coordinating discharge: Over 30 minutes  SIGNED:   Paitynn Mikus J [place] DO  [facility] 04/03/2021, 11:52 AM
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
## 2021-04-03 NOTE — Progress Notes (Signed)
Pt is alert and oriented. Discharge instructions/ AVs given to pt.

## 2021-04-03 NOTE — Discharge Instructions (Addendum)
Laceration Care, Adult A laceration is a cut that may go through all layers of the skin and into the tissue that is right under the skin. Some lacerations heal on their own. Others need to be closed with stitches (sutures), staples, skin adhesive strips, or skin glue. Proper care of a laceration reduces the risk for infection, helps the laceration heal better, and may prevent scarring. How to care for your laceration Wash your hands with soap and water before touching your wound or changing your bandage (dressing). If soap and water are not available, use hand sanitizer. Keep the wound clean and dry. If you were given a dressing, you should change it at least once a day, or as told by your health care provider. You should also change it if it becomes wet or dirty. If sutures or staples were used:  Keep the wound completely dry for the first 24 hours, or as told by your health care provider. After that time, you may shower or bathe. However, make sure that the wound is not soaked in water until after the sutures or staples have been removed.  Clean the wound once each day, or as told by your health care provider: ? Wash the wound with soap and water. ? Rinse the wound with water to remove all soap. ? Pat the wound dry with a clean towel. Do not rub the wound.  After cleaning the wound, apply a thin layer of antibiotic ointment as told by your health care provider. This will help prevent infection and keep the dressing from sticking to the wound.  Have the sutures or staples removed as told by your health care provider. If skin adhesive strips were used:  Do not get the skin adhesive strips wet. You may shower or bathe, but be careful to keep the wound dry.  If the wound gets wet, pat it dry with a clean towel. Do not rub the wound.  Skin adhesive strips fall off on their own. You may trim the strips as the wound heals. Do not remove skin adhesive strips that are still stuck to the wound. They  will fall off in time. If skin glue was used:  Try to keep the wound dry, but you may briefly wet it in the shower or bath. Do not soak the wound in water, such as by swimming.  After you have showered or bathed, gently pat the wound dry with a clean towel. Do not rub the wound.  Do not do any activities that will make you sweat heavily until the skin glue has fallen off on its own.  Do not apply liquid, cream, or ointment medicine to the wound while the skin glue is in place. Using those may loosen the film before the wound has healed.  If a dressing is placed over the wound, be careful not to apply tape directly over the skin glue. Doing that may cause the glue to be pulled off before the wound has healed.  Do not pick at the glue. Skin glue usually remains in place for 5-10 days and then falls off the skin. General instructions  Take over-the-counter and prescription medicines only as told by your health care provider.  If you were prescribed an antibiotic medicine or ointment, take or apply it as told by your health care provider. Do not stop using it even if your condition improves.  Do not scratch or pick at the wound.  Check your wound every day for signs of infection.  Watch for: ? Redness, swelling, or pain. ? Fluid, blood, or pus.  Raise (elevate) the injured area above the level of your heart while you are sitting or lying down for the first 24-48 hours after the laceration is repaired.  If directed, put ice on the affected area: ? Put ice in a plastic bag. ? Place a towel between your skin and the bag. ? Leave the ice on for 20 minutes, 2-3 times a day.  Keep all follow-up visits as told by your health care provider. This is important.   Contact a health care provider if:  You received a tetanus shot and you have swelling, severe pain, redness, or bleeding at the injection site.  You have a fever.  A wound that was closed breaks open.  You notice a bad smell  coming from your wound or your dressing.  You notice something coming out of the wound, such as wood or glass.  Your pain is not controlled with medicine.  You have increased redness, swelling, or pain at the site of your wound.  You have fluid, blood, or pus coming from your wound.  You need to change the dressing often due to fluid, blood, or pus that is draining from the wound.  You develop a new rash.  You develop numbness around the wound. Get help right away if:  You develop severe swelling around the wound.  Your pain suddenly increases and is severe.  You develop painful lumps near the wound or on skin anywhere else on your body.  You have a red streak going away from your wound.  The wound is on your hand or foot and you cannot properly move a finger or toe.  The wound is on your hand or foot, and you notice that your fingers or toes look pale or bluish. Summary  A laceration is a cut that may go through all layers of the skin and into the tissue that is right under the skin.  Some lacerations heal on their own. Others need to be closed with stitches (sutures), staples, skin adhesive strips, or skin glue.  Proper care of a laceration reduces the risk of infection, helps the laceration heal better, and prevents scarring. This information is not intended to replace advice given to you by your health care provider. Make sure you discuss any questions you have with your health care provider. Document Revised: 12/20/2017 Document Reviewed: 11/11/2017 Elsevier Patient Education  2021 [place]. Please go to your Primary Care Physician, an Urgent Care or return to the Emergency Department to have your staples or sutures removed 3 - 5 days from today. Facial Laceration A facial laceration is a cut on the face. You may need to see a doctor for treatment. Treatment may help the wound heal and prevent scars. What are the causes?  A car crash.  An injury when playing  sports.  An attack by a person or animal.  A fall. What are the signs or symptoms?  A cut on the face.  Bleeding.  Pain.  Swelling.  Bruises. How is this treated?  Your wound will be cleaned. This will help prevent infection.  Your wound may be closed. Your doctor will use stitches, skin glue, or skin tape strips to do this.  You may also be given medicines, such as: ? Medicines for pain. ? Medicines to prevent or treat infection (antibiotics). This might be pills or an ointment. ? A tetanus shot. Follow these instructions at home:  Follow your doctor's instructions. Ask your doctor if you have problems or questions. Caring for your wound depends on how it was closed. If you have a bandage:  Wash your hands with soap and water for at least 20 seconds before and after you change your bandage. If you cannot use soap and water, use hand sanitizer.  Change your bandage. If stitches were used:  Keep the wound clean and dry.  If you were given a bandage, change it at least one time a day, or as told by your doctor. Also change the bandage if it gets wet or dirty.  Wash the wound with soap and water two times a day, or as told by your doctor. Rinse off the soap with water. Use a clean towel to pat the wound dry.  After cleaning, put a thin layer of antibiotic ointment on the wound as told by your doctor. This helps prevent infection and keeps the bandage from sticking to the wound.  You may shower after the first 24 hours. Do not soak the wound until the stitches are taken out.  Go back to have your stitches taken out as told by your doctor.  Do not wear makeup around the wound until your doctor says it is okay.   If skin glue was used:  You may only wet your wound in the shower or bath very briefly.  After you shower or take a bath, use a clean towel to gently pat the wound dry.  Do not soak or scrub the wound.  Do not swim.  Do not do anything that makes you sweat  a lot until the skin glue has fallen off on its own.  Do not put medicines, creams, ointment, or makeup on your wound while the skin glue is in place. This may loosen the glue before your wound is healed.  Do not put tape over the skin glue if you have a bandage. This may pull off the skin glue.  Do not spend a long time in the sun or use a tanning lamp while the skin glue is on the wound.  Do not pick at the skin glue. The skin glue usually stays in place for 5-10 days. Then, it falls off the skin.   If skin tape strips were used:  Keep the wound clean and dry.  Do not let the skin tape strips get wet.  Take care to keep the wound and skin tape strips dry when you take a bath. If the wound gets wet, pat it dry with a clean towel right away.  Skin tape strips fall off on their own over time. You may trim the strips as the wound heals. Do not take off skin tape strips that are still stuck to the wound.   General instructions  Check your wound area every day for signs of infection. Check for: ? More redness, swelling, or pain. ? Fluid or blood. ? Warmth. ? Pus or a bad smell.  Take over-the-counter and prescription medicines only as told by your doctor. ? If you were prescribed an antibiotic medicine, use it as told by your doctor. Do not stop using it even if you start to feel better.  After the cut has healed, put sunscreen on the area to prevent scars. It can take a year or two for redness and scars to fade. Contact a doctor if:  You have a fever.  You have any of these signs of infection in or around your  wound: ? More redness, swelling, or pain. ? Fluid or blood. ? Warmth. ? Pus or a bad smell. Get help right away if:  You have a red streak going away from your wound. Summary  A cut on the face may need to be closed with stitches, skin glue, or skin tape strips.  Follow your doctor's instructions for wound care.  Check your wound area every day for signs of  infection, such as more redness, swelling, or pain. This information is not intended to replace advice given to you by your health care provider. Make sure you discuss any questions you have with your health care provider. Document Revised: 01/20/2020 Document Reviewed: 01/20/2020 Elsevier Patient Education  [employer].

## 2021-04-04 ENCOUNTER — Encounter: Payer: Self-pay | Admitting: Cardiology

## 2021-04-04 LAB — HEMOGLOBIN A1C
Hgb A1c MFr Bld: 6.8 % — ABNORMAL HIGH (ref 4.8–5.6)
Mean Plasma Glucose: 148 mg/dL

## 2021-04-07 ENCOUNTER — Encounter: Payer: Self-pay | Admitting: Family Medicine

## 2021-04-07 ENCOUNTER — Other Ambulatory Visit: Payer: Self-pay

## 2021-04-07 ENCOUNTER — Ambulatory Visit (INDEPENDENT_AMBULATORY_CARE_PROVIDER_SITE_OTHER): Payer: Medicare Other | Admitting: Family Medicine

## 2021-04-07 VITALS — BP 140/78 | HR 88 | Temp 98.1°F | Ht 68.0 in | Wt 185.2 lb

## 2021-04-07 DIAGNOSIS — D62 Acute posthemorrhagic anemia: Secondary | ICD-10-CM

## 2021-04-07 DIAGNOSIS — Z4802 Encounter for removal of sutures: Secondary | ICD-10-CM | POA: Diagnosis not present

## 2021-04-07 DIAGNOSIS — S81802D Unspecified open wound, left lower leg, subsequent encounter: Secondary | ICD-10-CM

## 2021-04-07 DIAGNOSIS — R55 Syncope and collapse: Secondary | ICD-10-CM

## 2021-04-07 DIAGNOSIS — I4811 Longstanding persistent atrial fibrillation: Secondary | ICD-10-CM

## 2021-04-07 DIAGNOSIS — S0181XD Laceration without foreign body of other part of head, subsequent encounter: Secondary | ICD-10-CM

## 2021-04-07 DIAGNOSIS — Z7901 Long term (current) use of anticoagulants: Secondary | ICD-10-CM

## 2021-04-07 DIAGNOSIS — S81801D Unspecified open wound, right lower leg, subsequent encounter: Secondary | ICD-10-CM

## 2021-04-07 LAB — CBC WITH DIFFERENTIAL/PLATELET
Basophils Absolute: 0.1 10*3/uL (ref 0.0–0.1)
Basophils Relative: 0.6 % (ref 0.0–3.0)
Eosinophils Absolute: 0.3 10*3/uL (ref 0.0–0.7)
Eosinophils Relative: 3.3 % (ref 0.0–5.0)
HCT: 28 % — ABNORMAL LOW (ref 39.0–52.0)
Hemoglobin: 9.7 g/dL — ABNORMAL LOW (ref 13.0–17.0)
Lymphocytes Relative: 5.4 % — ABNORMAL LOW (ref 12.0–46.0)
Lymphs Abs: 0.5 10*3/uL — ABNORMAL LOW (ref 0.7–4.0)
MCHC: 34.8 g/dL (ref 30.0–36.0)
MCV: 92 fl (ref 78.0–100.0)
Monocytes Absolute: 0.8 10*3/uL (ref 0.1–1.0)
Monocytes Relative: 8.7 % (ref 3.0–12.0)
Neutro Abs: 7.6 10*3/uL (ref 1.4–7.7)
Neutrophils Relative %: 82 % — ABNORMAL HIGH (ref 43.0–77.0)
Platelets: 248 10*3/uL (ref 150.0–400.0)
RBC: 3.04 Mil/uL — ABNORMAL LOW (ref 4.22–5.81)
RDW: 16.3 % — ABNORMAL HIGH (ref 11.5–15.5)
WBC: 9.3 10*3/uL (ref 4.0–10.5)

## 2021-04-07 NOTE — Patient Instructions (Signed)
F/u with your pcp as instructed.  I will release your lab results to you on your MyChart account with further instructions. Please reply with any questions.   Keep your wounds clean and dry and covered for the next 1-2 weeks.  Use vaseline to the wounds to keep moist as needed.  You may restart your eloquis.   Keep hydrated. Rest.  Tylenol as needed for pain. Elevate the leg to help with the swelling.
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
## 2021-04-10 ENCOUNTER — Telehealth: Payer: Self-pay

## 2021-04-10 ENCOUNTER — Encounter: Payer: Self-pay | Admitting: Family Medicine

## 2021-04-10 ENCOUNTER — Other Ambulatory Visit: Payer: Self-pay

## 2021-04-10 LAB — BASIC METABOLIC PANEL
BUN: 32 mg/dL — ABNORMAL HIGH (ref 6–23)
CO2: 25 mEq/L (ref 19–32)
Calcium: 9.4 mg/dL (ref 8.4–10.5)
Chloride: 102 mEq/L (ref 96–112)
Creatinine, Ser: 1.64 mg/dL — ABNORMAL HIGH (ref 0.40–1.50)
GFR: 39.68 mL/min — ABNORMAL LOW (ref >60.00)
Glucose, Bld: 201 mg/dL — ABNORMAL HIGH (ref 70–99)
Potassium: 4 mEq/L (ref 3.5–5.1)
Sodium: 137 mEq/L (ref 135–145)

## 2021-04-10 MED ORDER — CEPHALEXIN 500 MG PO CAPS: 500.0000 mg | ORAL_CAPSULE | Freq: Four times a day (QID) | ORAL | 0 refills | Status: AC

## 2021-04-10 NOTE — Telephone Encounter (Signed)
Dr.Parkers response sent via mychart The black area can be expected - this is an area where the skin as died. New skin will regrow into that area as scar tissue.   Do not think they need to go the ED but would like for them to come in for an office visit within 1-2 weeks to make sure it is healing like it is supposed to be.

## 2021-04-10 NOTE — Telephone Encounter (Signed)
Patient's daughter is calling in stating that she went and seen her dad yesterday and when she changes his bandages. The wound looks really black, wanting to know if it is normal or if she needs to take him to the ED.

## 2021-04-10 NOTE — Telephone Encounter (Signed)
Patient's daughter is calling in stating that she went and seen her dad yesterday and when she changes his bandages. The wound looks really black, wanting to know if it is normal or if she needs to take him to the ED.  Please advise

## 2021-04-10 NOTE — Telephone Encounter (Addendum)
  LAST APPOINTMENT DATE: 04/07/2021   NEXT APPOINTMENT DATE:@6 /27/20  MEDICATION:Cephalexin 500 mg Oral 4 times daily  [facility] Address: [address], [place], [place] [address]   Comments: Patient states he still has an infection in his leg and is needing the medication that was prescribed in the hospital. Has a days left worth of medication   Please advise

## 2021-04-10 NOTE — Telephone Encounter (Signed)
Rx sent in per Dr.Parker

## 2021-04-17 ENCOUNTER — Encounter: Payer: Self-pay | Admitting: Family Medicine

## 2021-04-17 ENCOUNTER — Other Ambulatory Visit: Payer: Self-pay

## 2021-04-17 ENCOUNTER — Ambulatory Visit (INDEPENDENT_AMBULATORY_CARE_PROVIDER_SITE_OTHER): Payer: Medicare Other | Admitting: Family Medicine

## 2021-04-17 ENCOUNTER — Ambulatory Visit (HOSPITAL_COMMUNITY)
Admission: RE | Admit: 2021-04-17 | Discharge: 2021-04-17 | Disposition: A | Discharge status: Home / Self Care | Payer: Medicare Other | Source: Ambulatory Visit | Attending: Family Medicine | Admitting: Family Medicine

## 2021-04-17 ENCOUNTER — Telehealth: Payer: Self-pay

## 2021-04-17 VITALS — BP 134/82 | HR 81 | Temp 97.7°F | Resp 15 | Ht 68.0 in | Wt 180.2 lb

## 2021-04-17 DIAGNOSIS — I4891 Unspecified atrial fibrillation: Secondary | ICD-10-CM | POA: Diagnosis not present

## 2021-04-17 DIAGNOSIS — S81802D Unspecified open wound, left lower leg, subsequent encounter: Secondary | ICD-10-CM

## 2021-04-17 DIAGNOSIS — M79662 Pain in left lower leg: Secondary | ICD-10-CM

## 2021-04-17 LAB — CBC WITH DIFFERENTIAL/PLATELET
Basophils Absolute: 0.1 10*3/uL (ref 0.0–0.1)
Basophils Relative: 0.8 % (ref 0.0–3.0)
Eosinophils Absolute: 0.4 10*3/uL (ref 0.0–0.7)
Eosinophils Relative: 4.4 % (ref 0.0–5.0)
HCT: 32.2 % — ABNORMAL LOW (ref 39.0–52.0)
Hemoglobin: 10.7 g/dL — ABNORMAL LOW (ref 13.0–17.0)
Lymphocytes Relative: 7 % — ABNORMAL LOW (ref 12.0–46.0)
Lymphs Abs: 0.7 10*3/uL (ref 0.7–4.0)
MCHC: 33.4 g/dL (ref 30.0–36.0)
MCV: 91.5 fl (ref 78.0–100.0)
Monocytes Absolute: 0.7 10*3/uL (ref 0.1–1.0)
Monocytes Relative: 7 % (ref 3.0–12.0)
Neutro Abs: 7.7 10*3/uL (ref 1.4–7.7)
Neutrophils Relative %: 80.8 % — ABNORMAL HIGH (ref 43.0–77.0)
Platelets: 407 10*3/uL — ABNORMAL HIGH (ref 150.0–400.0)
RBC: 3.51 Mil/uL — ABNORMAL LOW (ref 4.22–5.81)
RDW: 16.3 % — ABNORMAL HIGH (ref 11.5–15.5)
WBC: 9.5 10*3/uL (ref 4.0–10.5)

## 2021-04-17 MED ORDER — TRIAMCINOLONE ACETONIDE 0.1 % EX CREA: 1.0000 "application " | TOPICAL_CREAM | Freq: Two times a day (BID) | CUTANEOUS | 0 refills | Status: AC

## 2021-04-17 NOTE — Patient Instructions (Signed)
Please schedule a follow up with Dr. Yong Channel in 2-4 weeks for recheck.   We will send you to wound care to work on your leg wound.  I am sending you for an ultrasound of your leg to ensure there is no clot and I'm checking a blood count.  I will let you know if I think you need more antibiotics.  Please start a steroid cream to the red area on your leg twice a day. I have ordered it for you.   If you have any questions or concerns, please don't hesitate to send me a message via MyChart or call the office at [phone]. Thank you for visiting with [place] today! It's our pleasure caring for you.

## 2021-04-17 NOTE — Telephone Encounter (Signed)
Patient is calling in stating that the infection is still there, red and hot to the touch. No open appointments today or tomorrow at our location or surrounding locations. Also wanted a refill on cephalexin.

## 2021-04-17 NOTE — Telephone Encounter (Signed)
Looks like they saw Dr Jonni Sanger for this today. If it is healing then it is ok to wait that long. if there are any signs of worsening infection they need to be seen here sooner.  [name]. [name], MD 04/17/2021 4:23 PM
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
## 2021-04-17 NOTE — Telephone Encounter (Signed)
Wound care cannot get patient in until the 29th. Patient wanting to know what the next steps should be. Please advise

## 2021-04-18 NOTE — Telephone Encounter (Signed)
I have left a voicemail for the nurse at [facility] to return my call

## 2021-04-18 NOTE — Telephone Encounter (Signed)
Patient has been seen 04/17/21 with Dr.Andy

## 2021-04-19 ENCOUNTER — Other Ambulatory Visit: Payer: Self-pay

## 2021-04-19 ENCOUNTER — Encounter: Payer: Medicare Other | Attending: Internal Medicine | Admitting: Internal Medicine

## 2021-04-19 DIAGNOSIS — L97822 Non-pressure chronic ulcer of other part of left lower leg with fat layer exposed: Secondary | ICD-10-CM | POA: Insufficient documentation

## 2021-04-19 DIAGNOSIS — Z8616 Personal history of COVID-19: Secondary | ICD-10-CM | POA: Insufficient documentation

## 2021-04-19 DIAGNOSIS — S81802D Unspecified open wound, left lower leg, subsequent encounter: Secondary | ICD-10-CM

## 2021-04-19 DIAGNOSIS — Z7901 Long term (current) use of anticoagulants: Secondary | ICD-10-CM | POA: Insufficient documentation

## 2021-04-19 DIAGNOSIS — I480 Paroxysmal atrial fibrillation: Secondary | ICD-10-CM | POA: Diagnosis not present

## 2021-04-19 DIAGNOSIS — Z8546 Personal history of malignant neoplasm of prostate: Secondary | ICD-10-CM | POA: Insufficient documentation

## 2021-04-19 DIAGNOSIS — N183 Chronic kidney disease, stage 3 unspecified: Secondary | ICD-10-CM | POA: Diagnosis not present

## 2021-04-19 DIAGNOSIS — X58XXXA Exposure to other specified factors, initial encounter: Secondary | ICD-10-CM | POA: Insufficient documentation

## 2021-04-19 DIAGNOSIS — Z87891 Personal history of nicotine dependence: Secondary | ICD-10-CM | POA: Diagnosis not present

## 2021-04-19 DIAGNOSIS — I129 Hypertensive chronic kidney disease with stage 1 through stage 4 chronic kidney disease, or unspecified chronic kidney disease: Secondary | ICD-10-CM | POA: Insufficient documentation

## 2021-04-19 DIAGNOSIS — S01111A Laceration without foreign body of right eyelid and periocular area, initial encounter: Secondary | ICD-10-CM | POA: Diagnosis not present

## 2021-04-19 DIAGNOSIS — E11622 Type 2 diabetes mellitus with other skin ulcer: Secondary | ICD-10-CM | POA: Insufficient documentation

## 2021-04-19 DIAGNOSIS — Z923 Personal history of irradiation: Secondary | ICD-10-CM | POA: Diagnosis not present

## 2021-04-19 DIAGNOSIS — E1122 Type 2 diabetes mellitus with diabetic chronic kidney disease: Secondary | ICD-10-CM | POA: Diagnosis not present

## 2021-04-19 MED ORDER — DOXYCYCLINE HYCLATE 100 MG PO TABS: 100.0000 mg | ORAL_TABLET | Freq: Two times a day (BID) | ORAL | 0 refills | Status: AC

## 2021-04-19 NOTE — Telephone Encounter (Signed)
Please get him scheduled for the 29th. I have ordered doxycycline twice daily.  Please order home health nursing for daily dressing changes.   Please ensure he is scheduled with Dr. [name] in 1-2 weeks for recheck.   thanks

## 2021-04-19 NOTE — Telephone Encounter (Signed)
Patient seen at [facility] today.

## 2021-04-19 NOTE — Progress Notes (Signed)
PATON, CRUM ([id number]) Visit Report for 04/19/2021 Abuse/Suicide Risk Screen Details Patient Name: [name], [name] Date of Service: 04/19/2021 8:45 AM Medical Record Number: [id number] Patient Account Number: [id number] Date of Birth/Sex: [birth year] (80 y.o. M) Treating RN: Carlene Coria Primary Care Tristin Vandeusen: Dimas Chyle Other Clinician: Referring Calder Oblinger: Billey Chang Treating Cristine Daw/Extender: Tito Dine in Treatment: 0 Abuse/Suicide Risk Screen Items Answer ABUSE RISK SCREEN: Has anyone close to you tried to hurt or harm you recentlyo No Do you feel uncomfortable with anyone in your familyo No Has anyone forced you do things that you didnot want to doo No Electronic Signature(s) Signed: 04/19/2021 3:34:38 PM By: Carlene Coria RN Entered By: Carlene Coria on 04/19/2021 08:47:52 [name] ([id number]) -------------------------------------------------------------------------------- Activities of Daily Living Details Patient Name: [name] Date of Service: 04/19/2021 8:45 AM Medical Record Number: [id number] Patient Account Number: [id number] Date of Birth/Sex: 1941-02-06 (80 y.o. M) Treating RN: Carlene Coria Primary Care Johnnie Goynes: Dimas Chyle Other Clinician: Referring Edell Mesenbrink: Billey Chang Treating Dimitria Ketchum/Extender: Tito Dine in Treatment: 0 Activities of Daily Living Items Answer Activities of Daily Living (Please select one for each item) Drive Automobile Completely Able Take Medications Completely Able Use Telephone Completely Able Care for Appearance Completely Able Use Toilet Completely Able Bath / Shower Completely Able Dress Self Completely Able Feed Self Completely Able Walk Completely Able Get In / Out Bed Completely Able Housework Completely Able Prepare Meals Completely Able Handle Money Completely Able Shop for Self Completely Able Electronic Signature(s) Signed: 04/19/2021 3:34:38 PM By: Carlene Coria RN Entered  By: Carlene Coria on 04/19/2021 08:48:28 [name] ([id number]) -------------------------------------------------------------------------------- Education Screening Details Patient Name: [name] Date of Service: 04/19/2021 8:45 AM Medical Record Number: [id number] Patient Account Number: [id number] Date of Birth/Sex: 12-Jul-1941 (80 y.o. M) Treating RN: Carlene Coria Primary Care Sherese Heyward: Dimas Chyle Other Clinician: Referring Dreshaun Stene: Billey Chang Treating Chianne Byrns/Extender: Tito Dine in Treatment: 0 Primary Learner Assessed: Patient Learning Preferences/Education Level/Primary Language Learning Preference: Explanation Highest Education Level: College or Above Preferred Language: English Cognitive Barrier Language Barrier: No Translator Needed: No Memory Deficit: No Emotional Barrier: No Cultural/Religious Beliefs Affecting Medical Care: No Physical Barrier Impaired Vision: No Impaired Hearing: No Decreased Hand dexterity: No Knowledge/Comprehension Knowledge Level: Medium Comprehension Level: High Ability to understand written instructions: High Ability to understand verbal instructions: High Motivation Anxiety Level: Anxious Cooperation: Cooperative Education Importance: Acknowledges Need Interest in Health Problems: Asks Questions Perception: Coherent Willingness to Engage in Self-Management High Activities: Readiness to Engage in Self-Management High Activities: Electronic Signature(s) Signed: 04/19/2021 3:34:38 PM By: Carlene Coria RN Entered By: Carlene Coria on 04/19/2021 08:48:55 [name] ([id number]) -------------------------------------------------------------------------------- Fall Risk Assessment Details Patient Name: [name] Date of Service: 04/19/2021 8:45 AM Medical Record Number: [id number] Patient Account Number: [id number] Date of Birth/Sex: [birth year] (80 y.o. M) Treating RN: Carlene Coria Primary Care Amon Costilla:  Dimas Chyle Other Clinician: Referring Haruye Lainez: Billey Chang Treating Sruthi Maurer/Extender: Tito Dine in Treatment: 0 Fall Risk Assessment Items Have you had 2 or more falls in the last 12 monthso 0 No Have you had any fall that resulted in injury in the last 12 monthso 0 No FALLS RISK SCREEN History of falling - immediate or within 3 months 0 No Secondary diagnosis (Do you have 2 or more medical diagnoseso) 0 No Ambulatory aid None/bed rest/wheelchair/nurse 0 No Crutches/cane/walker 0 No Furniture 0 No Intravenous therapy Access/Saline/Heparin Lock 0 No Gait/Transferring Normal/ bed rest/ wheelchair 0 No Weak (short steps with or without shuffle, stooped  but able to lift head while walking, may 0 No seek support from furniture) Impaired (short steps with shuffle, may have difficulty arising from chair, head down, impaired 0 No balance) Mental Status Oriented to own ability 0 No Electronic Signature(s) Signed: 04/19/2021 3:34:38 PM By: Carlene Coria RN Entered By: Carlene Coria on 04/19/2021 08:49:05 [name] ([id number]) -------------------------------------------------------------------------------- Foot Assessment Details Patient Name: [name] Date of Service: 04/19/2021 8:45 AM Medical Record Number: [id number] Patient Account Number: [id number] Date of Birth/Sex: 1941/10/09 (80 y.o. M) Treating RN: Carlene Coria Primary Care Marly Schuld: Dimas Chyle Other Clinician: Referring Vaneta Hammontree: Billey Chang Treating Tanveer Brammer/Extender: Tito Dine in Treatment: 0 Foot Assessment Items Site Locations + = Sensation present, - = Sensation absent, C = Callus, U = Ulcer R = Redness, W = Warmth, M = Maceration, PU = Pre-ulcerative lesion F = Fissure, S = Swelling, D = Dryness Assessment Right: Left: Other Deformity: No No Prior Foot Ulcer: No No Prior Amputation: No No Charcot Joint: No No Ambulatory Status: Ambulatory Without Help Gait:  Steady Electronic Signature(s) Signed: 04/19/2021 3:34:38 PM By: Carlene Coria RN Entered By: Carlene Coria on 04/19/2021 08:55:26 [name] ([id number]) -------------------------------------------------------------------------------- Nutrition Risk Screening Details Patient Name: [name] Date of Service: 04/19/2021 8:45 AM Medical Record Number: [id number] Patient Account Number: [id number] Date of Birth/Sex: [birth year] (80 y.o. M) Treating RN: Carlene Coria Primary Care Yarelly Kuba: Dimas Chyle Other Clinician: Referring Mayan Dolney: Billey Chang Treating Mathilde Mcwherter/Extender: Tito Dine in Treatment: 0 Height (in): 68 Weight (lbs): 180 Body Mass Index (BMI): 27.4 Nutrition Risk Screening Items Score Screening NUTRITION RISK SCREEN: I have an illness or condition that made me change the kind and/or amount of food I eat 0 No I eat fewer than two meals per day 0 No I eat few fruits and vegetables, or milk products 0 No I have three or more drinks of beer, liquor or wine almost every day 0 No I have tooth or mouth problems that make it hard for me to eat 0 No I don't always have enough money to buy the food I need 0 No I eat alone most of the time 0 No I take three or more different prescribed or over-the-counter drugs a day 1 Yes Without wanting to, I have lost or gained 10 pounds in the last six months 0 No I am not always physically able to shop, cook and/or feed myself 0 No Nutrition Protocols Good Risk Protocol 0 No interventions needed Moderate Risk Protocol High Risk Proctocol Risk Level: Good Risk Score: 1 Electronic Signature(s) Signed: 04/19/2021 3:34:38 PM By: Carlene Coria RN Entered By: Carlene Coria on 04/19/2021 08:49:16

## 2021-04-19 NOTE — Telephone Encounter (Signed)
I have tried [facility], [facility], and [facility]. No one can get patient in until late this month. Can we try a STAT referral?

## 2021-04-19 NOTE — Addendum Note (Signed)
Addended by: Billey Chang on: 04/19/2021 09:03 AM   Modules accepted: Orders
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
## 2021-04-20 NOTE — Progress Notes (Signed)
[name], [name] ([id number]) Visit Report for 04/19/2021 Allergy List Details Patient Name: [name], [name] Date of Service: 04/19/2021 8:45 AM Medical Record Number: [id number] Patient Account Number: [id number] Date of Birth/Sex: 1941/03/31 (80 y.o. M) Treating RN: Carlene Coria Primary Care Daryon Remmert: Dimas Chyle Other Clinician: Referring Sabirin Baray: Billey Chang Treating Catalaya Garr/Extender: Ricard Dillon Weeks in Treatment: 0 Allergies Active Allergies No Known Allergies Allergy Notes Electronic Signature(s) Signed: 04/19/2021 3:34:38 PM By: Carlene Coria RN Entered By: Carlene Coria on 04/19/2021 08:45:00 [name] ([id number]) -------------------------------------------------------------------------------- Arrival Information Details Patient Name: [name] Date of Service: 04/19/2021 8:45 AM Medical Record Number: [id number] Patient Account Number: [id number] Date of Birth/Sex: 26-Aug-1941 (80 y.o. M) Treating RN: Carlene Coria Primary Care Avrian Delfavero: Dimas Chyle Other Clinician: Referring Hawkins Seaman: Billey Chang Treating Copelyn Widmer/Extender: Tito Dine in Treatment: 0 Visit Information Patient Arrived: Ambulatory Arrival Time: 08:42 Accompanied By: self Transfer Assistance: None Patient Identification Verified: Yes Secondary Verification Process Completed: Yes Patient Requires Transmission-Based No Precautions: Patient Has Alerts: Yes Patient Alerts: Patient on Blood Thinner DIABETIC Electronic Signature(s) Signed: 04/19/2021 3:34:38 PM By: Carlene Coria RN Entered By: Carlene Coria on 04/19/2021 08:43:38 [name] ([id number]) -------------------------------------------------------------------------------- Clinic Level of Care Assessment Details Patient Name: [name] Date of Service: 04/19/2021 8:45 AM Medical Record Number: [id number] Patient Account Number: [id number] Date of Birth/Sex: [birth year] (80 y.o. M) Treating RN: Cornell Barman Primary Care Adriene Knipfer: Dimas Chyle Other Clinician: Referring Amity Roes: Billey Chang Treating Halleigh Comes/Extender: Tito Dine in Treatment: 0 Clinic Level of Care Assessment Items TOOL 1 Quantity Score []  - Use when EandM and Procedure is performed on INITIAL visit 0 ASSESSMENTS - Nursing Assessment / Reassessment X - General Physical Exam (combine w/ comprehensive assessment (listed just below) when performed on new 1 20 pt. evals) X- 1 25 Comprehensive Assessment (HX, ROS, Risk Assessments, Wounds Hx, etc.) ASSESSMENTS - Wound and Skin Assessment / Reassessment []  - Dermatologic / Skin Assessment (not related to wound area) 0 ASSESSMENTS - Ostomy and/or Continence Assessment and Care []  - Incontinence Assessment and Management 0 []  - 0 Ostomy Care Assessment and Management (repouching, etc.) PROCESS - Coordination of Care X - Simple Patient / Family Education for ongoing care 1 15 []  - 0 Complex (extensive) Patient / Family Education for ongoing care X- 1 10 Staff obtains Consents, Records, Test Results / Process Orders []  - 0 Staff telephones HHA, Nursing Homes / Clarify orders / etc []  - 0 Routine Transfer to another Facility (non-emergent condition) []  - 0 Routine Hospital Admission (non-emergent condition) X- 1 15 New Admissions / Biomedical engineer / Ordering NPWT, Apligraf, etc. []  - 0 Emergency Hospital Admission (emergent condition) PROCESS - Special [name] []  - Pediatric / Minor Patient Management 0 []  - 0 Isolation Patient Management []  - 0 Hearing / Language / Visual special [name] []  - 0 Assessment of Community assistance (transportation, D/C planning, etc.) []  - 0 Additional assistance / Altered mentation []  - 0 Support Surface(s) Assessment (bed, cushion, seat, etc.) INTERVENTIONS - Miscellaneous []  - External ear exam 0 []  - 0 Patient Transfer (multiple staff / Civil Service fast streamer / Similar devices) []  - 0 Simple Staple / Suture  removal (25 or less) []  - 0 Complex Staple / Suture removal (26 or more) []  - 0 Hypo/Hyperglycemic Management (do not check if billed separately) X- 1 15 Ankle / Brachial Index (ABI) - do not check if billed separately Has the patient been seen at the hospital within the last three years: Yes Total Score: 100  Level Of Care: New/Established - Level 3 [name], [name] ([id number]) Electronic Signature(s) Signed: 04/19/2021 5:10:04 PM By: Gretta Cool, BSN, RN, CWS, Kim RN, BSN Entered By: Gretta Cool, BSN, RN, CWS, Kim on 04/19/2021 09:38:40 [name] ([id number]) -------------------------------------------------------------------------------- Encounter Discharge Information Details Patient Name: [name] Date of Service: 04/19/2021 8:45 AM Medical Record Number: [id number] Patient Account Number: [id number] Date of Birth/Sex: 10/26/1941 (80 y.o. M) Treating RN: Cornell Barman Primary Care Lanea Vankirk: Dimas Chyle Other Clinician: Referring Shonika Kolasinski: Billey Chang Treating Emilyann Banka/Extender: Tito Dine in Treatment: 0 Encounter Discharge Information Items Post Procedure Vitals Discharge Condition: Stable Temperature (F): 98.3 Ambulatory Status: Ambulatory Pulse (bpm): 80 Discharge Destination: Home Respiratory Rate (breaths/min): 18 Transportation: Private Auto Blood Pressure (mmHg): 126/74 Accompanied By: self Schedule Follow-up Appointment: Yes Clinical Summary of Care: Electronic Signature(s) Signed: 04/20/2021 3:50:18 PM By: Jeanine Luz Entered By: Jeanine Luz on 04/19/2021 10:01:14 [name] ([id number]) -------------------------------------------------------------------------------- Lower Extremity Assessment Details Patient Name: [name] Date of Service: 04/19/2021 8:45 AM Medical Record Number: [id number] Patient Account Number: [id number] Date of Birth/Sex: 1941-04-26 (80 y.o. M) Treating RN: Carlene Coria Primary Care Hiroto Saltzman: Dimas Chyle  Other Clinician: Referring Darilyn Storbeck: Billey Chang Treating Misha Vanoverbeke/Extender: Tito Dine in Treatment: 0 Edema Assessment Assessed: [Left: No] [Right: No] [Left: Edema] [Right: :] Calf Left: Right: Point of Measurement: 36 cm From Medial Instep 36.4 cm 35.2 cm Ankle Left: Right: Point of Measurement: 9 cm From Medial Instep 23.2 cm 23 cm Knee To Floor Left: Right: From Medial Instep 46 cm 46 cm Vascular Assessment Pulses: Dorsalis Pedis Doppler Audible: [Right:Yes] Blood Pressure: Brachial: [Left:126] [Right:126] Ankle: [Left:Dorsalis Pedis: 142 1.13] [Right:Dorsalis Pedis: 138 1.10] Electronic Signature(s) Signed: 04/19/2021 3:34:38 PM By: Carlene Coria RN Entered By: Carlene Coria on 04/19/2021 09:12:01 [name] ([id number]) -------------------------------------------------------------------------------- Multi Wound Chart Details Patient Name: [name] Date of Service: 04/19/2021 8:45 AM Medical Record Number: [id number] Patient Account Number: [id number] Date of Birth/Sex: [birth year] (80 y.o. M) Treating RN: Cornell Barman Primary Care Keneth Borg: Dimas Chyle Other Clinician: Referring [place] Dube: Billey Chang Treating Katasha Riga/Extender: Tito Dine in Treatment: 0 Vital Signs Height(in): 68 Pulse(bpm): 80 Weight(lbs): 180 Blood Pressure(mmHg): 126/74 Body Mass Index(BMI): 27 Temperature(F): 98.3 Respiratory Rate(breaths/min): 18 Photos: [N/A:N/A] Wound Location: Left, Medial Lower Leg N/A N/A Wounding Event: Trauma N/A N/A Primary Etiology: Diabetic Wound/Ulcer of the Lower N/A N/A Extremity Comorbid History: Arrhythmia, Hypertension, Type II N/A N/A Diabetes, Received Radiation Date Acquired: 04/02/2021 N/A N/A Weeks of Treatment: 0 N/A N/A Wound Status: Open N/A N/A Measurements L x W x D (cm) 6x3.5x2.2 N/A N/A Area (cm) : 16.493 N/A N/A Volume (cm) : 36.285 N/A N/A % Reduction in Area: 0.00% N/A N/A % Reduction in  Volume: 0.00% N/A N/A Starting Position 1 (o'clock): 5 Ending Position 1 (o'clock): 6 Maximum Distance 1 (cm): 2.5 Starting Position 2 (o'clock): 12 Ending Position 2 (o'clock): 2 Maximum Distance 2 (cm): 2 Undermining: Yes N/A N/A Classification: Grade 2 N/A N/A Exudate Amount: Medium N/A N/A Exudate Type: Serosanguineous N/A N/A Exudate Color: red, brown N/A N/A Wound Margin: Well defined, not attached N/A N/A Granulation Amount: None Present (0%) N/A N/A Necrotic Amount: Large (67-100%) N/A N/A Necrotic Tissue: Eschar N/A N/A Exposed Structures: Fascia: No N/A N/A Fat Layer (Subcutaneous Tissue): No Tendon: No Muscle: No Joint: No Bone: No Epithelialization: None N/A N/A Debridement: Debridement - Excisional N/A N/A Pre-procedure Verification/Time 09:25 N/A N/A Out Taken: Tissue Debrided: Blood Clots, Subcutaneous N/A N/A Level: Skin/Subcutaneous Tissue N/A N/A [name], [name] ([id number]) Debridement  Area (sq cm): 21 N/A N/A Instrument: Blade, Curette, Forceps N/A N/A Bleeding: Moderate N/A N/A Hemostasis Achieved: Pressure N/A N/A Debridement Treatment Procedure was tolerated well N/A N/A Response: Post Debridement 6x3.5x2.2 N/A N/A Measurements L x W x D (cm) Post Debridement Volume: 36.285 N/A N/A (cm) Procedures Performed: Debridement N/A N/A Treatment Notes Electronic Signature(s) Signed: 04/19/2021 4:10:05 PM By: Linton Ham MD Entered By: Linton Ham on 04/19/2021 09:39:50 [name] ([id number]) -------------------------------------------------------------------------------- Multi-Disciplinary Care Plan Details Patient Name: [name] Date of Service: 04/19/2021 8:45 AM Medical Record Number: [id number] Patient Account Number: [id number] Date of Birth/Sex: Jun 07, 1941 (80 y.o. M) Treating RN: Cornell Barman Primary Care Caliyah Sieh: Dimas Chyle Other Clinician: Referring Elma Shands: Billey Chang Treating Hilda Wexler/Extender: Tito Dine in Treatment: 0 Active Inactive Necrotic Tissue Nursing Diagnoses: Impaired tissue integrity related to necrotic/devitalized tissue Knowledge deficit related to management of necrotic/devitalized tissue Goals: Necrotic/devitalized tissue will be minimized in the wound bed Date Initiated: 04/19/2021 Target Resolution Date: 05/03/2021 Goal Status: Active Interventions: Assess patient pain level pre-, during and post procedure and prior to discharge Provide education on necrotic tissue and debridement process Treatment Activities: Enzymatic debridement : 04/19/2021 Notes: Orientation to the Wound Care Program Nursing Diagnoses: Knowledge deficit related to the wound healing center program Goals: Patient/caregiver will verbalize understanding of the [place] Program Date Initiated: 04/19/2021 Target Resolution Date: 05/10/2021 Goal Status: Active Interventions: Provide education on orientation to the wound center Notes: Wound/Skin Impairment Nursing Diagnoses: Impaired tissue integrity Knowledge deficit related to smoking impact on wound healing Goals: Patient/caregiver will verbalize understanding of skin care regimen Date Initiated: 04/19/2021 Target Resolution Date: 04/19/2021 Goal Status: Active Ulcer/skin breakdown will have a volume reduction of 30% by week 4 Date Initiated: 04/19/2021 Target Resolution Date: 05/17/2021 Goal Status: Active Interventions: Assess patient/caregiver ability to obtain necessary supplies Assess patient/caregiver ability to perform ulcer/skin care regimen upon admission and as needed Assess ulceration(s) every visit [name], [name] ([id number]) Treatment Activities: Skin care regimen initiated : 04/19/2021 Topical wound management initiated : 04/19/2021 Notes: Electronic Signature(s) Signed: 04/19/2021 5:10:04 PM By: Gretta Cool, BSN, RN, CWS, Kim RN, BSN Entered By: Gretta Cool, BSN, RN, CWS, Kim on 04/19/2021 09:30:53 [name]  ([id number]) -------------------------------------------------------------------------------- Pain Assessment Details Patient Name: [name] Date of Service: 04/19/2021 8:45 AM Medical Record Number: [id number] Patient Account Number: [id number] Date of Birth/Sex: Feb 19, 1941 (80 y.o. M) Treating RN: Carlene Coria Primary Care Joquan Lotz: Dimas Chyle Other Clinician: Referring Kasen Adduci: Billey Chang Treating Yuliya Nova/Extender: Tito Dine in Treatment: 0 Active Problems Location of Pain Severity and Description of Pain Patient Has Paino No Site Locations Pain Management and Medication Current Pain Management: Electronic Signature(s) Signed: 04/19/2021 3:34:38 PM By: Carlene Coria RN Entered By: Carlene Coria on 04/19/2021 08:43:52 [name] ([id number]) -------------------------------------------------------------------------------- Patient/Caregiver Education Details Patient Name: [name] Date of Service: 04/19/2021 8:45 AM Medical Record Number: [id number] Patient Account Number: [id number] Date of Birth/Gender: [birth year] (80 y.o. M) Treating RN: Cornell Barman Primary Care Physician: Dimas Chyle Other Clinician: Referring Physician: Billey Chang Treating Physician/Extender: Tito Dine in Treatment: 0 Education Assessment Education Provided To: Patient Education Topics Provided Welcome To [facility]: Handouts: Welcome To [facility] Methods: Demonstration, Explain/Verbal Responses: State content correctly Wound/Skin Impairment: Handouts: Caring for Your Ulcer Methods: Demonstration, Explain/Verbal Responses: State content correctly Electronic Signature(s) Signed: 04/19/2021 5:10:04 PM By: Gretta Cool, BSN, RN, CWS, Kim RN, BSN Entered By: Gretta Cool, BSN, RN, CWS, Kim on 04/19/2021 09:39:11 [name] ([id number]) -------------------------------------------------------------------------------- Wound Assessment  Details Patient Name: [name], [name] Date of Service: 04/19/2021 8:45 AM Medical Record Number: [id number] Patient Account Number: [id number] Date of Birth/Sex: [birth year] (80 y.o. M) Treating RN: Cornell Barman Primary Care Courtnay Petrilla: Dimas Chyle Other Clinician: Referring Gregorey Nabor: Billey Chang Treating Gyan Cambre/Extender: Tito Dine in Treatment: 0 Wound Status Wound Number: 1 Primary Diabetic Wound/Ulcer of the Lower Extremity Etiology: Wound Location: Left, Medial Lower Leg Wound Status: Open Wounding Event: Trauma Comorbid Arrhythmia, Hypertension, Type II Diabetes, Received Date Acquired: 04/02/2021 History: Radiation Weeks Of Treatment: 0 Clustered Wound: No Photos Wound Measurements Length: (cm) 6 % Redu Width: (cm) 3.5 % Redu Depth: (cm) 2.2 Epithe Area: (cm) 16.493 [place] Volume: (cm) 36.285 Under Loc Loc ction in Area: 0% ction in Volume: 0% lialization: None ling: No mining: Yes ation 1 Starting Position (o'clock): 5 Ending Position (o'clock): 6 Maximum Distance: (cm) 2.5 ation 2 Starting Position (o'clock): 12 Ending Position (o'clock): 2 Maximum Distance: (cm) 2 Wound Description Classification: Grade 2 Foul O Wound Margin: Well defined, not attached Slough Exudate Amount: Medium Exudate Type: Serosanguineous Exudate Color: red, brown dor After Cleansing: No /Fibrino Yes Wound Bed Granulation Amount: None Present (0%) Exposed Structure Necrotic Amount: Large (67-100%) Fascia Exposed: No Necrotic Quality: Eschar Fat Layer (Subcutaneous Tissue) Exposed: No Tendon Exposed: No Muscle Exposed: No Joint Exposed: No Bone Exposed: No [name], [name] ([id number]) Treatment Notes Wound #1 (Lower Leg) Wound Laterality: Left, Medial Cleanser Peri-Wound Care Topical Primary Dressing Silvercel 4 1/4x 4 1/4 (in/in) Discharge Instruction: Apply Silvercel 4 1/4x 4 1/4 (in/in) as instructed Secondary Dressing Xtrasorb Medium 4x5  (in/in) Discharge Instruction: Apply to wound as directed. Do not cut. Secured With Compression Wrap Profore Lite LF 3 Multilayer Compression Bandaging System Discharge Instruction: Apply 3 multi-layer wrap as prescribed. Compression Stockings Environmental education officer) Signed: 04/19/2021 5:10:04 PM By: Gretta Cool, BSN, RN, CWS, Kim RN, BSN Entered By: Gretta Cool, BSN, RN, CWS, Kim on 04/19/2021 09:33:56 [name] ([id number]) -------------------------------------------------------------------------------- [place] Details Patient Name: [name] Date of Service: 04/19/2021 8:45 AM Medical Record Number: [id number] Patient Account Number: [id number] Date of Birth/Sex: November 19, 1940 (80 y.o. M) Treating RN: Carlene Coria Primary Care Jakeline Dave: Dimas Chyle Other Clinician: Referring Courtlyn Aki: Billey Chang Treating Maricela Schreur/Extender: Tito Dine in Treatment: 0 Vital Signs Time Taken: 08:43 Temperature (F): 98.3 Height (in): 68 Pulse (bpm): 80 Source: Stated Respiratory Rate (breaths/min): 18 Weight (lbs): 180 Blood Pressure (mmHg): 126/74 Source: Stated Reference Range: 80 - 120 mg / dl Body Mass Index (BMI): 27.4 Electronic Signature(s) Signed: 04/19/2021 3:34:38 PM By: Carlene Coria RN Entered By: Carlene Coria on 04/19/2021 08:44:41

## 2021-04-24 ENCOUNTER — Ambulatory Visit (HOSPITAL_COMMUNITY): Payer: Medicare Other | Attending: Cardiology

## 2021-04-24 ENCOUNTER — Other Ambulatory Visit: Payer: Self-pay

## 2021-04-24 DIAGNOSIS — I4821 Permanent atrial fibrillation: Secondary | ICD-10-CM | POA: Insufficient documentation

## 2021-04-24 LAB — ECHOCARDIOGRAM COMPLETE
Area-P 1/2: 4.61 cm2
S' Lateral: 2.1 cm

## 2021-04-26 ENCOUNTER — Encounter: Payer: Medicare Other | Admitting: Internal Medicine

## 2021-04-26 ENCOUNTER — Other Ambulatory Visit
Admission: RE | Admit: 2021-04-26 | Discharge: 2021-04-26 | Disposition: A | Discharge status: Home / Self Care | Payer: Medicare Other | Source: Ambulatory Visit | Attending: Internal Medicine | Admitting: Internal Medicine

## 2021-04-26 ENCOUNTER — Other Ambulatory Visit: Payer: Self-pay

## 2021-04-26 DIAGNOSIS — B999 Unspecified infectious disease: Secondary | ICD-10-CM | POA: Insufficient documentation

## 2021-04-26 DIAGNOSIS — L03116 Cellulitis of left lower limb: Secondary | ICD-10-CM | POA: Insufficient documentation

## 2021-04-26 DIAGNOSIS — E1122 Type 2 diabetes mellitus with diabetic chronic kidney disease: Secondary | ICD-10-CM | POA: Diagnosis not present

## 2021-04-26 DIAGNOSIS — L97822 Non-pressure chronic ulcer of other part of left lower leg with fat layer exposed: Secondary | ICD-10-CM | POA: Diagnosis not present

## 2021-04-26 DIAGNOSIS — Z7901 Long term (current) use of anticoagulants: Secondary | ICD-10-CM | POA: Diagnosis not present

## 2021-04-26 DIAGNOSIS — I480 Paroxysmal atrial fibrillation: Secondary | ICD-10-CM | POA: Diagnosis not present

## 2021-04-26 DIAGNOSIS — I129 Hypertensive chronic kidney disease with stage 1 through stage 4 chronic kidney disease, or unspecified chronic kidney disease: Secondary | ICD-10-CM | POA: Diagnosis not present

## 2021-04-26 DIAGNOSIS — Z8616 Personal history of COVID-19: Secondary | ICD-10-CM | POA: Diagnosis not present

## 2021-04-26 DIAGNOSIS — Z87891 Personal history of nicotine dependence: Secondary | ICD-10-CM | POA: Diagnosis not present

## 2021-04-26 DIAGNOSIS — E11622 Type 2 diabetes mellitus with other skin ulcer: Secondary | ICD-10-CM | POA: Diagnosis not present

## 2021-04-26 DIAGNOSIS — S01111A Laceration without foreign body of right eyelid and periocular area, initial encounter: Secondary | ICD-10-CM | POA: Diagnosis not present

## 2021-04-26 DIAGNOSIS — Z923 Personal history of irradiation: Secondary | ICD-10-CM | POA: Diagnosis not present

## 2021-04-26 DIAGNOSIS — N183 Chronic kidney disease, stage 3 unspecified: Secondary | ICD-10-CM | POA: Diagnosis not present
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
## 2021-04-27 ENCOUNTER — Telehealth: Payer: Self-pay | Admitting: Family Medicine

## 2021-04-27 NOTE — Progress Notes (Signed)
TRAVARES, NELLES ([id number]) Visit Report for 04/26/2021 Arrival Information Details Patient Name: [name], [name] Date of Service: 04/26/2021 1:45 PM Medical Record Number: [id number] Patient Account Number: [id number] Date of Birth/Sex: 1941/06/23 (80 y.o. M) Treating RN: Dolan Amen Primary Care Markie Heffernan: Dimas Chyle Other Clinician: Referring Ahaan Zobrist: Dimas Chyle Treating Myrtie Leuthold/Extender: Tito Dine in Treatment: 1 Visit Information History Since Last Visit Added or deleted any medications: No Patient Arrived: Ambulatory Had a fall or experienced change in No Arrival Time: 13:28 activities of daily living that may affect Accompanied By: self risk of falls: Transfer Assistance: None Hospitalized since last visit: No Patient Identification Verified: Yes Pain Present Now: No Secondary Verification Process Completed: Yes Patient Requires Transmission-Based No Precautions: Patient Has Alerts: Yes Patient Alerts: Patient on Blood Thinner DIABETIC Electronic Signature(s) Signed: 04/27/2021 4:21:06 PM By: Jeanine Luz Entered By: Jeanine Luz on 04/26/2021 13:29:30 [name] ([id number]) -------------------------------------------------------------------------------- Clinic Level of Care Assessment Details Patient Name: [name] Date of Service: 04/26/2021 1:45 PM Medical Record Number: [id number] Patient Account Number: [id number] Date of Birth/Sex: November 01, 1941 (80 y.o. M) Treating RN: Dolan Amen Primary Care Cameryn Chrisley: Dimas Chyle Other Clinician: Referring Makinzi Prieur: Dimas Chyle Treating Cameran Ahmed/Extender: Tito Dine in Treatment: 1 Clinic Level of Care Assessment Items TOOL 1 Quantity Score []  - Use when EandM and Procedure is performed on INITIAL visit 0 ASSESSMENTS - Nursing Assessment / Reassessment []  - General Physical Exam (combine w/ comprehensive assessment (listed just below) when performed on new 0 pt.  evals) []  - 0 Comprehensive Assessment (HX, ROS, Risk Assessments, Wounds Hx, etc.) ASSESSMENTS - Wound and Skin Assessment / Reassessment []  - Dermatologic / Skin Assessment (not related to wound area) 0 ASSESSMENTS - Ostomy and/or Continence Assessment and Care []  - Incontinence Assessment and Management 0 []  - 0 Ostomy Care Assessment and Management (repouching, etc.) PROCESS - Coordination of Care []  - Simple Patient / Family Education for ongoing care 0 []  - 0 Complex (extensive) Patient / Family Education for ongoing care []  - 0 Staff obtains Programmer, systems, Records, Test Results / Process Orders []  - 0 Staff telephones HHA, Nursing Homes / Clarify orders / etc []  - 0 Routine Transfer to another Facility (non-emergent condition) []  - 0 Routine Hospital Admission (non-emergent condition) []  - 0 New Admissions / Biomedical engineer / Ordering NPWT, Apligraf, etc. []  - 0 Emergency Hospital Admission (emergent condition) PROCESS - Special [name] []  - Pediatric / Minor Patient Management 0 []  - 0 Isolation Patient Management []  - 0 Hearing / Language / Visual special [name] []  - 0 Assessment of Community assistance (transportation, D/C planning, etc.) []  - 0 Additional assistance / Altered mentation []  - 0 Support Surface(s) Assessment (bed, cushion, seat, etc.) INTERVENTIONS - Miscellaneous []  - External ear exam 0 []  - 0 Patient Transfer (multiple staff / Civil Service fast streamer / Similar devices) []  - 0 Simple Staple / Suture removal (25 or less) []  - 0 Complex Staple / Suture removal (26 or more) []  - 0 Hypo/Hyperglycemic Management (do not check if billed separately) []  - 0 Ankle / Brachial Index (ABI) - do not check if billed separately Has the patient been seen at the hospital within the last three years: Yes Total Score: 0 Level Of Care: ____ [name] ([id number]) Electronic Signature(s) Signed: 04/26/2021 4:33:50 PM By: Georges Mouse, Minus Breeding RN Entered By:  Georges Mouse, Minus Breeding on 04/26/2021 14:20:24 [name] ([id number]) -------------------------------------------------------------------------------- Compression Therapy Details Patient Name: [name] Date of Service: 04/26/2021 1:45 PM Medical Record Number: [id number]  Patient Account Number: [id number] Date of Birth/Sex: September 02, 1941 (80 y.o. M) Treating RN: Dolan Amen Primary Care Pasqualino Witherspoon: Dimas Chyle Other Clinician: Referring Tayvian Holycross: Dimas Chyle Treating Caid Radin/Extender: Tito Dine in Treatment: 1 Compression Therapy Performed for Wound Assessment: Wound #1 Left,Medial Lower Leg Performed By: Clinician Dolan Amen, RN Compression Type: Three Layer Pre Treatment ABI: 1.1 Post Procedure Diagnosis Same as Pre-procedure Electronic Signature(s) Signed: 04/26/2021 4:33:50 PM By: Georges Mouse, Minus Breeding RN Entered By: Georges Mouse, Minus Breeding on 04/26/2021 14:21:17 [name] ([id number]) -------------------------------------------------------------------------------- Encounter Discharge Information Details Patient Name: [name] Date of Service: 04/26/2021 1:45 PM Medical Record Number: [id number] Patient Account Number: [id number] Date of Birth/Sex: [birth year] (80 y.o. M) Treating RN: Donnamarie Poag Primary Care Shamere Dilworth: Dimas Chyle Other Clinician: Referring Delbra Zellars: Dimas Chyle Treating Denali Sharma/Extender: Tito Dine in Treatment: 1 Encounter Discharge Information Items Post Procedure Vitals Discharge Condition: Stable Temperature (F): 97.8 Ambulatory Status: Ambulatory Pulse (bpm): 63 Discharge Destination: Home Respiratory Rate (breaths/min): 18 Transportation: Private Auto Blood Pressure (mmHg): 119/41 Accompanied By: self Schedule Follow-up Appointment: Yes Clinical Summary of Care: Electronic Signature(s) Signed: 04/26/2021 4:05:24 PM By: Donnamarie Poag Entered By: Donnamarie Poag on 04/26/2021 14:31:59 [name]  ([id number]) -------------------------------------------------------------------------------- Lower Extremity Assessment Details Patient Name: [name] Date of Service: 04/26/2021 1:45 PM Medical Record Number: [id number] Patient Account Number: [id number] Date of Birth/Sex: July 08, 1941 (80 y.o. M) Treating RN: Dolan Amen Primary Care Nelva Hauk: Dimas Chyle Other Clinician: Referring Abcde Oneil: Dimas Chyle Treating Baley Lorimer/Extender: Tito Dine in Treatment: 1 Edema Assessment Assessed: [Left: Yes] [Right: Yes] Edema: [Left: Ye] [Right: s] Calf Left: Right: Point of Measurement: 36 cm From Medial Instep 33.8 cm 35.2 cm Ankle Left: Right: Point of Measurement: 9 cm From Medial Instep 22.2 cm 23 cm Vascular Assessment Pulses: Dorsalis Pedis Palpable: [Left:Yes] Electronic Signature(s) Signed: 04/26/2021 4:33:50 PM By: Georges Mouse, Minus Breeding RN Signed: 04/27/2021 4:21:06 PM By: Jeanine Luz Entered By: Jeanine Luz on 04/26/2021 13:46:33 [name] ([id number]) -------------------------------------------------------------------------------- Multi Wound Chart Details Patient Name: [name] Date of Service: 04/26/2021 1:45 PM Medical Record Number: [id number] Patient Account Number: [id number] Date of Birth/Sex: 1941-04-22 (80 y.o. M) Treating RN: Dolan Amen Primary Care Kao Conry: Dimas Chyle Other Clinician: Referring Lela Gell: Dimas Chyle Treating Priyah Schmuck/Extender: Tito Dine in Treatment: 1 Vital Signs Height(in): 68 Pulse(bpm): 78 Weight(lbs): 180 Blood Pressure(mmHg): 119/41 Body Mass Index(BMI): 27 Temperature(F): 98.5 Respiratory Rate(breaths/min): 18 Photos: [1:No Photos] [N/A:N/A] Wound Location: [1:Left, Medial Lower Leg] [N/A:N/A] Wounding Event: [1:Trauma] [N/A:N/A] Primary Etiology: [1:Diabetic Wound/Ulcer of the Lower Extremity] [N/A:N/A] Comorbid History: [1:Arrhythmia, Hypertension, Type II Diabetes,  Received Radiation] [N/A:N/A] Date Acquired: [1:04/02/2021] [N/A:N/A] Weeks of Treatment: [1:1] [N/A:N/A] Wound Status: [1:Open] [N/A:N/A] Measurements L x W x D (cm) [1:6x3.7x0.6] [N/A:N/A] Area (cm) : [1:17.436] [N/A:N/A] Volume (cm) : [1:10.462] [N/A:N/A] % Reduction in Area: [1:-5.70%] [N/A:N/A] % Reduction in Volume: [1:71.20%] [N/A:N/A] Starting Position 1 (o'clock): 3 Ending Position 1 (o'clock): [1:5] Maximum Distance 1 (cm): [1:0.6] Undermining: [1:Yes] [N/A:N/A] Classification: [1:Grade 2] [N/A:N/A] Exudate Amount: [1:Large] [N/A:N/A] Exudate Type: [1:Serosanguineous] [N/A:N/A] Exudate Color: [1:red, brown] [N/A:N/A] Wound Margin: [1:Well defined, not attached] [N/A:N/A] Granulation Amount: [1:Medium (34-66%)] [N/A:N/A] Granulation Quality: [1:Red, Pink] [N/A:N/A] Necrotic Amount: [1:Medium (34-66%)] [N/A:N/A] Exposed Structures: [1:Fat Layer (Subcutaneous Tissue): Yes Fascia: No Tendon: No Muscle: No Joint: No Bone: No] [N/A:N/A] Epithelialization: [1:None] [N/A:N/A] Debridement: [1:Debridement - Selective/Open Wound] [N/A:N/A] Pre-procedure Verification/Time 14:16 [N/A:N/A] Out Taken: Tissue Debrided: [1:Blood Clots] [N/A:N/A] Level: [1:Non-Viable Tissue] [N/A:N/A] Debridement Area (sq cm): [1:22.2] [N/A:N/A] Instrument: [1:Curette] [N/A:N/A] Bleeding: [1:Moderate] [  N/A:N/A] Hemostasis Achieved: [1:Pressure] [N/A:N/A] Debridement Treatment [1:Procedure was tolerated well] [N/A:N/A] Response: Post Debridement [1:6x3.7x0.7] [N/A:N/A] Measurements L x W x D (cm) Post Debridement Volume: [1:12.205] [N/A:N/A] (cm) Procedures Performed: [1:Debridement] [N/A:N/A] Treatment Notes Electronic Signature(s) Signed: 04/26/2021 4:26:18 PM By: Linton Ham MD Entered By: Linton Ham on 04/26/2021 14:20:45 [name] ([id number]) -------------------------------------------------------------------------------- Multi-Disciplinary Care Plan Details Patient Name:  [name] Date of Service: 04/26/2021 1:45 PM Medical Record Number: [id number] Patient Account Number: [id number] Date of Birth/Sex: 1940-12-13 (80 y.o. M) Treating RN: Dolan Amen Primary Care Merryn Thaker: Dimas Chyle Other Clinician: Referring Jakolby Sedivy: Dimas Chyle Treating Khyrin Trevathan/Extender: Tito Dine in Treatment: 1 Active Inactive Necrotic Tissue Nursing Diagnoses: Impaired tissue integrity related to necrotic/devitalized tissue Knowledge deficit related to management of necrotic/devitalized tissue Goals: Necrotic/devitalized tissue will be minimized in the wound bed Date Initiated: 04/19/2021 Target Resolution Date: 05/03/2021 Goal Status: Active Interventions: Assess patient pain level pre-, during and post procedure and prior to discharge Provide education on necrotic tissue and debridement process Treatment Activities: Enzymatic debridement : 04/19/2021 Notes: Wound/Skin Impairment Nursing Diagnoses: Impaired tissue integrity Knowledge deficit related to smoking impact on wound healing Goals: Patient/caregiver will verbalize understanding of skin care regimen Date Initiated: 04/19/2021 Target Resolution Date: 04/19/2021 Goal Status: Active Ulcer/skin breakdown will have a volume reduction of 30% by week 4 Date Initiated: 04/19/2021 Target Resolution Date: 05/17/2021 Goal Status: Active Interventions: Assess patient/caregiver ability to obtain necessary supplies Assess patient/caregiver ability to perform ulcer/skin care regimen upon admission and as needed Assess ulceration(s) every visit Treatment Activities: Skin care regimen initiated : 04/19/2021 Topical wound management initiated : 04/19/2021 Notes: Electronic Signature(s) Signed: 04/26/2021 4:33:50 PM By: Georges Mouse, Minus Breeding RN Entered By: Georges Mouse, Minus Breeding on 04/26/2021 14:15:01 [name]  ([id number]) -------------------------------------------------------------------------------- Pain Assessment Details Patient Name: [name] Date of Service: 04/26/2021 1:45 PM Medical Record Number: [id number] Patient Account Number: [id number] Date of Birth/Sex: [birth year] (80 y.o. M) Treating RN: Dolan Amen Primary Care Hansika Leaming: Dimas Chyle Other Clinician: Referring Shatima Zalar: Dimas Chyle Treating Georgiana Spillane/Extender: Tito Dine in Treatment: 1 Active Problems Location of Pain Severity and Description of Pain Patient Has Paino No Site Locations Rate the pain. Current Pain Level: 0 Pain Management and Medication Current Pain Management: Electronic Signature(s) Signed: 04/26/2021 4:33:50 PM By: Georges Mouse, Minus Breeding RN Signed: 04/27/2021 4:21:06 PM By: Jeanine Luz Entered By: Jeanine Luz on 04/26/2021 13:34:03 [name] ([id number]) -------------------------------------------------------------------------------- Patient/Caregiver Education Details Patient Name: [name] Date of Service: 04/26/2021 1:45 PM Medical Record Number: [id number] Patient Account Number: [id number] Date of Birth/Gender: 1941/06/27 (80 y.o. M) Treating RN: Dolan Amen Primary Care Physician: Dimas Chyle Other Clinician: Referring Physician: Dimas Chyle Treating Physician/Extender: Tito Dine in Treatment: 1 Education Assessment Education Provided To: Patient Education Topics Provided Wound Debridement: Methods: Explain/Verbal Responses: State content correctly Wound/Skin Impairment: Methods: Explain/Verbal Responses: State content correctly Electronic Signature(s) Signed: 04/26/2021 4:33:50 PM By: Georges Mouse, Minus Breeding RN Entered By: Georges Mouse, Minus Breeding on 04/26/2021 14:20:39 [name] ([id number]) -------------------------------------------------------------------------------- Wound Assessment Details Patient Name: [name] Date of Service: 04/26/2021 1:45 PM Medical Record Number: [id number] Patient Account Number: [id number] Date of Birth/Sex: 1941-04-03 (80 y.o. M) Treating RN: Dolan Amen Primary Care Saabir Blyth: Dimas Chyle Other Clinician: Referring Shonia Skilling: Dimas Chyle Treating Iniko Robles/Extender: Tito Dine in Treatment: 1 Wound Status Wound Number: 1 Primary Diabetic Wound/Ulcer of the Lower Extremity Etiology: Wound Location: Left, Medial Lower Leg Wound Status: Open Wounding Event: Trauma Comorbid Arrhythmia, Hypertension, Type II Diabetes, Received Date Acquired:  04/02/2021 History: Radiation Weeks Of Treatment: 1 Clustered Wound: No Wound Measurements Length: (cm) 6 Width: (cm) 3.7 Depth: (cm) 0.6 Area: (cm) 17.436 Volume: (cm) 10.462 % Reduction in Area: -5.7% % Reduction in Volume: 71.2% Epithelialization: None Undermining: Yes Starting Position (o'clock): 3 Ending Position (o'clock): 5 Maximum Distance: (cm) 0.6 Wound Description Classification: Grade 2 Wound Margin: Well defined, not attached Exudate Amount: Large Exudate Type: Serosanguineous Exudate Color: red, brown Foul Odor After Cleansing: No Slough/Fibrino Yes Wound Bed Granulation Amount: Medium (34-66%) Exposed Structure Granulation Quality: Red, Pink Fascia Exposed: No Necrotic Amount: Medium (34-66%) Fat Layer (Subcutaneous Tissue) Exposed: Yes Necrotic Quality: Adherent Slough Tendon Exposed: No Muscle Exposed: No Joint Exposed: No Bone Exposed: No Treatment Notes Wound #1 (Lower Leg) Wound Laterality: Left, Medial Cleanser Peri-Wound Care Topical Primary Dressing Silvercel 4 1/4x 4 1/4 (in/in) Discharge Instruction: Apply Silvercel 4 1/4x 4 1/4 (in/in) as instructed Secondary Dressing Xtrasorb Medium 4x5 (in/in) Discharge Instruction: Apply to wound as directed. Do not cut. Secured With Compression Wrap Profore Lite LF 3 Multilayer Compression Bandaging System DIONTRE, HARPS ([id number]) Discharge Instruction: Apply 3 multi-layer wrap as prescribed. Compression Stockings Add-Ons Electronic Signature(s) Signed: 04/26/2021 4:33:50 PM By: Georges Mouse, Minus Breeding RN Signed: 04/27/2021 4:21:06 PM By: Jeanine Luz Entered By: Jeanine Luz on 04/26/2021 13:44:34 [name] ([id number]) -------------------------------------------------------------------------------- Vitals Details Patient Name: [name] Date of Service: 04/26/2021 1:45 PM Medical Record Number: [id number] Patient Account Number: [id number] Date of Birth/Sex: 19-Mar-1941 (80 y.o. M) Treating RN: Dolan Amen Primary Care Xavion Muscat: Dimas Chyle Other Clinician: Referring Rue Valladares: Dimas Chyle Treating Ilona Colley/Extender: Tito Dine in Treatment: 1 Vital Signs Time Taken: 13:30 Temperature (F): 98.5 Height (in): 68 Pulse (bpm): 63 Weight (lbs): 180 Respiratory Rate (breaths/min): 18 Body Mass Index (BMI): 27.4 Blood Pressure (mmHg): 119/41 Reference Range: 80 - 120 mg / dl Electronic Signature(s) Signed: 04/27/2021 4:21:06 PM By: Jeanine Luz Entered By: Jeanine Luz on 04/26/2021 13:33:53

## 2021-04-27 NOTE — Telephone Encounter (Signed)
Spoke with patient req a CB tomorrow he was driving

## 2021-04-29 LAB — AEROBIC CULTURE W GRAM STAIN (SUPERFICIAL SPECIMEN)

## 2021-05-01 ENCOUNTER — Ambulatory Visit: Payer: Medicare Other | Admitting: Family Medicine

## 2021-05-03 ENCOUNTER — Encounter (HOSPITAL_BASED_OUTPATIENT_CLINIC_OR_DEPARTMENT_OTHER): Payer: Medicare Other | Admitting: Internal Medicine

## 2021-05-03 ENCOUNTER — Other Ambulatory Visit: Payer: Self-pay

## 2021-05-03 DIAGNOSIS — Z87891 Personal history of nicotine dependence: Secondary | ICD-10-CM | POA: Diagnosis not present

## 2021-05-03 DIAGNOSIS — L97822 Non-pressure chronic ulcer of other part of left lower leg with fat layer exposed: Secondary | ICD-10-CM

## 2021-05-03 DIAGNOSIS — Z923 Personal history of irradiation: Secondary | ICD-10-CM | POA: Diagnosis not present

## 2021-05-03 DIAGNOSIS — E11622 Type 2 diabetes mellitus with other skin ulcer: Secondary | ICD-10-CM | POA: Diagnosis not present

## 2021-05-03 DIAGNOSIS — E1122 Type 2 diabetes mellitus with diabetic chronic kidney disease: Secondary | ICD-10-CM | POA: Diagnosis not present

## 2021-05-03 DIAGNOSIS — Z7901 Long term (current) use of anticoagulants: Secondary | ICD-10-CM | POA: Diagnosis not present

## 2021-05-03 DIAGNOSIS — I129 Hypertensive chronic kidney disease with stage 1 through stage 4 chronic kidney disease, or unspecified chronic kidney disease: Secondary | ICD-10-CM | POA: Diagnosis not present

## 2021-05-03 DIAGNOSIS — Z8616 Personal history of COVID-19: Secondary | ICD-10-CM | POA: Diagnosis not present

## 2021-05-03 DIAGNOSIS — S01111A Laceration without foreign body of right eyelid and periocular area, initial encounter: Secondary | ICD-10-CM | POA: Diagnosis not present

## 2021-05-03 DIAGNOSIS — I480 Paroxysmal atrial fibrillation: Secondary | ICD-10-CM | POA: Diagnosis not present

## 2021-05-03 DIAGNOSIS — N183 Chronic kidney disease, stage 3 unspecified: Secondary | ICD-10-CM | POA: Diagnosis not present

## 2021-05-03 NOTE — Progress Notes (Signed)
RIDER, ERMIS ([id number]) Visit Report for 05/03/2021 Arrival Information Details Patient Name: [name], [name] Date of Service: 05/03/2021 9:30 AM Medical Record Number: [id number] Patient Account Number: [id number] Date of Birth/Sex: 09/23/1941 (80 y.o. M) Treating RN: Donnamarie Poag Primary Care Rambo Sarafian: Dimas Chyle Other Clinician: Referring Felicita Nuncio: Dimas Chyle Treating Donn Wilmot/Extender: Yaakov Guthrie in Treatment: 2 Visit Information History Since Last Visit Added or deleted any medications: No Patient Arrived: Ambulatory Had a fall or experienced change in No Arrival Time: 09:28 activities of daily living that may affect Accompanied By: self risk of falls: Transfer Assistance: None Hospitalized since last visit: No Patient Identification Verified: Yes Has Dressing in Place as Prescribed: Yes Secondary Verification Process Completed: Yes Has Compression in Place as Prescribed: Yes Patient Requires Transmission-Based No Pain Present Now: No Precautions: Patient Has Alerts: Yes Patient Alerts: Patient on Blood Thinner DIABETIC Electronic Signature(s) Signed: 05/03/2021 3:28:20 PM By: Donnamarie Poag Entered By: Donnamarie Poag on 05/03/2021 09:28:26 [name] ([id number]) -------------------------------------------------------------------------------- Clinic Level of Care Assessment Details Patient Name: [name] Date of Service: 05/03/2021 9:30 AM Medical Record Number: [id number] Patient Account Number: [id number] Date of Birth/Sex: 1941/05/29 (80 y.o. M) Treating RN: Dolan Amen Primary Care Melizza Kanode: Dimas Chyle Other Clinician: Referring Jkayla Spiewak: Dimas Chyle Treating Ladislaus Repsher/Extender: Yaakov Guthrie in Treatment: 2 Clinic Level of Care Assessment Items TOOL 1 Quantity Score []  - Use when EandM and Procedure is performed on INITIAL visit 0 ASSESSMENTS - Nursing Assessment / Reassessment []  - General Physical Exam (combine w/  comprehensive assessment (listed just below) when performed on new 0 pt. evals) []  - 0 Comprehensive Assessment (HX, ROS, Risk Assessments, Wounds Hx, etc.) ASSESSMENTS - Wound and Skin Assessment / Reassessment []  - Dermatologic / Skin Assessment (not related to wound area) 0 ASSESSMENTS - Ostomy and/or Continence Assessment and Care []  - Incontinence Assessment and Management 0 []  - 0 Ostomy Care Assessment and Management (repouching, etc.) PROCESS - Coordination of Care []  - Simple Patient / Family Education for ongoing care 0 []  - 0 Complex (extensive) Patient / Family Education for ongoing care []  - 0 Staff obtains Programmer, systems, Records, Test Results / Process Orders []  - 0 Staff telephones HHA, Nursing Homes / Clarify orders / etc []  - 0 Routine Transfer to another Facility (non-emergent condition) []  - 0 Routine Hospital Admission (non-emergent condition) []  - 0 New Admissions / Biomedical engineer / Ordering NPWT, Apligraf, etc. []  - 0 Emergency Hospital Admission (emergent condition) PROCESS - Special [name] []  - Pediatric / Minor Patient Management 0 []  - 0 Isolation Patient Management []  - 0 Hearing / Language / Visual special [name] []  - 0 Assessment of Community assistance (transportation, D/C planning, etc.) []  - 0 Additional assistance / Altered mentation []  - 0 Support Surface(s) Assessment (bed, cushion, seat, etc.) INTERVENTIONS - Miscellaneous []  - External ear exam 0 []  - 0 Patient Transfer (multiple staff / Civil Service fast streamer / Similar devices) []  - 0 Simple Staple / Suture removal (25 or less) []  - 0 Complex Staple / Suture removal (26 or more) []  - 0 Hypo/Hyperglycemic Management (do not check if billed separately) []  - 0 Ankle / Brachial Index (ABI) - do not check if billed separately Has the patient been seen at the hospital within the last three years: Yes Total Score: 0 Level Of Care: ____ [name] ([id number]) Electronic  Signature(s) Signed: 05/03/2021 12:27:32 PM By: Georges Mouse, Minus Breeding RN Entered By: Georges Mouse, Minus Breeding on 05/03/2021 10:23:20 [name] ([id number]) -------------------------------------------------------------------------------- Encounter Discharge Information Details Patient  Name: [name] Date of Service: 05/03/2021 9:30 AM Medical Record Number: [id number] Patient Account Number: [id number] Date of Birth/Sex: 05-12-1941 (80 y.o. M) Treating RN: Dolan Amen Primary Care Evanne Matsunaga: Dimas Chyle Other Clinician: Referring Chanell Nadeau: Dimas Chyle Treating Alexyia Guarino/Extender: Yaakov Guthrie in Treatment: 2 Encounter Discharge Information Items Post Procedure Vitals Discharge Condition: Stable Temperature (F): 98.2 Ambulatory Status: Ambulatory Pulse (bpm): 77 Discharge Destination: Home Respiratory Rate (breaths/min): 18 Transportation: Private Auto Blood Pressure (mmHg): 127/63 Accompanied By: self Schedule Follow-up Appointment: Yes Clinical Summary of Care: Electronic Signature(s) Signed: 05/03/2021 12:27:32 PM By: Georges Mouse, Minus Breeding RN Entered By: Georges Mouse, Minus Breeding on 05/03/2021 10:25:23 [name] ([id number]) -------------------------------------------------------------------------------- Lower Extremity Assessment Details Patient Name: [name] Date of Service: 05/03/2021 9:30 AM Medical Record Number: [id number] Patient Account Number: [id number] Date of Birth/Sex: May 19, 1941 (80 y.o. M) Treating RN: Donnamarie Poag Primary Care Deazia Lampi: Dimas Chyle Other Clinician: Referring Aubrea Meixner: Dimas Chyle Treating Gay Rape/Extender: Yaakov Guthrie in Treatment: 2 Edema Assessment Assessed: [Left: Yes] [Right: No] [Left: Edema] [Right: :] Calf Left: Right: Point of Measurement: 36 cm From Medial Instep 34 cm Ankle Left: Right: Point of Measurement: From Medial Instep 22.8 cm Vascular Assessment Pulses: Dorsalis  Pedis Palpable: [Left:Yes] Electronic Signature(s) Signed: 05/03/2021 3:28:20 PM By: Donnamarie Poag Entered By: Donnamarie Poag on 05/03/2021 09:38:54 [name] ([id number]) -------------------------------------------------------------------------------- Multi Wound Chart Details Patient Name: [name] Date of Service: 05/03/2021 9:30 AM Medical Record Number: [id number] Patient Account Number: [id number] Date of Birth/Sex: 04/27/1941 (80 y.o. M) Treating RN: Dolan Amen Primary Care Harlee Eckroth: Dimas Chyle Other Clinician: Referring Daveion Robar: Dimas Chyle Treating Jacara Benito/Extender: Yaakov Guthrie in Treatment: 2 Vital Signs Height(in): 68 Pulse(bpm): 62 Weight(lbs): 180 Blood Pressure(mmHg): 127/63 Body Mass Index(BMI): 27 Temperature(F): 98.2 Respiratory Rate(breaths/min): 18 Photos: [N/A:N/A] Wound Location: Left, Medial Lower Leg N/A N/A Wounding Event: Trauma N/A N/A Primary Etiology: Diabetic Wound/Ulcer of the Lower N/A N/A Extremity Comorbid History: Arrhythmia, Hypertension, Type II N/A N/A Diabetes, Received Radiation Date Acquired: 04/02/2021 N/A N/A Weeks of Treatment: 2 N/A N/A Wound Status: Open N/A N/A Measurements L x W x D (cm) 5.4x3.7x0.8 N/A N/A Area (cm) : 15.692 N/A N/A Volume (cm) : 12.554 N/A N/A % Reduction in Area: 4.90% N/A N/A % Reduction in Volume: 65.40% N/A N/A Starting Position 1 (o'clock): 11 Ending Position 1 (o'clock): 1 Maximum Distance 1 (cm): 0.9 Undermining: Yes N/A N/A Classification: Grade 2 N/A N/A Exudate Amount: Large N/A N/A Exudate Type: Serosanguineous N/A N/A Exudate Color: red, brown N/A N/A Wound Margin: Well defined, not attached N/A N/A Granulation Amount: Medium (34-66%) N/A N/A Granulation Quality: Red, Pink N/A N/A Necrotic Amount: Medium (34-66%) N/A N/A Exposed Structures: Fat Layer (Subcutaneous Tissue): N/A N/A Yes Fascia: No Tendon: No Muscle: No Joint: No Bone: No Epithelialization:  None N/A N/A Debridement: Debridement - Excisional N/A N/A Pre-procedure Verification/Time 10:12 N/A N/A Out Taken: Tissue Debrided: Subcutaneous, Slough N/A N/A Level: Skin/Subcutaneous Tissue N/A N/A Debridement Area (sq cm): 19.98 N/A N/A Instrument: Curette N/A N/A Bleeding: Moderate N/A N/A RHYDER, KOEGEL ([id number]) Hemostasis Achieved: Pressure N/A N/A Debridement Treatment Procedure was tolerated well N/A N/A Response: Post Debridement 5.4x3.7x0.9 N/A N/A Measurements L x W x D (cm) Post Debridement Volume: 14.123 N/A N/A (cm) Procedures Performed: Debridement N/A N/A Treatment Notes Electronic Signature(s) Signed: 05/03/2021 10:27:49 AM By: Kalman Shan DO Entered By: Kalman Shan on 05/03/2021 10:21:48 [name] ([id number]) -------------------------------------------------------------------------------- Multi-Disciplinary Care Plan Details Patient Name: [name] Date of Service: 05/03/2021 9:30 AM Medical Record Number: [id number]  Patient Account Number: [id number] Date of Birth/Sex: [birth year] (80 y.o. M) Treating RN: Dolan Amen Primary Care Famous Eisenhardt: Dimas Chyle Other Clinician: Referring Ryelynn Guedea: Dimas Chyle Treating Temara Lanum/Extender: Yaakov Guthrie in Treatment: 2 Active Inactive Necrotic Tissue Nursing Diagnoses: Impaired tissue integrity related to necrotic/devitalized tissue Knowledge deficit related to management of necrotic/devitalized tissue Goals: Necrotic/devitalized tissue will be minimized in the wound bed Date Initiated: 04/19/2021 Target Resolution Date: 05/03/2021 Goal Status: Active Interventions: Assess patient pain level pre-, during and post procedure and prior to discharge Provide education on necrotic tissue and debridement process Treatment Activities: Enzymatic debridement : 04/19/2021 Notes: Wound/Skin Impairment Nursing Diagnoses: Impaired tissue integrity Knowledge deficit related to smoking  impact on wound healing Goals: Patient/caregiver will verbalize understanding of skin care regimen Date Initiated: 04/19/2021 Target Resolution Date: 04/19/2021 Goal Status: Active Ulcer/skin breakdown will have a volume reduction of 30% by week 4 Date Initiated: 04/19/2021 Target Resolution Date: 05/17/2021 Goal Status: Active Interventions: Assess patient/caregiver ability to obtain necessary supplies Assess patient/caregiver ability to perform ulcer/skin care regimen upon admission and as needed Assess ulceration(s) every visit Treatment Activities: Skin care regimen initiated : 04/19/2021 Topical wound management initiated : 04/19/2021 Notes: Electronic Signature(s) Signed: 05/03/2021 12:27:32 PM By: Georges Mouse, Minus Breeding RN Entered By: Georges Mouse, Minus Breeding on 05/03/2021 10:12:04 [name] ([id number]) -------------------------------------------------------------------------------- Pain Assessment Details Patient Name: [name] Date of Service: 05/03/2021 9:30 AM Medical Record Number: [id number] Patient Account Number: [id number] Date of Birth/Sex: 06-03-1941 (80 y.o. M) Treating RN: Donnamarie Poag Primary Care Kirstin Kugler: Dimas Chyle Other Clinician: Referring Damyah Gugel: Dimas Chyle Treating Audryana Hockenberry/Extender: Yaakov Guthrie in Treatment: 2 Active Problems Location of Pain Severity and Description of Pain Patient Has Paino No Site Locations Rate the pain. Current Pain Level: 0 Pain Management and Medication Current Pain Management: Electronic Signature(s) Signed: 05/03/2021 3:28:20 PM By: Donnamarie Poag Entered By: Donnamarie Poag on 05/03/2021 09:31:12 [name] ([id number]) -------------------------------------------------------------------------------- Patient/Caregiver Education Details Patient Name: [name] Date of Service: 05/03/2021 9:30 AM Medical Record Number: [id number] Patient Account Number: [id number] Date of Birth/Gender: 1940-11-24 (80  y.o. M) Treating RN: Dolan Amen Primary Care Physician: Dimas Chyle Other Clinician: Referring Physician: Dimas Chyle Treating Physician/Extender: Yaakov Guthrie in Treatment: 2 Education Assessment Education Provided To: Patient Education Topics Provided Wound/Skin Impairment: Methods: Explain/Verbal Responses: State content correctly Electronic Signature(s) Signed: 05/03/2021 12:27:32 PM By: Georges Mouse, Minus Breeding RN Entered By: Georges Mouse, Minus Breeding on 05/03/2021 10:23:48 [name] ([id number]) -------------------------------------------------------------------------------- Wound Assessment Details Patient Name: [name] Date of Service: 05/03/2021 9:30 AM Medical Record Number: [id number] Patient Account Number: [id number] Date of Birth/Sex: 08-15-1941 (80 y.o. M) Treating RN: Donnamarie Poag Primary Care Roxas Clymer: Dimas Chyle Other Clinician: Referring Imya Mance: Dimas Chyle Treating Rocio Roam/Extender: Yaakov Guthrie in Treatment: 2 Wound Status Wound Number: 1 Primary Diabetic Wound/Ulcer of the Lower Extremity Etiology: Wound Location: Left, Medial Lower Leg Wound Status: Open Wounding Event: Trauma Comorbid Arrhythmia, Hypertension, Type II Diabetes, Received Date Acquired: 04/02/2021 History: Radiation Weeks Of Treatment: 2 Clustered Wound: No Photos Wound Measurements Length: (cm) 5.4 % Redu Width: (cm) 3.7 % Redu Depth: (cm) 0.8 Epithe Area: (cm) 15.692 Tunne Volume: (cm) 12.554 Under Sta End Max ction in Area: 4.9% ction in Volume: 65.4% lialization: None ling: No mining: Yes rting Position (o'clock): 11 ing Position (o'clock): 1 imum Distance: (cm) 0.9 Wound Description Classification: Grade 2 Foul O Wound Margin: Well defined, not attached Slough Exudate Amount: Large Exudate Type: Serosanguineous Exudate Color: red, brown dor After Cleansing: No /Fibrino Yes Wound Bed Granulation  Amount: Medium (34-66%)  Exposed Structure Granulation Quality: Red, Pink Fascia Exposed: No Necrotic Amount: Medium (34-66%) Fat Layer (Subcutaneous Tissue) Exposed: Yes Necrotic Quality: Adherent Slough Tendon Exposed: No Muscle Exposed: No Joint Exposed: No Bone Exposed: No Treatment Notes Wound #1 (Lower Leg) Wound Laterality: Left, Medial Cleanser ROSTON, GRUNEWALD ([id number]) Soap and Water Discharge Instruction: Gently cleanse wound with antibacterial soap, rinse and pat dry prior to dressing wounds Peri-Wound Care Desitin Maximum Strength Ointment 4 (oz) Discharge Instruction: Apply periwound Topical Santyl Collagenase Ointment, 30 (gm), tube Discharge Instruction: Apply nickel thick to wound bed Primary Dressing Hydrofera Blue Ready Transfer Foam, 4x5 (in/in) Discharge Instruction: Apply Hydrofera Blue Ready to wound bed as directed Secondary Dressing Xtrasorb Medium 4x5 (in/in) Discharge Instruction: Apply to wound as directed. Do not cut. Secured With Compression Wrap CoFlex TLC Lite 2Layer Compression System, 25 to 30 mmHg Compression Stockings Add-Ons Electronic Signature(s) Signed: 05/03/2021 3:28:20 PM By: Donnamarie Poag Entered By: Donnamarie Poag on 05/03/2021 09:38:02 [name] ([id number]) -------------------------------------------------------------------------------- [place] Details Patient Name: [name] Date of Service: 05/03/2021 9:30 AM Medical Record Number: [id number] Patient Account Number: [id number] Date of Birth/Sex: 09/18/1941 (80 y.o. M) Treating RN: Donnamarie Poag Primary Care Rylann Munford: Dimas Chyle Other Clinician: Referring Cathrine Krizan: Dimas Chyle Treating Elijahjames Fuelling/Extender: Yaakov Guthrie in Treatment: 2 Vital Signs Time Taken: 09:29 Temperature (F): 98.2 Height (in): 68 Pulse (bpm): 77 Weight (lbs): 180 Respiratory Rate (breaths/min): 18 Body Mass Index (BMI): 27.4 Blood Pressure (mmHg): 127/63 Reference Range: 80 - 120 mg / dl Electronic  Signature(s) Signed: 05/03/2021 3:28:20 PM By: Donnamarie Poag Entered ByDonnamarie Poag on 05/03/2021 09:30:56
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
## 2021-05-10 ENCOUNTER — Encounter: Payer: Medicare Other | Attending: Internal Medicine | Admitting: Internal Medicine

## 2021-05-10 ENCOUNTER — Other Ambulatory Visit: Payer: Self-pay

## 2021-05-10 DIAGNOSIS — N183 Chronic kidney disease, stage 3 unspecified: Secondary | ICD-10-CM | POA: Diagnosis not present

## 2021-05-10 DIAGNOSIS — Z8616 Personal history of COVID-19: Secondary | ICD-10-CM | POA: Diagnosis not present

## 2021-05-10 DIAGNOSIS — E1122 Type 2 diabetes mellitus with diabetic chronic kidney disease: Secondary | ICD-10-CM | POA: Insufficient documentation

## 2021-05-10 DIAGNOSIS — S8012XD Contusion of left lower leg, subsequent encounter: Secondary | ICD-10-CM | POA: Insufficient documentation

## 2021-05-10 DIAGNOSIS — M810 Age-related osteoporosis without current pathological fracture: Secondary | ICD-10-CM | POA: Diagnosis not present

## 2021-05-10 DIAGNOSIS — E11622 Type 2 diabetes mellitus with other skin ulcer: Secondary | ICD-10-CM | POA: Insufficient documentation

## 2021-05-10 DIAGNOSIS — Z923 Personal history of irradiation: Secondary | ICD-10-CM | POA: Insufficient documentation

## 2021-05-10 DIAGNOSIS — Z87891 Personal history of nicotine dependence: Secondary | ICD-10-CM | POA: Insufficient documentation

## 2021-05-10 DIAGNOSIS — I480 Paroxysmal atrial fibrillation: Secondary | ICD-10-CM | POA: Insufficient documentation

## 2021-05-10 DIAGNOSIS — L97829 Non-pressure chronic ulcer of other part of left lower leg with unspecified severity: Secondary | ICD-10-CM | POA: Diagnosis present

## 2021-05-10 DIAGNOSIS — L97822 Non-pressure chronic ulcer of other part of left lower leg with fat layer exposed: Secondary | ICD-10-CM

## 2021-05-10 DIAGNOSIS — I129 Hypertensive chronic kidney disease with stage 1 through stage 4 chronic kidney disease, or unspecified chronic kidney disease: Secondary | ICD-10-CM | POA: Diagnosis not present

## 2021-05-10 DIAGNOSIS — Z7901 Long term (current) use of anticoagulants: Secondary | ICD-10-CM | POA: Diagnosis not present

## 2021-05-10 DIAGNOSIS — Z8546 Personal history of malignant neoplasm of prostate: Secondary | ICD-10-CM | POA: Diagnosis not present
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
## 2021-05-10 NOTE — Progress Notes (Addendum)
[name], [name] ([id number]) Visit Report for 05/10/2021 Arrival Information Details Patient Name: [name] Date of Service: 05/10/2021 9:30 AM Medical Record Number: [id number] Patient Account Number: [id number] Date of Birth/Sex: [birth year] (80 y.o. M) Treating RN: Donnamarie Poag Primary Care Lauryn Lizardi: Dimas Chyle Other Clinician: Referring Reniyah Gootee: Dimas Chyle Treating Khamiyah Grefe/Extender: Yaakov Guthrie in Treatment: 3 Visit Information History Since Last Visit Added or deleted any medications: No Patient Arrived: Ambulatory Had a fall or experienced change in No Arrival Time: 09:25 activities of daily living that may affect Accompanied By: self risk of falls: Transfer Assistance: None Hospitalized since last visit: No Patient Identification Verified: Yes Has Dressing in Place as Prescribed: Yes Secondary Verification Process Completed: Yes Has Compression in Place as Prescribed: Yes Patient Requires Transmission-Based No Pain Present Now: No Precautions: Patient Has Alerts: Yes Patient Alerts: Patient on Blood Thinner DIABETIC Electronic Signature(s) Signed: 05/10/2021 10:31:39 AM By: Donnamarie Poag Entered By: Donnamarie Poag on 05/10/2021 09:25:56 [name] ([id number]) -------------------------------------------------------------------------------- Clinic Level of Care Assessment Details Patient Name: [name] Date of Service: 05/10/2021 9:30 AM Medical Record Number: [id number] Patient Account Number: [id number] Date of Birth/Sex: 1940-12-19 (80 y.o. M) Treating RN: Donnamarie Poag Primary Care George Haggart: Dimas Chyle Other Clinician: Referring Bhavik Cabiness: Dimas Chyle Treating Salena Ortlieb/Extender: Yaakov Guthrie in Treatment: 3 Clinic Level of Care Assessment Items TOOL 1 Quantity Score []  - Use when EandM and Procedure is performed on INITIAL visit 0 ASSESSMENTS - Nursing Assessment / Reassessment []  - General Physical Exam (combine w/ comprehensive  assessment (listed just below) when performed on new 0 pt. evals) []  - 0 Comprehensive Assessment (HX, ROS, Risk Assessments, Wounds Hx, etc.) ASSESSMENTS - Wound and Skin Assessment / Reassessment []  - Dermatologic / Skin Assessment (not related to wound area) 0 ASSESSMENTS - Ostomy and/or Continence Assessment and Care []  - Incontinence Assessment and Management 0 []  - 0 Ostomy Care Assessment and Management (repouching, etc.) PROCESS - Coordination of Care []  - Simple Patient / Family Education for ongoing care 0 []  - 0 Complex (extensive) Patient / Family Education for ongoing care []  - 0 Staff obtains Consents, Records, Test Results / Process Orders []  - 0 Staff telephones HHA, Nursing Homes / Clarify orders / etc []  - 0 Routine Transfer to another Facility (non-emergent condition) []  - 0 Routine Hospital Admission (non-emergent condition) []  - 0 New Admissions / Biomedical engineer / Ordering NPWT, Apligraf, etc. []  - 0 Emergency Hospital Admission (emergent condition) PROCESS - Special [name] []  - Pediatric / Minor Patient Management 0 []  - 0 Isolation Patient Management []  - 0 Hearing / Language / Visual special [name] []  - 0 Assessment of Community assistance (transportation, D/C planning, etc.) []  - 0 Additional assistance / Altered mentation []  - 0 Support Surface(s) Assessment (bed, cushion, seat, etc.) INTERVENTIONS - Miscellaneous []  - External ear exam 0 []  - 0 Patient Transfer (multiple staff / Civil Service fast streamer / Similar devices) []  - 0 Simple Staple / Suture removal (25 or less) []  - 0 Complex Staple / Suture removal (26 or more) []  - 0 Hypo/Hyperglycemic Management (do not check if billed separately) []  - 0 Ankle / Brachial Index (ABI) - do not check if billed separately Has the patient been seen at the hospital within the last three years: Yes Total Score: 0 Level Of Care: ____ [name] ([id number]) Electronic Signature(s) Signed: 05/10/2021  10:31:39 AM By: Donnamarie Poag Entered By: Donnamarie Poag on 05/10/2021 10:04:24 [name] ([id number]) -------------------------------------------------------------------------------- Encounter Discharge Information Details Patient Name: [name]  Date of Service: 05/10/2021 9:30 AM Medical Record Number: [id number] Patient Account Number: [id number] Date of Birth/Sex: [birth year] (80 y.o. M) Treating RN: Donnamarie Poag Primary Care Alcides Nutting: Dimas Chyle Other Clinician: Referring Estefanny Moler: Dimas Chyle Treating Adra Shepler/Extender: Yaakov Guthrie in Treatment: 3 Encounter Discharge Information Items Post Procedure Vitals Discharge Condition: Stable Temperature (F): 98.3 Ambulatory Status: Ambulatory Pulse (bpm): 80 Discharge Destination: Home Respiratory Rate (breaths/min): 18 Transportation: Private Auto Blood Pressure (mmHg): 136/78 Accompanied By: self Schedule Follow-up Appointment: Yes Clinical Summary of Care: Electronic Signature(s) Signed: 05/10/2021 10:31:39 AM By: Donnamarie Poag Entered By: Donnamarie Poag on 05/10/2021 10:17:51 [name] ([id number]) -------------------------------------------------------------------------------- Lower Extremity Assessment Details Patient Name: [name] Date of Service: 05/10/2021 9:30 AM Medical Record Number: [id number] Patient Account Number: [id number] Date of Birth/Sex: 1941-11-05 (80 y.o. M) Treating RN: Donnamarie Poag Primary Care Chelcie Estorga: Dimas Chyle Other Clinician: Referring Cilicia Borden: Dimas Chyle Treating Analiz Tvedt/Extender: Yaakov Guthrie in Treatment: 3 Edema Assessment Assessed: [Left: Yes] [Right: No] [Left: Edema] [Right: :] Calf Left: Right: Point of Measurement: 36 cm From Medial Instep 34 cm Ankle Left: Right: Point of Measurement: 9 cm From Medial Instep 22.8 cm Knee To Floor Left: Right: From Medial Instep 43 cm Vascular Assessment Pulses: Dorsalis Pedis Palpable:  [Left:Yes] Electronic Signature(s) Signed: 05/10/2021 10:31:39 AM By: Donnamarie Poag Entered By: Donnamarie Poag on 05/10/2021 09:37:57 [name] ([id number]) -------------------------------------------------------------------------------- Multi Wound Chart Details Patient Name: [name] Date of Service: 05/10/2021 9:30 AM Medical Record Number: [id number] Patient Account Number: [id number] Date of Birth/Sex: October 22, 1941 (80 y.o. M) Treating RN: Donnamarie Poag Primary Care Yamili Lichtenwalner: Dimas Chyle Other Clinician: Referring Zelie Asbill: Dimas Chyle Treating Dwanda Tufano/Extender: Yaakov Guthrie in Treatment: 3 Vital Signs Height(in): 68 Pulse(bpm): 80 Weight(lbs): 180 Blood Pressure(mmHg): 136/78 Body Mass Index(BMI): 27 Temperature(F): 98.3 Respiratory Rate(breaths/min): 18 Photos: [N/A:N/A] Wound Location: Left, Medial Lower Leg N/A N/A Wounding Event: Trauma N/A N/A Primary Etiology: Diabetic Wound/Ulcer of the Lower N/A N/A Extremity Comorbid History: Arrhythmia, Hypertension, Type II N/A N/A Diabetes, Received Radiation Date Acquired: 04/02/2021 N/A N/A Weeks of Treatment: 3 N/A N/A Wound Status: Open N/A N/A Measurements L x W x D (cm) 5.4x3.7x0.5 N/A N/A Area (cm) : 15.692 N/A N/A Volume (cm) : 7.846 N/A N/A % Reduction in Area: 4.90% N/A N/A % Reduction in Volume: 78.40% N/A N/A Position 1 (o'clock): 11 Maximum Distance 1 (cm): 0.5 Starting Position 1 (o'clock): 12 Ending Position 1 (o'clock): 2 Maximum Distance 1 (cm): 0.5 Starting Position 2 (o'clock): 4 Ending Position 2 (o'clock): 5 Maximum Distance 2 (cm): 0.5 Tunneling: Yes N/A N/A Undermining: Yes N/A N/A Classification: Grade 2 N/A N/A Exudate Amount: Large N/A N/A Exudate Type: Serosanguineous N/A N/A Exudate Color: red, brown N/A N/A Wound Margin: Well defined, not attached N/A N/A Granulation Amount: Medium (34-66%) N/A N/A Granulation Quality: Red, Pink N/A N/A Necrotic Amount: Medium (34-66%)  N/A N/A Exposed Structures: Fat Layer (Subcutaneous Tissue): N/A N/A Yes Fascia: No Tendon: No Muscle: No Joint: No Bone: No Epithelialization: None N/A N/A Debridement: Debridement - Excisional N/A N/A 01:00 N/A N/A [name], [name] ([id number]) Pre-procedure Verification/Time Out Taken: Tissue Debrided: Subcutaneous, Slough N/A N/A Level: Skin/Subcutaneous Tissue N/A N/A Debridement Area (sq cm): 8 N/A N/A Instrument: Curette N/A N/A Bleeding: Minimum N/A N/A Hemostasis Achieved: Pressure N/A N/A Debridement Treatment Procedure was tolerated well N/A N/A Response: Post Debridement 5.4x3.7x0.5 N/A N/A Measurements L x W x D (cm) Post Debridement Volume: 7.846 N/A N/A (cm) Procedures Performed: Debridement N/A N/A Treatment Notes Wound #1 (Lower Leg) Wound  Laterality: Left, Medial Cleanser Soap and Water Discharge Instruction: Gently cleanse wound with antibacterial soap, rinse and pat dry prior to dressing wounds Peri-Wound Care Desitin Maximum Strength Ointment 4 (oz) Discharge Instruction: GENEROUSLY Apply periwound Topical Santyl Collagenase Ointment, 30 (gm), tube Discharge Instruction: Apply nickel thick to wound bed Primary Dressing Hydrofera Blue Ready Transfer Foam, 4x5 (in/in) Discharge Instruction: use the rope for the tunnel Apply Hydrofera Blue Ready to wound bed as directed Secondary Dressing Xtrasorb Medium 4x5 (in/in) Discharge Instruction: Apply to wound as directed. Do not cut. Secured With Compression Wrap CoFlex TLC Lite 2Layer Compression System, 25 to 30 mmHg Compression Stockings Add-Ons Electronic Signature(s) Signed: 05/10/2021 11:08:56 AM By: Kalman Shan DO Previous Signature: 05/10/2021 10:31:39 AM Version By: Donnamarie Poag Entered By: Kalman Shan on 05/10/2021 11:04:23 [name] ([id number]) -------------------------------------------------------------------------------- Multi-Disciplinary Care Plan Details Patient Name:  [name] Date of Service: 05/10/2021 9:30 AM Medical Record Number: [id number] Patient Account Number: [id number] Date of Birth/Sex: Mar 08, 1941 (80 y.o. M) Treating RN: Donnamarie Poag Primary Care Tyrece Vanterpool: Dimas Chyle Other Clinician: Referring Shamone Winzer: Dimas Chyle Treating Ndea Kilroy/Extender: Yaakov Guthrie in Treatment: 3 Active Inactive Wound/Skin Impairment Nursing Diagnoses: Impaired tissue integrity Knowledge deficit related to smoking impact on wound healing Goals: Patient/caregiver will verbalize understanding of skin care regimen Date Initiated: 04/19/2021 Date Inactivated: 05/10/2021 Target Resolution Date: 04/19/2021 Goal Status: Met Ulcer/skin breakdown will have a volume reduction of 30% by week 4 Date Initiated: 04/19/2021 Target Resolution Date: 05/17/2021 Goal Status: Active Interventions: Assess patient/caregiver ability to obtain necessary supplies Assess patient/caregiver ability to perform ulcer/skin care regimen upon admission and as needed Assess ulceration(s) every visit Treatment Activities: Skin care regimen initiated : 04/19/2021 Topical wound management initiated : 04/19/2021 Notes: Electronic Signature(s) Signed: 05/10/2021 10:31:39 AM By: Donnamarie Poag Entered By: Donnamarie Poag on 05/10/2021 09:39:31 [name] ([id number]) -------------------------------------------------------------------------------- Pain Assessment Details Patient Name: [name] Date of Service: 05/10/2021 9:30 AM Medical Record Number: [id number] Patient Account Number: [id number] Date of Birth/Sex: 07-03-1941 (80 y.o. M) Treating RN: Donnamarie Poag Primary Care Siboney Requejo: Dimas Chyle Other Clinician: Referring Wilmer Santillo: Dimas Chyle Treating Manish Ruggiero/Extender: Yaakov Guthrie in Treatment: 3 Active Problems Location of Pain Severity and Description of Pain Patient Has Paino No Site Locations Rate the pain. Current Pain Level: 0 Pain Management and  Medication Current Pain Management: Electronic Signature(s) Signed: 05/10/2021 10:31:39 AM By: Donnamarie Poag Entered By: Donnamarie Poag on 05/10/2021 09:27:08 [name] ([id number]) -------------------------------------------------------------------------------- Patient/Caregiver Education Details Patient Name: [name] Date of Service: 05/10/2021 9:30 AM Medical Record Number: [id number] Patient Account Number: [id number] Date of Birth/Gender: Dec 29, 1940 (80 y.o. M) Treating RN: Donnamarie Poag Primary Care Physician: Dimas Chyle Other Clinician: Referring Physician: Dimas Chyle Treating Physician/Extender: Yaakov Guthrie in Treatment: 3 Education Assessment Education Provided To: Patient Education Topics Provided Basic Hygiene: Wound/Skin Impairment: Electronic Signature(s) Signed: 05/10/2021 10:31:39 AM By: Donnamarie Poag Entered By: Donnamarie Poag on 05/10/2021 10:04:44 [name] ([id number]) -------------------------------------------------------------------------------- Wound Assessment Details Patient Name: [name] Date of Service: 05/10/2021 9:30 AM Medical Record Number: [id number] Patient Account Number: [id number] Date of Birth/Sex: February 09, 1941 (80 y.o. M) Treating RN: Donnamarie Poag Primary Care Jacey Eckerson: Dimas Chyle Other Clinician: Referring Athalene Kolle: Dimas Chyle Treating Satish Hammers/Extender: Yaakov Guthrie in Treatment: 3 Wound Status Wound Number: 1 Primary Diabetic Wound/Ulcer of the Lower Extremity Etiology: Wound Location: Left, Medial Lower Leg Wound Status: Open Wounding Event: Trauma Comorbid Arrhythmia, Hypertension, Type II Diabetes, Received Date Acquired: 04/02/2021 History: Radiation Weeks Of Treatment: 3 Clustered Wound: No Photos Wound Measurements Length: (  cm) 5.4 % Reduc Width: (cm) 3.7 % Reduc Depth: (cm) 0.5 Epithel Area: (cm) 15.692 Tunnel Volume: (cm) 7.846 Pos Maxi tion in Area: 4.9% tion in Volume:  78.4% ialization: None ing: Yes ition (o'clock): 11 mum Distance: (cm) 0.5 Undermining: Yes Location 1 Starting Position (o'clock): 12 Ending Position (o'clock): 2 Maximum Distance: (cm) 0.5 Location 2 Starting Position (o'clock): 4 Ending Position (o'clock): 5 Maximum Distance: (cm) 0.5 Wound Description Classification: Grade 2 Foul O Wound Margin: Well defined, not attached Slough Exudate Amount: Large Exudate Type: Serosanguineous Exudate Color: red, brown dor After Cleansing: No /Fibrino Yes Wound Bed Granulation Amount: Medium (34-66%) Exposed Structure Granulation Quality: Red, Pink Fascia Exposed: No Necrotic Amount: Medium (34-66%) Fat Layer (Subcutaneous Tissue) Exposed: Yes Necrotic Quality: Adherent Slough Tendon Exposed: No Muscle Exposed: No Joint Exposed: No [name], [name] ([id number]) Bone Exposed: No Treatment Notes Wound #1 (Lower Leg) Wound Laterality: Left, Medial Cleanser Soap and Water Discharge Instruction: Gently cleanse wound with antibacterial soap, rinse and pat dry prior to dressing wounds Peri-Wound Care Desitin Maximum Strength Ointment 4 (oz) Discharge Instruction: GENEROUSLY Apply periwound Topical Santyl Collagenase Ointment, 30 (gm), tube Discharge Instruction: Apply nickel thick to wound bed Primary Dressing Hydrofera Blue Ready Transfer Foam, 4x5 (in/in) Discharge Instruction: use the rope for the tunnel Apply Hydrofera Blue Ready to wound bed as directed Secondary Dressing Xtrasorb Medium 4x5 (in/in) Discharge Instruction: Apply to wound as directed. Do not cut. Secured With Compression Wrap CoFlex TLC Lite 2Layer Compression System, 25 to 30 mmHg Compression Stockings Add-Ons Electronic Signature(s) Signed: 05/10/2021 10:31:39 AM By: Donnamarie Poag Entered By: Donnamarie Poag on 05/10/2021 09:35:08 [name] ([id number]) -------------------------------------------------------------------------------- Vitals Details Patient  Name: [name] Date of Service: 05/10/2021 9:30 AM Medical Record Number: [id number] Patient Account Number: [id number] Date of Birth/Sex: August 09, 1941 (80 y.o. M) Treating RN: Donnamarie Poag Primary Care Fardeen Steinberger: Dimas Chyle Other Clinician: Referring Merit Maybee: Dimas Chyle Treating Mardene Lessig/Extender: Yaakov Guthrie in Treatment: 3 Vital Signs Time Taken: 09:28 Temperature (F): 98.3 Height (in): 68 Pulse (bpm): 80 Weight (lbs): 180 Respiratory Rate (breaths/min): 18 Body Mass Index (BMI): 27.4 Blood Pressure (mmHg): 136/78 Reference Range: 80 - 120 mg / dl Electronic Signature(s) Signed: 05/10/2021 10:31:39 AM By: Donnamarie Poag Entered ByDonnamarie Poag on 05/10/2021 09:26:45

## 2021-05-17 ENCOUNTER — Other Ambulatory Visit: Payer: Self-pay

## 2021-05-17 ENCOUNTER — Encounter (HOSPITAL_BASED_OUTPATIENT_CLINIC_OR_DEPARTMENT_OTHER): Payer: Medicare Other | Admitting: Internal Medicine

## 2021-05-17 DIAGNOSIS — E11622 Type 2 diabetes mellitus with other skin ulcer: Secondary | ICD-10-CM | POA: Diagnosis not present

## 2021-05-17 DIAGNOSIS — Z7901 Long term (current) use of anticoagulants: Secondary | ICD-10-CM | POA: Diagnosis not present

## 2021-05-17 DIAGNOSIS — I480 Paroxysmal atrial fibrillation: Secondary | ICD-10-CM | POA: Diagnosis not present

## 2021-05-17 DIAGNOSIS — Z87891 Personal history of nicotine dependence: Secondary | ICD-10-CM | POA: Diagnosis not present

## 2021-05-17 DIAGNOSIS — I129 Hypertensive chronic kidney disease with stage 1 through stage 4 chronic kidney disease, or unspecified chronic kidney disease: Secondary | ICD-10-CM | POA: Diagnosis not present

## 2021-05-17 DIAGNOSIS — E1122 Type 2 diabetes mellitus with diabetic chronic kidney disease: Secondary | ICD-10-CM | POA: Diagnosis not present

## 2021-05-17 DIAGNOSIS — L97822 Non-pressure chronic ulcer of other part of left lower leg with fat layer exposed: Secondary | ICD-10-CM | POA: Diagnosis not present

## 2021-05-17 DIAGNOSIS — Z8616 Personal history of COVID-19: Secondary | ICD-10-CM | POA: Diagnosis not present

## 2021-05-17 DIAGNOSIS — N183 Chronic kidney disease, stage 3 unspecified: Secondary | ICD-10-CM | POA: Diagnosis not present

## 2021-05-17 DIAGNOSIS — Z923 Personal history of irradiation: Secondary | ICD-10-CM | POA: Diagnosis not present

## 2021-05-17 DIAGNOSIS — M810 Age-related osteoporosis without current pathological fracture: Secondary | ICD-10-CM | POA: Diagnosis not present

## 2021-05-17 DIAGNOSIS — S8012XD Contusion of left lower leg, subsequent encounter: Secondary | ICD-10-CM

## 2021-05-17 NOTE — Progress Notes (Addendum)
[name], [name] ([id number]) Visit Report for 05/17/2021 Arrival Information Details Patient Name: [name], [name] Date of Service: 05/17/2021 9:30 AM Medical Record Number: [id number] Patient Account Number: [id number] Date of Birth/Sex: August 02, 1941 (80 y.o. M) Treating RN: Donnamarie Poag Primary Care Nysha Koplin: Dimas Chyle Other Clinician: Referring Katherene Dinino: Dimas Chyle Treating Semira Stoltzfus/Extender: Yaakov Guthrie in Treatment: 4 Visit Information History Since Last Visit Added or deleted any medications: No Patient Arrived: Ambulatory Had a fall or experienced change in No Arrival Time: 09:32 activities of daily living that may affect Accompanied By: GIRLFRIEND risk of falls: Transfer Assistance: None Hospitalized since last visit: No Patient Requires Transmission-Based No Has Dressing in Place as Prescribed: Yes Precautions: Has Compression in Place as Prescribed: Yes Patient Has Alerts: Yes Pain Present Now: No Patient Alerts: Patient on Blood Thinner DIABETIC Electronic Signature(s) Signed: 05/17/2021 1:05:01 PM By: Donnamarie Poag Entered By: Donnamarie Poag on 05/17/2021 09:35:28 [name] ([id number]) -------------------------------------------------------------------------------- Clinic Level of Care Assessment Details Patient Name: [name] Date of Service: 05/17/2021 9:30 AM Medical Record Number: [id number] Patient Account Number: [id number] Date of Birth/Sex: [birth year] (80 y.o. M) Treating RN: Donnamarie Poag Primary Care Ugonna Keirsey: Dimas Chyle Other Clinician: Referring Autumne Kallio: Dimas Chyle Treating Tesean Stump/Extender: Yaakov Guthrie in Treatment: 4 Clinic Level of Care Assessment Items TOOL 4 Quantity Score []  - Use when only an EandM is performed on FOLLOW-UP visit 0 ASSESSMENTS - Nursing Assessment / Reassessment []  - Reassessment of Co-morbidities (includes updates in patient status) 0 []  - 0 Reassessment of Adherence to Treatment  Plan ASSESSMENTS - Wound and Skin Assessment / Reassessment X - Simple Wound Assessment / Reassessment - one wound 1 5 []  - 0 Complex Wound Assessment / Reassessment - multiple wounds []  - 0 Dermatologic / Skin Assessment (not related to wound area) ASSESSMENTS - Focused Assessment []  - Circumferential Edema Measurements - multi extremities 0 []  - 0 Nutritional Assessment / Counseling / Intervention []  - 0 Lower Extremity Assessment (monofilament, tuning fork, pulses) []  - 0 Peripheral Arterial Disease Assessment (using hand held doppler) ASSESSMENTS - Ostomy and/or Continence Assessment and Care []  - Incontinence Assessment and Management 0 []  - 0 Ostomy Care Assessment and Management (repouching, etc.) PROCESS - Coordination of Care []  - Simple Patient / Family Education for ongoing care 0 X- 1 20 Complex (extensive) Patient / Family Education for ongoing care []  - 0 Staff obtains Programmer, systems, Records, Test Results / Process Orders []  - 0 Staff telephones HHA, Nursing Homes / Clarify orders / etc []  - 0 Routine Transfer to another Facility (non-emergent condition) []  - 0 Routine Hospital Admission (non-emergent condition) []  - 0 New Admissions / Biomedical engineer / Ordering NPWT, Apligraf, etc. []  - 0 Emergency Hospital Admission (emergent condition) X- 1 10 Simple Discharge Coordination []  - 0 Complex (extensive) Discharge Coordination PROCESS - Special [name] []  - Pediatric / Minor Patient Management 0 []  - 0 Isolation Patient Management []  - 0 Hearing / Language / Visual special [name] []  - 0 Assessment of Community assistance (transportation, D/C planning, etc.) []  - 0 Additional assistance / Altered mentation []  - 0 Support Surface(s) Assessment (bed, cushion, seat, etc.) INTERVENTIONS - Wound Cleansing / Measurement EMMITTE, SURGEON ([id number]) X- 1 5 Simple Wound Cleansing - one wound []  - 0 Complex Wound Cleansing - multiple wounds []  - 0 Wound  Imaging (photographs - any number of wounds) []  - 0 Wound Tracing (instead of photographs) X- 1 5 Simple Wound Measurement - one wound []  - 0 Complex Wound Measurement - multiple  wounds INTERVENTIONS - Wound Dressings []  - Small Wound Dressing one or multiple wounds 0 X- 1 15 Medium Wound Dressing one or multiple wounds []  - 0 Large Wound Dressing one or multiple wounds []  - 0 Application of Medications - topical []  - 0 Application of Medications - injection INTERVENTIONS - Miscellaneous []  - External ear exam 0 []  - 0 Specimen Collection (cultures, biopsies, blood, body fluids, etc.) []  - 0 Specimen(s) / Culture(s) sent or taken to Lab for analysis []  - 0 Patient Transfer (multiple staff / Civil Service fast streamer / Similar devices) []  - 0 Simple Staple / Suture removal (25 or less) []  - 0 Complex Staple / Suture removal (26 or more) []  - 0 Hypo / Hyperglycemic Management (close monitor of Blood Glucose) []  - 0 Ankle / Brachial Index (ABI) - do not check if billed separately X- 1 5 Vital Signs Has the patient been seen at the hospital within the last three years: Yes Total Score: 65 Level Of Care: New/Established - Level 2 Electronic Signature(s) Signed: 05/17/2021 1:05:01 PM By: Donnamarie Poag Entered By: Donnamarie Poag on 05/17/2021 10:33:36 [name] ([id number]) -------------------------------------------------------------------------------- Encounter Discharge Information Details Patient Name: [name] Date of Service: 05/17/2021 9:30 AM Medical Record Number: [id number] Patient Account Number: [id number] Date of Birth/Sex: [birth year] (80 y.o. M) Treating RN: Donnamarie Poag Primary Care Rynlee Lisbon: Dimas Chyle Other Clinician: Referring Develle Sievers: Dimas Chyle Treating Adeliz Tonkinson/Extender: Yaakov Guthrie in Treatment: 4 Encounter Discharge Information Items Discharge Condition: Stable Ambulatory Status: Ambulatory Discharge Destination: Home Transportation:  Private Auto Accompanied By: Denman George Schedule Follow-up Appointment: Yes Clinical Summary of Care: Electronic Signature(s) Signed: 05/17/2021 1:05:01 PM By: Donnamarie Poag Entered By: Donnamarie Poag on 05/17/2021 10:43:04 [name] ([id number]) -------------------------------------------------------------------------------- Lower Extremity Assessment Details Patient Name: [name] Date of Service: 05/17/2021 9:30 AM Medical Record Number: [id number] Patient Account Number: [id number] Date of Birth/Sex: [birth year] (80 y.o. M) Treating RN: Donnamarie Poag Primary Care Madeleine Fenn: Dimas Chyle Other Clinician: Referring Audi Conover: Dimas Chyle Treating Adah Stoneberg/Extender: Yaakov Guthrie in Treatment: 4 Edema Assessment Assessed: [Left: Yes] [Right: No] [Left: Edema] [Right: :] Calf Left: Right: Point of Measurement: 36 cm From Medial Instep 33.5 cm Ankle Left: Right: Point of Measurement: 9 cm From Medial Instep 23 cm Knee To Floor Left: Right: From Medial Instep 43 cm Vascular Assessment Pulses: Dorsalis Pedis Palpable: [Left:Yes] Electronic Signature(s) Signed: 05/17/2021 1:05:01 PM By: Donnamarie Poag Entered By: Donnamarie Poag on 05/17/2021 09:47:25 [name] ([id number]) -------------------------------------------------------------------------------- Multi Wound Chart Details Patient Name: [name] Date of Service: 05/17/2021 9:30 AM Medical Record Number: [id number] Patient Account Number: [id number] Date of Birth/Sex: 05/21/1941 (80 y.o. M) Treating RN: Donnamarie Poag Primary Care Chawn Spraggins: Dimas Chyle Other Clinician: Referring Arabela Basaldua: Dimas Chyle Treating Cristo Ausburn/Extender: Yaakov Guthrie in Treatment: 4 Vital Signs Height(in): 68 Pulse(bpm): 75 Weight(lbs): 180 Blood Pressure(mmHg): 148/71 Body Mass Index(BMI): 27 Temperature(F): 98.1 Respiratory Rate(breaths/min): 18 Photos: [N/A:N/A] Wound Location: Left, Medial Lower Leg N/A  N/A Wounding Event: Trauma N/A N/A Primary Etiology: Diabetic Wound/Ulcer of the Lower N/A N/A Extremity Comorbid History: Arrhythmia, Hypertension, Type II N/A N/A Diabetes, Received Radiation Date Acquired: 04/02/2021 N/A N/A Weeks of Treatment: 4 N/A N/A Wound Status: Open N/A N/A Measurements L x W x D (cm) 5.3x3.5x0.5 N/A N/A Area (cm) : 14.569 N/A N/A Volume (cm) : 7.285 N/A N/A % Reduction in Area: 11.70% N/A N/A % Reduction in Volume: 79.90% N/A N/A Position 1 (o'clock): 11 Maximum Distance 1 (cm): 0.4 Starting Position 1 (o'clock): 12 Ending Position 1 (o'clock):  4 Maximum Distance 1 (cm): 0.3 Starting Position 2 (o'clock): 10 Ending Position 2 (o'clock): 10 Maximum Distance 2 (cm): 0.3 Tunneling: Yes N/A N/A Undermining: Yes N/A N/A Classification: Grade 2 N/A N/A Exudate Amount: Large N/A N/A Exudate Type: Serosanguineous N/A N/A Exudate Color: red, brown N/A N/A Wound Margin: Well defined, not attached N/A N/A Granulation Amount: Large (67-100%) N/A N/A Granulation Quality: Red, Pink, Friable N/A N/A Necrotic Amount: Small (1-33%) N/A N/A Exposed Structures: Fat Layer (Subcutaneous Tissue): N/A N/A Yes Fascia: No Tendon: No Muscle: No Joint: No Bone: No Epithelialization: Small (1-33%) N/A N/A TRAYSHAWN, DURKIN ([id number]) Treatment Notes Wound #1 (Lower Leg) Wound Laterality: Left, Medial Cleanser Soap and Water Discharge Instruction: Gently cleanse wound with antibacterial soap, rinse and pat dry prior to dressing wounds Peri-Wound Care Desitin Maximum Strength Ointment 4 (oz) Discharge Instruction: GENEROUSLY Apply periwound Topical Santyl Collagenase Ointment, 30 (gm), tube Discharge Instruction: Apply nickel thick to wound bed Primary Dressing Hydrofera Blue Ready Transfer Foam, 4x5 (in/in) Discharge Instruction: use the rope for the tunnel Apply Hydrofera Blue Ready to wound bed as directed Secondary Dressing ABD Pad 5x9 (in/in) Discharge  Instruction: Cover with ABD pad Kerlix 4.5 x 4.1 (in/yd) Discharge Instruction: OVER ABD PAD, Apply Kerlix 4.5 x 4.1 (in/yd) as instructed Secured With Coban Cohesive Bandage 4x5 (yds) Stretched Discharge Instruction: Apply Coban as directed AS TOP LAYER Compression Wrap Compression Stockings Add-Ons Electronic Signature(s) Signed: 05/17/2021 11:43:08 AM By: Kalman Shan DO Entered By: Kalman Shan on 05/17/2021 11:32:11 [name] ([id number]) -------------------------------------------------------------------------------- Multi-Disciplinary Care Plan Details Patient Name: [name] Date of Service: 05/17/2021 9:30 AM Medical Record Number: [id number] Patient Account Number: [id number] Date of Birth/Sex: 03/22/1941 (80 y.o. M) Treating RN: Donnamarie Poag Primary Care Crespin Forstrom: Dimas Chyle Other Clinician: Referring Denim Start: Dimas Chyle Treating Sabra Sessler/Extender: Yaakov Guthrie in Treatment: 4 Active Inactive Wound/Skin Impairment Nursing Diagnoses: Impaired tissue integrity Knowledge deficit related to smoking impact on wound healing Goals: Patient/caregiver will verbalize understanding of skin care regimen Date Initiated: 04/19/2021 Date Inactivated: 05/10/2021 Target Resolution Date: 04/19/2021 Goal Status: Met Ulcer/skin breakdown will have a volume reduction of 30% by week 4 Date Initiated: 04/19/2021 Target Resolution Date: 05/17/2021 Goal Status: Active Interventions: Assess patient/caregiver ability to obtain necessary supplies Assess patient/caregiver ability to perform ulcer/skin care regimen upon admission and as needed Assess ulceration(s) every visit Treatment Activities: Skin care regimen initiated : 04/19/2021 Topical wound management initiated : 04/19/2021 Notes: Electronic Signature(s) Signed: 05/17/2021 1:05:01 PM By: Donnamarie Poag Entered By: Donnamarie Poag on 05/17/2021 09:47:51 [name]  ([id number]) -------------------------------------------------------------------------------- Pain Assessment Details Patient Name: [name] Date of Service: 05/17/2021 9:30 AM Medical Record Number: [id number] Patient Account Number: [id number] Date of Birth/Sex: June 23, 1941 (80 y.o. M) Treating RN: Donnamarie Poag Primary Care Chela Sutphen: Dimas Chyle Other Clinician: Referring Peggye Poon: Dimas Chyle Treating Skyeler Smola/Extender: Yaakov Guthrie in Treatment: 4 Active Problems Location of Pain Severity and Description of Pain Patient Has Paino No Site Locations Rate the pain. Current Pain Level: 0 Pain Management and Medication Current Pain Management: Electronic Signature(s) Signed: 05/17/2021 1:05:01 PM By: Donnamarie Poag Entered By: Donnamarie Poag on 05/17/2021 09:36:01 [name] ([id number]) -------------------------------------------------------------------------------- Patient/Caregiver Education Details Patient Name: [name] Date of Service: 05/17/2021 9:30 AM Medical Record Number: [id number] Patient Account Number: [id number] Date of Birth/Gender: 1941/07/14 (80 y.o. M) Treating RN: Donnamarie Poag Primary Care Physician: Dimas Chyle Other Clinician: Referring Physician: Dimas Chyle Treating Physician/Extender: Yaakov Guthrie in Treatment: 4 Education Assessment Education Provided To: Patient Education Topics Provided Basic Hygiene:  Wound/Skin Impairment: Electronic Signature(s) Signed: 05/17/2021 1:05:01 PM By: Donnamarie Poag Entered By: Donnamarie Poag on 05/17/2021 09:48:11 [name] ([id number]) -------------------------------------------------------------------------------- Wound Assessment Details Patient Name: [name] Date of Service: 05/17/2021 9:30 AM Medical Record Number: [id number] Patient Account Number: [id number] Date of Birth/Sex: 08-16-1941 (80 y.o. M) Treating RN: Donnamarie Poag Primary Care Reznor Ferrando: Dimas Chyle Other  Clinician: Referring Akaila Rambo: Dimas Chyle Treating Brittni Hult/Extender: Yaakov Guthrie in Treatment: 4 Wound Status Wound Number: 1 Primary Diabetic Wound/Ulcer of the Lower Extremity Etiology: Wound Location: Left, Medial Lower Leg Wound Status: Open Wounding Event: Trauma Comorbid Arrhythmia, Hypertension, Type II Diabetes, Received Date Acquired: 04/02/2021 History: Radiation Weeks Of Treatment: 4 Clustered Wound: No Photos Wound Measurements Length: (cm) 5.3 % Red Width: (cm) 3.5 % Red Depth: (cm) 0.5 Epith Area: (cm) 14.569 Tunn Volume: (cm) 7.285 P Ma uction in Area: 11.7% uction in Volume: 79.9% elialization: Small (1-33%) eling: Yes osition (o'clock): 11 ximum Distance: (cm) 0.4 Undermining: Yes Location 1 Starting Position (o'clock): 12 Ending Position (o'clock): 4 Maximum Distance: (cm) 0.3 Location 2 Starting Position (o'clock): 10 Ending Position (o'clock): 10 Maximum Distance: (cm) 0.3 Wound Description Classification: Grade 2 Foul Wound Margin: Well defined, not attached Sloug Exudate Amount: Large Exudate Type: Serosanguineous Exudate Color: red, brown Odor After Cleansing: No h/Fibrino Yes Wound Bed Granulation Amount: Large (67-100%) Exposed Structure Granulation Quality: Red, Pink, Friable Fascia Exposed: No Necrotic Amount: Small (1-33%) Fat Layer (Subcutaneous Tissue) Exposed: Yes Necrotic Quality: Adherent Slough Tendon Exposed: No Muscle Exposed: No Joint Exposed: No KEIDRICK, MURTY ([id number]) Bone Exposed: No Electronic Signature(s) Signed: 05/17/2021 1:05:01 PM By: Donnamarie Poag Entered By: Donnamarie Poag on 05/17/2021 09:46:18 [name] ([id number]) -------------------------------------------------------------------------------- [place] Details Patient Name: [name] Date of Service: 05/17/2021 9:30 AM Medical Record Number: [id number] Patient Account Number: [id number] Date of Birth/Sex: October 30, 1941 (80 y.o.  M) Treating RN: Donnamarie Poag Primary Care Dorathy Stallone: Dimas Chyle Other Clinician: Referring Helen Winterhalter: Dimas Chyle Treating Oliana Gowens/Extender: Yaakov Guthrie in Treatment: 4 Vital Signs Time Taken: 09:36 Temperature (F): 98.1 Height (in): 68 Pulse (bpm): 75 Weight (lbs): 180 Respiratory Rate (breaths/min): 18 Body Mass Index (BMI): 27.4 Blood Pressure (mmHg): 148/71 Reference Range: 80 - 120 mg / dl Electronic Signature(s) Signed: 05/17/2021 1:05:01 PM By: Donnamarie Poag Entered ByDonnamarie Poag on 05/17/2021 09:35:47
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
## 2021-05-19 ENCOUNTER — Other Ambulatory Visit: Payer: Self-pay

## 2021-05-19 DIAGNOSIS — Z923 Personal history of irradiation: Secondary | ICD-10-CM | POA: Diagnosis not present

## 2021-05-19 DIAGNOSIS — Z7901 Long term (current) use of anticoagulants: Secondary | ICD-10-CM | POA: Diagnosis not present

## 2021-05-19 DIAGNOSIS — Z87891 Personal history of nicotine dependence: Secondary | ICD-10-CM | POA: Diagnosis not present

## 2021-05-19 DIAGNOSIS — I480 Paroxysmal atrial fibrillation: Secondary | ICD-10-CM | POA: Diagnosis not present

## 2021-05-19 DIAGNOSIS — S8012XD Contusion of left lower leg, subsequent encounter: Secondary | ICD-10-CM | POA: Diagnosis not present

## 2021-05-19 DIAGNOSIS — E1122 Type 2 diabetes mellitus with diabetic chronic kidney disease: Secondary | ICD-10-CM | POA: Diagnosis not present

## 2021-05-19 DIAGNOSIS — I129 Hypertensive chronic kidney disease with stage 1 through stage 4 chronic kidney disease, or unspecified chronic kidney disease: Secondary | ICD-10-CM | POA: Diagnosis not present

## 2021-05-19 DIAGNOSIS — Z8616 Personal history of COVID-19: Secondary | ICD-10-CM | POA: Diagnosis not present

## 2021-05-19 DIAGNOSIS — N183 Chronic kidney disease, stage 3 unspecified: Secondary | ICD-10-CM | POA: Diagnosis not present

## 2021-05-19 DIAGNOSIS — M810 Age-related osteoporosis without current pathological fracture: Secondary | ICD-10-CM | POA: Diagnosis not present

## 2021-05-19 DIAGNOSIS — E11622 Type 2 diabetes mellitus with other skin ulcer: Secondary | ICD-10-CM | POA: Diagnosis not present

## 2021-05-19 DIAGNOSIS — L97822 Non-pressure chronic ulcer of other part of left lower leg with fat layer exposed: Secondary | ICD-10-CM | POA: Diagnosis not present

## 2021-05-22 NOTE — Progress Notes (Signed)
[name], [name] ([id number]) Visit Report for 05/19/2021 Arrival Information Details Patient Name: [name], [name] Date of Service: 05/19/2021 9:45 AM Medical Record Number: [id number] Patient Account Number: [id number] Date of Birth/Sex: Sep 30, 1941 (80 y.o. M) Treating RN: Donnamarie Poag Primary Care Xana Bradt: Dimas Chyle Other Clinician: Referring Marselino Slayton: Dimas Chyle Treating Saurav Crumble/Extender: Skipper Cliche in Treatment: 4 Visit Information History Since Last Visit Added or deleted any medications: No Patient Arrived: Ambulatory Had a fall or experienced change in No Arrival Time: 09:32 activities of daily living that may affect Accompanied By: self risk of falls: Transfer Assistance: None Hospitalized since last visit: No Patient Identification Verified: Yes Has Dressing in Place as Prescribed: Yes Secondary Verification Process Completed: Yes Pain Present Now: No Patient Requires Transmission-Based No Precautions: Patient Has Alerts: Yes Patient Alerts: Patient on Blood Thinner DIABETIC Electronic Signature(s) Signed: 05/22/2021 2:49:51 PM By: Donnamarie Poag Entered By: Donnamarie Poag on 05/19/2021 09:40:58 [name] ([id number]) -------------------------------------------------------------------------------- Clinic Level of Care Assessment Details Patient Name: [name] Date of Service: 05/19/2021 9:45 AM Medical Record Number: [id number] Patient Account Number: [id number] Date of Birth/Sex: Nov 22, 1940 (80 y.o. M) Treating RN: Donnamarie Poag Primary Care Mickey Hebel: Dimas Chyle Other Clinician: Referring Carloyn Lahue: Dimas Chyle Treating Jacion Dismore/Extender: Skipper Cliche in Treatment: 4 Clinic Level of Care Assessment Items TOOL 4 Quantity Score []  - Use when only an EandM is performed on FOLLOW-UP visit 0 ASSESSMENTS - Nursing Assessment / Reassessment []  - Reassessment of Co-morbidities (includes updates in patient status) 0 []  - 0 Reassessment of Adherence  to Treatment Plan ASSESSMENTS - Wound and Skin Assessment / Reassessment X - Simple Wound Assessment / Reassessment - one wound 1 5 []  - 0 Complex Wound Assessment / Reassessment - multiple wounds []  - 0 Dermatologic / Skin Assessment (not related to wound area) ASSESSMENTS - Focused Assessment []  - Circumferential Edema Measurements - multi extremities 0 []  - 0 Nutritional Assessment / Counseling / Intervention []  - 0 Lower Extremity Assessment (monofilament, tuning fork, pulses) []  - 0 Peripheral Arterial Disease Assessment (using hand held doppler) ASSESSMENTS - Ostomy and/or Continence Assessment and Care []  - Incontinence Assessment and Management 0 []  - 0 Ostomy Care Assessment and Management (repouching, etc.) PROCESS - Coordination of Care X - Simple Patient / Family Education for ongoing care 1 15 []  - 0 Complex (extensive) Patient / Family Education for ongoing care []  - 0 Staff obtains Programmer, systems, Records, Test Results / Process Orders []  - 0 Staff telephones HHA, Nursing Homes / Clarify orders / etc []  - 0 Routine Transfer to another Facility (non-emergent condition) []  - 0 Routine Hospital Admission (non-emergent condition) []  - 0 New Admissions / Biomedical engineer / Ordering NPWT, Apligraf, etc. []  - 0 Emergency Hospital Admission (emergent condition) X- 1 10 Simple Discharge Coordination []  - 0 Complex (extensive) Discharge Coordination PROCESS - Special [name] []  - Pediatric / Minor Patient Management 0 []  - 0 Isolation Patient Management []  - 0 Hearing / Language / Visual special [name] []  - 0 Assessment of Community assistance (transportation, D/C planning, etc.) []  - 0 Additional assistance / Altered mentation []  - 0 Support Surface(s) Assessment (bed, cushion, seat, etc.) INTERVENTIONS - Wound Cleansing / Measurement CLEATIS, FANDRICH ([id number]) X- 1 5 Simple Wound Cleansing - one wound []  - 0 Complex Wound Cleansing - multiple wounds []  -  0 Wound Imaging (photographs - any number of wounds) []  - 0 Wound Tracing (instead of photographs) []  - 0 Simple Wound Measurement - one wound []  - 0 Complex Wound  Measurement - multiple wounds INTERVENTIONS - Wound Dressings X - Small Wound Dressing one or multiple wounds 1 10 []  - 0 Medium Wound Dressing one or multiple wounds []  - 0 Large Wound Dressing one or multiple wounds []  - 0 Application of Medications - topical []  - 0 Application of Medications - injection INTERVENTIONS - Miscellaneous []  - External ear exam 0 []  - 0 Specimen Collection (cultures, biopsies, blood, body fluids, etc.) []  - 0 Specimen(s) / Culture(s) sent or taken to Lab for analysis []  - 0 Patient Transfer (multiple staff / Civil Service fast streamer / Similar devices) []  - 0 Simple Staple / Suture removal (25 or less) []  - 0 Complex Staple / Suture removal (26 or more) []  - 0 Hypo / Hyperglycemic Management (close monitor of Blood Glucose) []  - 0 Ankle / Brachial Index (ABI) - do not check if billed separately []  - 0 Vital Signs Has the patient been seen at the hospital within the last three years: Yes Total Score: 45 Level Of Care: New/Established - Level 2 Electronic Signature(s) Signed: 05/22/2021 2:49:51 PM By: Donnamarie Poag Entered By: Donnamarie Poag on 05/19/2021 09:42:09 [name] ([id number]) -------------------------------------------------------------------------------- Encounter Discharge Information Details Patient Name: [name] Date of Service: 05/19/2021 9:45 AM Medical Record Number: [id number] Patient Account Number: [id number] Date of Birth/Sex: February 14, 1941 (80 y.o. M) Treating RN: Donnamarie Poag Primary Care Azara Gemme: Dimas Chyle Other Clinician: Referring Raybon Conard: Dimas Chyle Treating Julious Langlois/Extender: Skipper Cliche in Treatment: 4 Encounter Discharge Information Items Discharge Condition: Stable Ambulatory Status: Ambulatory Discharge Destination: Home Transportation:  Private Auto Accompanied By: self Schedule Follow-up Appointment: Yes Clinical Summary of Care: Electronic Signature(s) Signed: 05/22/2021 2:49:51 PM By: Donnamarie Poag Entered By: Donnamarie Poag on 05/19/2021 09:41:49 [name] ([id number]) -------------------------------------------------------------------------------- Wound Assessment Details Patient Name: [name] Date of Service: 05/19/2021 9:45 AM Medical Record Number: [id number] Patient Account Number: [id number] Date of Birth/Sex: [birth year] (80 y.o. M) Treating RN: Donnamarie Poag Primary Care Mariea Mcmartin: Dimas Chyle Other Clinician: Referring Jarrod Mcenery: Dimas Chyle Treating Anne Sebring/Extender: Jeri Cos Weeks in Treatment: 4 Wound Status Wound Number: 1 Primary Diabetic Wound/Ulcer of the Lower Extremity Etiology: Wound Location: Left, Medial Lower Leg Wound Status: Open Wounding Event: Trauma Comorbid Arrhythmia, Hypertension, Type II Diabetes, Received Date Acquired: 04/02/2021 History: Radiation Weeks Of Treatment: 4 Clustered Wound: No Wound Measurements Length: (cm) 5.3 Width: (cm) 3.5 Depth: (cm) 0.5 Area: (cm) 14.569 Volume: (cm) 7.285 % Reduction in Area: 11.7% % Reduction in Volume: 79.9% Epithelialization: Small (1-33%) Wound Description Classification: Grade 2 Wound Margin: Well defined, not attached Exudate Amount: Large Exudate Type: Serosanguineous Exudate Color: red, brown Foul Odor After Cleansing: No Slough/Fibrino Yes Wound Bed Granulation Amount: Large (67-100%) Exposed Structure Granulation Quality: Red, Pink, Friable Fascia Exposed: No Necrotic Amount: Small (1-33%) Fat Layer (Subcutaneous Tissue) Exposed: Yes Necrotic Quality: Adherent Slough Tendon Exposed: No Muscle Exposed: No Joint Exposed: No Bone Exposed: No Treatment Notes Wound #1 (Lower Leg) Wound Laterality: Left, Medial Cleanser Soap and Water Discharge Instruction: Gently cleanse wound with antibacterial soap,  rinse and pat dry prior to dressing wounds Peri-Wound Care Desitin Maximum Strength Ointment 4 (oz) Discharge Instruction: GENEROUSLY Apply periwound Topical Santyl Collagenase Ointment, 30 (gm), tube Discharge Instruction: Apply nickel thick to wound bed Primary Dressing Hydrofera Blue Ready Transfer Foam, 4x5 (in/in) Discharge Instruction: use the rope for the tunnel Apply Hydrofera Blue Ready to wound bed as directed Secondary Dressing ABD Pad 5x9 (in/in) Discharge Instruction: Cover with ABD pad KOBYN, KRAY ([id number]) Kerlix 4.5 x 4.1 (in/yd) Discharge  Instruction: OVER ABD PAD, Apply Kerlix 4.5 x 4.1 (in/yd) as instructed Secured With Coban Cohesive Bandage 4x5 (yds) Stretched Discharge Instruction: Apply Coban as directed AS TOP LAYER Compression Wrap Compression Stockings Add-Ons Electronic Signature(s) Signed: 05/22/2021 2:49:51 PM By: Donnamarie Poag Entered ByDonnamarie Poag on 05/19/2021 09:41:14

## 2021-05-24 ENCOUNTER — Encounter (HOSPITAL_BASED_OUTPATIENT_CLINIC_OR_DEPARTMENT_OTHER): Payer: Medicare Other | Admitting: Internal Medicine

## 2021-05-24 ENCOUNTER — Other Ambulatory Visit: Payer: Self-pay

## 2021-05-24 DIAGNOSIS — N183 Chronic kidney disease, stage 3 unspecified: Secondary | ICD-10-CM | POA: Diagnosis not present

## 2021-05-24 DIAGNOSIS — I129 Hypertensive chronic kidney disease with stage 1 through stage 4 chronic kidney disease, or unspecified chronic kidney disease: Secondary | ICD-10-CM | POA: Diagnosis not present

## 2021-05-24 DIAGNOSIS — L97822 Non-pressure chronic ulcer of other part of left lower leg with fat layer exposed: Secondary | ICD-10-CM | POA: Diagnosis not present

## 2021-05-24 DIAGNOSIS — Z7901 Long term (current) use of anticoagulants: Secondary | ICD-10-CM | POA: Diagnosis not present

## 2021-05-24 DIAGNOSIS — Z923 Personal history of irradiation: Secondary | ICD-10-CM | POA: Diagnosis not present

## 2021-05-24 DIAGNOSIS — Z87891 Personal history of nicotine dependence: Secondary | ICD-10-CM | POA: Diagnosis not present

## 2021-05-24 DIAGNOSIS — I480 Paroxysmal atrial fibrillation: Secondary | ICD-10-CM | POA: Diagnosis not present

## 2021-05-24 DIAGNOSIS — Z8616 Personal history of COVID-19: Secondary | ICD-10-CM | POA: Diagnosis not present

## 2021-05-24 DIAGNOSIS — E11622 Type 2 diabetes mellitus with other skin ulcer: Secondary | ICD-10-CM | POA: Diagnosis not present

## 2021-05-24 DIAGNOSIS — E1122 Type 2 diabetes mellitus with diabetic chronic kidney disease: Secondary | ICD-10-CM | POA: Diagnosis not present

## 2021-05-24 DIAGNOSIS — S8012XD Contusion of left lower leg, subsequent encounter: Secondary | ICD-10-CM | POA: Diagnosis not present

## 2021-05-24 DIAGNOSIS — M810 Age-related osteoporosis without current pathological fracture: Secondary | ICD-10-CM | POA: Diagnosis not present

## 2021-05-24 NOTE — Progress Notes (Signed)
[name] ([id number]) Visit Report for 05/24/2021 Arrival Information Details Patient Name: [name] Date of Service: 05/24/2021 9:30 AM Medical Record Number: [id number] Patient Account Number: [id number] Date of Birth/Sex: [birth year] (80 y.o. M) Treating RN: Donnamarie Poag Primary Care Enid Maultsby: Dimas Chyle Other Clinician: Referring Deyonte Cadden: Dimas Chyle Treating Everado Pillsbury/Extender: Yaakov Guthrie in Treatment: 5 Visit Information History Since Last Visit Added or deleted any medications: No Patient Arrived: Ambulatory Hospitalized since last visit: No Arrival Time: 09:26 Has Dressing in Place as Prescribed: Yes Accompanied By: friend Pain Present Now: No Transfer Assistance: None Patient Identification Verified: Yes Secondary Verification Process Completed: Yes Patient Requires Transmission-Based No Precautions: Patient Has Alerts: Yes Patient Alerts: Patient on Blood Thinner DIABETIC Electronic Signature(s) Signed: 05/24/2021 11:43:39 AM By: Donnamarie Poag Entered By: Donnamarie Poag on 05/24/2021 09:28:13 [name] ([id number]) -------------------------------------------------------------------------------- Clinic Level of Care Assessment Details Patient Name: [name] Date of Service: 05/24/2021 9:30 AM Medical Record Number: [id number] Patient Account Number: [id number] Date of Birth/Sex: May 27, 1941 (80 y.o. M) Treating RN: Donnamarie Poag Primary Care Antanasia Kaczynski: Dimas Chyle Other Clinician: Referring Lorn Butcher: Dimas Chyle Treating Laveyah Oriol/Extender: Yaakov Guthrie in Treatment: 5 Clinic Level of Care Assessment Items TOOL 1 Quantity Score []  - Use when EandM and Procedure is performed on INITIAL visit 0 ASSESSMENTS - Nursing Assessment / Reassessment []  - General Physical Exam (combine w/ comprehensive assessment (listed just below) when performed on new 0 pt. evals) []  - 0 Comprehensive Assessment (HX, ROS, Risk Assessments, Wounds  Hx, etc.) ASSESSMENTS - Wound and Skin Assessment / Reassessment []  - Dermatologic / Skin Assessment (not related to wound area) 0 ASSESSMENTS - Ostomy and/or Continence Assessment and Care []  - Incontinence Assessment and Management 0 []  - 0 Ostomy Care Assessment and Management (repouching, etc.) PROCESS - Coordination of Care []  - Simple Patient / Family Education for ongoing care 0 []  - 0 Complex (extensive) Patient / Family Education for ongoing care []  - 0 Staff obtains Programmer, systems, Records, Test Results / Process Orders []  - 0 Staff telephones HHA, Nursing Homes / Clarify orders / etc []  - 0 Routine Transfer to another Facility (non-emergent condition) []  - 0 Routine Hospital Admission (non-emergent condition) []  - 0 New Admissions / Biomedical engineer / Ordering NPWT, Apligraf, etc. []  - 0 Emergency Hospital Admission (emergent condition) PROCESS - Special [name] []  - Pediatric / Minor Patient Management 0 []  - 0 Isolation Patient Management []  - 0 Hearing / Language / Visual special [name] []  - 0 Assessment of Community assistance (transportation, D/C planning, etc.) []  - 0 Additional assistance / Altered mentation []  - 0 Support Surface(s) Assessment (bed, cushion, seat, etc.) INTERVENTIONS - Miscellaneous []  - External ear exam 0 []  - 0 Patient Transfer (multiple staff / Civil Service fast streamer / Similar devices) []  - 0 Simple Staple / Suture removal (25 or less) []  - 0 Complex Staple / Suture removal (26 or more) []  - 0 Hypo/Hyperglycemic Management (do not check if billed separately) []  - 0 Ankle / Brachial Index (ABI) - do not check if billed separately Has the patient been seen at the hospital within the last three years: Yes Total Score: 0 Level Of Care: ____ [name] ([id number]) Electronic Signature(s) Signed: 05/24/2021 11:43:39 AM By: Donnamarie Poag Entered By: Donnamarie Poag on 05/24/2021 10:09:45 [name]  ([id number]) -------------------------------------------------------------------------------- Encounter Discharge Information Details Patient Name: [name] Date of Service: 05/24/2021 9:30 AM Medical Record Number: [id number] Patient Account Number: [id number] Date of Birth/Sex: [birth year] (80 y.o. M) Treating RN: Donnamarie Poag  Primary Care Sarita Hakanson: Dimas Chyle Other Clinician: Referring Koren Plyler: Dimas Chyle Treating Claris Pech/Extender: Yaakov Guthrie in Treatment: 5 Encounter Discharge Information Items Post Procedure Vitals Discharge Condition: Stable Temperature (F): 98.1 Ambulatory Status: Ambulatory Pulse (bpm): 83 Discharge Destination: Home Respiratory Rate (breaths/min): 16 Transportation: Private Auto Blood Pressure (mmHg): 130/74 Accompanied By: girlfriend Schedule Follow-up Appointment: Yes Clinical Summary of Care: Electronic Signature(s) Signed: 05/24/2021 11:43:39 AM By: Donnamarie Poag Entered By: Donnamarie Poag on 05/24/2021 10:20:27 [name] ([id number]) -------------------------------------------------------------------------------- Lower Extremity Assessment Details Patient Name: [name] Date of Service: 05/24/2021 9:30 AM Medical Record Number: [id number] Patient Account Number: [id number] Date of Birth/Sex: 1941-02-12 (80 y.o. M) Treating RN: Donnamarie Poag Primary Care Duaa Stelzner: Dimas Chyle Other Clinician: Referring Edeline Greening: Dimas Chyle Treating Sharad Vaneaton/Extender: Yaakov Guthrie in Treatment: 5 Edema Assessment Assessed: [Left: Yes] [Right: No] [Left: Edema] [Right: :] Calf Left: Right: Point of Measurement: 36 cm From Medial Instep 33.5 cm Ankle Left: Right: Point of Measurement: 9 cm From Medial Instep 23 cm Vascular Assessment Pulses: Dorsalis Pedis Palpable: [Left:Yes] Electronic Signature(s) Signed: 05/24/2021 11:43:39 AM By: Donnamarie Poag Entered By: Donnamarie Poag on 05/24/2021 09:38:53 [name]  ([id number]) -------------------------------------------------------------------------------- Multi Wound Chart Details Patient Name: [name] Date of Service: 05/24/2021 9:30 AM Medical Record Number: [id number] Patient Account Number: [id number] Date of Birth/Sex: 12/04/1940 (80 y.o. M) Treating RN: Donnamarie Poag Primary Care Daily Crate: Dimas Chyle Other Clinician: Referring Fronnie Urton: Dimas Chyle Treating Jaki Hammerschmidt/Extender: Yaakov Guthrie in Treatment: 5 Vital Signs Height(in): 68 Pulse(bpm): 83 Weight(lbs): 180 Blood Pressure(mmHg): 130/74 Body Mass Index(BMI): 27 Temperature(F): 98.1 Respiratory Rate(breaths/min): 16 Photos: [N/A:N/A] Wound Location: Left, Medial Lower Leg N/A N/A Wounding Event: Trauma N/A N/A Primary Etiology: Diabetic Wound/Ulcer of the Lower N/A N/A Extremity Comorbid History: Arrhythmia, Hypertension, Type II N/A N/A Diabetes, Received Radiation Date Acquired: 04/02/2021 N/A N/A Weeks of Treatment: 5 N/A N/A Wound Status: Open N/A N/A Measurements L x W x D (cm) 4.5x3x0.4 N/A N/A Area (cm) : 10.603 N/A N/A Volume (cm) : 4.241 N/A N/A % Reduction in Area: 35.70% N/A N/A % Reduction in Volume: 88.30% N/A N/A Position 1 (o'clock): 11 Maximum Distance 1 (cm): 0.6 Tunneling: Yes N/A N/A Classification: Grade 2 N/A N/A Exudate Amount: Medium N/A N/A Exudate Type: Serosanguineous N/A N/A Exudate Color: red, brown N/A N/A Wound Margin: Well defined, not attached N/A N/A Granulation Amount: Large (67-100%) N/A N/A Granulation Quality: Red, Pink N/A N/A Necrotic Amount: Small (1-33%) N/A N/A Exposed Structures: Fat Layer (Subcutaneous Tissue): N/A N/A Yes Fascia: No Tendon: No Muscle: No Joint: No Bone: No Epithelialization: Small (1-33%) N/A N/A Debridement: Debridement - Excisional N/A N/A Pre-procedure Verification/Time 10:05 N/A N/A Out Taken: Tissue Debrided: Subcutaneous, Slough N/A N/A Level: Skin/Subcutaneous Tissue N/A  N/A Debridement Area (sq cm): 13.5 N/A N/A Instrument: Curette N/A N/A Bleeding: Moderate N/A N/A Hemostasis Achieved: Pressure N/A N/A ADRICK, KESTLER ([id number]) Debridement Treatment Procedure was tolerated well N/A N/A Response: Post Debridement 4.5x3x0.4 N/A N/A Measurements L x W x D (cm) Post Debridement Volume: 4.241 N/A N/A (cm) Procedures Performed: Debridement N/A N/A Treatment Notes Wound #1 (Lower Leg) Wound Laterality: Left, Medial Cleanser Soap and Water Discharge Instruction: Gently cleanse wound with antibacterial soap, rinse and pat dry prior to dressing wounds Peri-Wound Care Desitin Maximum Strength Ointment 4 (oz) Discharge Instruction: GENEROUSLY Apply periwound Topical Santyl Collagenase Ointment, 30 (gm), tube Discharge Instruction: Apply nickel thick to wound bed Primary Dressing Hydrofera Blue Ready Transfer Foam, 4x5 (in/in) Discharge Instruction: use the rope for the tunnel  Apply Hydrofera Blue Ready to wound bed as directed Secondary Dressing ABD Pad 5x9 (in/in) Discharge Instruction: Cover with ABD pad Kerlix 4.5 x 4.1 (in/yd) Discharge Instruction: OVER ABD PAD, Apply Kerlix 4.5 x 4.1 (in/yd) as instructed Secured With Coban Cohesive Bandage 4x5 (yds) Stretched Discharge Instruction: Apply Coban as directed AS TOP LAYER Compression Wrap Compression Stockings Add-Ons Electronic Signature(s) Signed: 05/24/2021 10:45:06 AM By: Kalman Shan DO Entered By: Kalman Shan on 05/24/2021 10:40:04 [name] ([id number]) -------------------------------------------------------------------------------- Multi-Disciplinary Care Plan Details Patient Name: [name] Date of Service: 05/24/2021 9:30 AM Medical Record Number: [id number] Patient Account Number: [id number] Date of Birth/Sex: 1941-05-15 (80 y.o. M) Treating RN: Donnamarie Poag Primary Care Miracle Mongillo: Dimas Chyle Other Clinician: Referring Rudra Hobbins: Dimas Chyle Treating  Jaedin Trumbo/Extender: Yaakov Guthrie in Treatment: 5 Active Inactive Wound/Skin Impairment Nursing Diagnoses: Impaired tissue integrity Knowledge deficit related to smoking impact on wound healing Goals: Patient/caregiver will verbalize understanding of skin care regimen Date Initiated: 04/19/2021 Date Inactivated: 05/10/2021 Target Resolution Date: 04/19/2021 Goal Status: Met Ulcer/skin breakdown will have a volume reduction of 30% by week 4 Date Initiated: 04/19/2021 Target Resolution Date: 05/17/2021 Goal Status: Active Interventions: Assess patient/caregiver ability to obtain necessary supplies Assess patient/caregiver ability to perform ulcer/skin care regimen upon admission and as needed Assess ulceration(s) every visit Treatment Activities: Skin care regimen initiated : 04/19/2021 Topical wound management initiated : 04/19/2021 Notes: Electronic Signature(s) Signed: 05/24/2021 11:43:39 AM By: Donnamarie Poag Entered By: Donnamarie Poag on 05/24/2021 09:39:16 [name] ([id number]) -------------------------------------------------------------------------------- Pain Assessment Details Patient Name: [name] Date of Service: 05/24/2021 9:30 AM Medical Record Number: [id number] Patient Account Number: [id number] Date of Birth/Sex: [birth year] (80 y.o. M) Treating RN: Donnamarie Poag Primary Care Terriyah Westra: Dimas Chyle Other Clinician: Referring Dozier Berkovich: Dimas Chyle Treating Vasiliy Mccarry/Extender: Yaakov Guthrie in Treatment: 5 Active Problems Location of Pain Severity and Description of Pain Patient Has Paino No Site Locations Rate the pain. Current Pain Level: 0 Pain Management and Medication Current Pain Management: Electronic Signature(s) Signed: 05/24/2021 11:43:39 AM By: Donnamarie Poag Entered By: Donnamarie Poag on 05/24/2021 09:28:39 [name] ([id number]) -------------------------------------------------------------------------------- Patient/Caregiver  Education Details Patient Name: [name] Date of Service: 05/24/2021 9:30 AM Medical Record Number: [id number] Patient Account Number: [id number] Date of Birth/Gender: [birth year] (80 y.o. M) Treating RN: Donnamarie Poag Primary Care Physician: Dimas Chyle Other Clinician: Referring Physician: Dimas Chyle Treating Physician/Extender: Yaakov Guthrie in Treatment: 5 Education Assessment Education Provided To: Patient Education Topics Provided Wound/Skin Impairment: Electronic Signature(s) Signed: 05/24/2021 11:43:39 AM By: Donnamarie Poag Entered By: Donnamarie Poag on 05/24/2021 10:13:28 [name] ([id number]) -------------------------------------------------------------------------------- Wound Assessment Details Patient Name: [name] Date of Service: 05/24/2021 9:30 AM Medical Record Number: [id number] Patient Account Number: [id number] Date of Birth/Sex: 09-11-1941 (80 y.o. M) Treating RN: Donnamarie Poag Primary Care Mazin Emma: Dimas Chyle Other Clinician: Referring Dain Laseter: Dimas Chyle Treating Darcee Dekker/Extender: Yaakov Guthrie in Treatment: 5 Wound Status Wound Number: 1 Primary Diabetic Wound/Ulcer of the Lower Extremity Etiology: Wound Location: Left, Medial Lower Leg Wound Status: Open Wounding Event: Trauma Comorbid Arrhythmia, Hypertension, Type II Diabetes, Received Date Acquired: 04/02/2021 History: Radiation Weeks Of Treatment: 5 Clustered Wound: No Photos Wound Measurements Length: (cm) 4.5 % Red Width: (cm) 3 % Red Depth: (cm) 0.4 Epith Area: (cm) 10.603 Tunn Volume: (cm) 4.241 P Ma uction in Area: 35.7% uction in Volume: 88.3% elialization: Small (1-33%) eling: Yes osition (o'clock): 11 ximum Distance: (cm) 0.6 Undermining: No Wound Description Classification: Grade 2 Foul Wound Margin: Well defined, not attached Sloug Exudate Amount: Medium  Exudate Type: Serosanguineous Exudate Color: red, brown Odor After Cleansing:  No h/Fibrino Yes Wound Bed Granulation Amount: Large (67-100%) Exposed Structure Granulation Quality: Red, Pink Fascia Exposed: No Necrotic Amount: Small (1-33%) Fat Layer (Subcutaneous Tissue) Exposed: Yes Necrotic Quality: Adherent Slough Tendon Exposed: No Muscle Exposed: No Joint Exposed: No Bone Exposed: No Treatment Notes Wound #1 (Lower Leg) Wound Laterality: Left, Medial Cleanser Soap and Water JUANLUIS, GUASTELLA ([id number]) Discharge Instruction: Gently cleanse wound with antibacterial soap, rinse and pat dry prior to dressing wounds Peri-Wound Care Desitin Maximum Strength Ointment 4 (oz) Discharge Instruction: GENEROUSLY Apply periwound Topical Santyl Collagenase Ointment, 30 (gm), tube Discharge Instruction: Apply nickel thick to wound bed Primary Dressing Hydrofera Blue Ready Transfer Foam, 4x5 (in/in) Discharge Instruction: use the rope for the tunnel Apply Hydrofera Blue Ready to wound bed as directed Secondary Dressing ABD Pad 5x9 (in/in) Discharge Instruction: Cover with ABD pad Kerlix 4.5 x 4.1 (in/yd) Discharge Instruction: OVER ABD PAD, Apply Kerlix 4.5 x 4.1 (in/yd) as instructed Secured With Coban Cohesive Bandage 4x5 (yds) Stretched Discharge Instruction: Apply Coban as directed AS TOP LAYER Compression Wrap Compression Stockings Add-Ons Electronic Signature(s) Signed: 05/24/2021 11:43:39 AM By: Donnamarie Poag Entered By: Donnamarie Poag on 05/24/2021 09:37:34 [name] ([id number]) -------------------------------------------------------------------------------- [place] Details Patient Name: [name] Date of Service: 05/24/2021 9:30 AM Medical Record Number: [id number] Patient Account Number: [id number] Date of Birth/Sex: Oct 30, 1941 (80 y.o. M) Treating RN: Donnamarie Poag Primary Care Breckon Reeves: Dimas Chyle Other Clinician: Referring Annelle Behrendt: Dimas Chyle Treating Arieana Somoza/Extender: Yaakov Guthrie in Treatment: 5 Vital Signs Time Taken:  09:29 Temperature (F): 98.1 Height (in): 68 Pulse (bpm): 83 Weight (lbs): 180 Respiratory Rate (breaths/min): 16 Body Mass Index (BMI): 27.4 Blood Pressure (mmHg): 130/74 Reference Range: 80 - 120 mg / dl Electronic Signature(s) Signed: 05/24/2021 11:43:39 AM By: Donnamarie Poag Entered ByDonnamarie Poag on 05/24/2021 09:28:30

## 2021-05-25 ENCOUNTER — Other Ambulatory Visit: Payer: Self-pay | Admitting: Family Medicine
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
## 2021-05-31 ENCOUNTER — Encounter (HOSPITAL_BASED_OUTPATIENT_CLINIC_OR_DEPARTMENT_OTHER): Payer: Medicare Other | Admitting: Internal Medicine

## 2021-05-31 ENCOUNTER — Other Ambulatory Visit: Payer: Self-pay

## 2021-05-31 DIAGNOSIS — L97822 Non-pressure chronic ulcer of other part of left lower leg with fat layer exposed: Secondary | ICD-10-CM | POA: Diagnosis not present

## 2021-05-31 DIAGNOSIS — S81801A Unspecified open wound, right lower leg, initial encounter: Secondary | ICD-10-CM | POA: Diagnosis not present

## 2021-05-31 DIAGNOSIS — E11622 Type 2 diabetes mellitus with other skin ulcer: Secondary | ICD-10-CM

## 2021-05-31 DIAGNOSIS — S8012XD Contusion of left lower leg, subsequent encounter: Secondary | ICD-10-CM

## 2021-05-31 DIAGNOSIS — Z7901 Long term (current) use of anticoagulants: Secondary | ICD-10-CM | POA: Diagnosis not present

## 2021-05-31 DIAGNOSIS — I872 Venous insufficiency (chronic) (peripheral): Secondary | ICD-10-CM | POA: Diagnosis not present

## 2021-05-31 DIAGNOSIS — Z923 Personal history of irradiation: Secondary | ICD-10-CM | POA: Diagnosis not present

## 2021-05-31 DIAGNOSIS — Z8616 Personal history of COVID-19: Secondary | ICD-10-CM | POA: Diagnosis not present

## 2021-05-31 DIAGNOSIS — E1122 Type 2 diabetes mellitus with diabetic chronic kidney disease: Secondary | ICD-10-CM | POA: Diagnosis not present

## 2021-05-31 DIAGNOSIS — M810 Age-related osteoporosis without current pathological fracture: Secondary | ICD-10-CM | POA: Diagnosis not present

## 2021-05-31 DIAGNOSIS — I129 Hypertensive chronic kidney disease with stage 1 through stage 4 chronic kidney disease, or unspecified chronic kidney disease: Secondary | ICD-10-CM | POA: Diagnosis not present

## 2021-05-31 DIAGNOSIS — I480 Paroxysmal atrial fibrillation: Secondary | ICD-10-CM | POA: Diagnosis not present

## 2021-05-31 DIAGNOSIS — N183 Chronic kidney disease, stage 3 unspecified: Secondary | ICD-10-CM | POA: Diagnosis not present

## 2021-05-31 DIAGNOSIS — Z87891 Personal history of nicotine dependence: Secondary | ICD-10-CM | POA: Diagnosis not present

## 2021-06-01 ENCOUNTER — Telehealth: Payer: Self-pay | Admitting: Family Medicine

## 2021-06-01 NOTE — Telephone Encounter (Signed)
Spoke with patient he stated he was changing providers but did not know what doctor yet.

## 2021-06-02 ENCOUNTER — Other Ambulatory Visit: Payer: Self-pay

## 2021-06-02 ENCOUNTER — Encounter: Payer: Self-pay | Admitting: Podiatry

## 2021-06-02 ENCOUNTER — Ambulatory Visit: Payer: Medicare Other | Admitting: Podiatry

## 2021-06-02 DIAGNOSIS — M79675 Pain in left toe(s): Secondary | ICD-10-CM

## 2021-06-02 DIAGNOSIS — M79674 Pain in right toe(s): Secondary | ICD-10-CM

## 2021-06-02 DIAGNOSIS — E119 Type 2 diabetes mellitus without complications: Secondary | ICD-10-CM

## 2021-06-02 DIAGNOSIS — B351 Tinea unguium: Secondary | ICD-10-CM | POA: Diagnosis not present

## 2021-06-02 NOTE — Progress Notes (Signed)
This patient returns to my office for at risk foot care.  This patient requires this care by a professional since this patient will be at risk due to having diabetes.  This patient is unable to cut nails himself since the patient cannot reach his nails.These nails are painful walking and wearing shoes.  This patient presents for at risk foot care today.  General Appearance  Alert, conversant and in no acute stress.  Vascular  Dorsalis pedis and posterior tibial  pulses are palpable  bilaterally.  Capillary return is within normal limits  bilaterally. Temperature is within normal limits  bilaterally.  Neurologic  Senn-Weinstein monofilament wire test within normal limits  bilaterally. Muscle power within normal limits bilaterally.  Nails Thick disfigured discolored nails with subungual debris  from hallux to fifth toes bilaterally. No evidence of bacterial infection or drainage bilaterally.  Orthopedic  No limitations of motion  feet .  No crepitus or effusions noted.  No bony pathology or digital deformities noted.  Skin  normotropic skin with no porokeratosis noted bilaterally.  No signs of infections or ulcers noted.     Onychomycosis  Pain in right toes  Pain in left toes  Consent was obtained for treatment procedures.   Mechanical debridement of nails 1-5  bilaterally performed with a nail nipper.  Filed with dremel without incident.    Return office visit    9 months                  Told patient to return for periodic foot care and evaluation due to potential at risk complications.   Gardiner Barefoot DPM

## 2021-06-03 NOTE — Progress Notes (Signed)
JAHID, WEIDA ([id number]) Visit Report for 05/31/2021 Arrival Information Details Patient Name: [name], [name] Date of Service: 05/31/2021 9:30 AM Medical Record Number: [id number] Patient Account Number: [id number] Date of Birth/Sex: May 17, 1941 (80 y.o. M) Treating RN: Carlene Coria Primary Care Ymani Porcher: Dimas Chyle Other Clinician: Referring Mj Willis: Dimas Chyle Treating Josselin Gaulin/Extender: Yaakov Guthrie in Treatment: 6 Visit Information History Since Last Visit Added or deleted any medications: No Patient Arrived: Ambulatory Had a fall or experienced change in No Arrival Time: 09:38 activities of daily living that may affect Accompanied By: girlfriend risk of falls: Transfer Assistance: None Hospitalized since last visit: No Patient Identification Verified: Yes Has Dressing in Place as Prescribed: Yes Secondary Verification Process Completed: Yes Has Compression in Place as Prescribed: Yes Patient Requires Transmission-Based No Pain Present Now: No Precautions: Patient Has Alerts: Yes Patient Alerts: Patient on Blood Thinner DIABETIC Electronic Signature(s) Signed: 06/02/2021 4:32:50 PM By: Carlene Coria RN Entered By: Carlene Coria on 05/31/2021 09:39:19 [name] ([id number]) -------------------------------------------------------------------------------- Clinic Level of Care Assessment Details Patient Name: [name] Date of Service: 05/31/2021 9:30 AM Medical Record Number: [id number] Patient Account Number: [id number] Date of Birth/Sex: 1941-06-08 (80 y.o. M) Treating RN: Donnamarie Poag Primary Care Zylen Wenig: Dimas Chyle Other Clinician: Referring Miangel Flom: Dimas Chyle Treating Keanon Bevins/Extender: Yaakov Guthrie in Treatment: 6 Clinic Level of Care Assessment Items TOOL 4 Quantity Score []  - Use when only an EandM is performed on FOLLOW-UP visit 0 ASSESSMENTS - Nursing Assessment / Reassessment []  - Reassessment of Co-morbidities  (includes updates in patient status) 0 []  - 0 Reassessment of Adherence to Treatment Plan ASSESSMENTS - Wound and Skin Assessment / Reassessment []  - Simple Wound Assessment / Reassessment - one wound 0 X- 2 5 Complex Wound Assessment / Reassessment - multiple wounds []  - 0 Dermatologic / Skin Assessment (not related to wound area) ASSESSMENTS - Focused Assessment []  - Circumferential Edema Measurements - multi extremities 0 []  - 0 Nutritional Assessment / Counseling / Intervention []  - 0 Lower Extremity Assessment (monofilament, tuning fork, pulses) []  - 0 Peripheral Arterial Disease Assessment (using hand held doppler) ASSESSMENTS - Ostomy and/or Continence Assessment and Care []  - Incontinence Assessment and Management 0 []  - 0 Ostomy Care Assessment and Management (repouching, etc.) PROCESS - Coordination of Care X - Simple Patient / Family Education for ongoing care 1 15 []  - 0 Complex (extensive) Patient / Family Education for ongoing care []  - 0 Staff obtains Programmer, systems, Records, Test Results / Process Orders []  - 0 Staff telephones HHA, Nursing Homes / Clarify orders / etc []  - 0 Routine Transfer to another Facility (non-emergent condition) []  - 0 Routine Hospital Admission (non-emergent condition) []  - 0 New Admissions / Biomedical engineer / Ordering NPWT, Apligraf, etc. []  - 0 Emergency Hospital Admission (emergent condition) X- 1 10 Simple Discharge Coordination []  - 0 Complex (extensive) Discharge Coordination PROCESS - Special [name] []  - Pediatric / Minor Patient Management 0 []  - 0 Isolation Patient Management []  - 0 Hearing / Language / Visual special [name] []  - 0 Assessment of Community assistance (transportation, D/C planning, etc.) []  - 0 Additional assistance / Altered mentation []  - 0 Support Surface(s) Assessment (bed, cushion, seat, etc.) INTERVENTIONS - Wound Cleansing / Measurement CLAIBORNE, STROBLE ([id number]) []  - 0 Simple Wound  Cleansing - one wound X- 2 5 Complex Wound Cleansing - multiple wounds []  - 0 Wound Imaging (photographs - any number of wounds) []  - 0 Wound Tracing (instead of photographs) []  - 0 Simple Wound Measurement -  one wound X- 2 5 Complex Wound Measurement - multiple wounds INTERVENTIONS - Wound Dressings []  - Small Wound Dressing one or multiple wounds 0 X- 2 15 Medium Wound Dressing one or multiple wounds []  - 0 Large Wound Dressing one or multiple wounds []  - 0 Application of Medications - topical []  - 0 Application of Medications - injection INTERVENTIONS - Miscellaneous []  - External ear exam 0 []  - 0 Specimen Collection (cultures, biopsies, blood, body fluids, etc.) []  - 0 Specimen(s) / Culture(s) sent or taken to Lab for analysis []  - 0 Patient Transfer (multiple staff / Civil Service fast streamer / Similar devices) []  - 0 Simple Staple / Suture removal (25 or less) []  - 0 Complex Staple / Suture removal (26 or more) []  - 0 Hypo / Hyperglycemic Management (close monitor of Blood Glucose) []  - 0 Ankle / Brachial Index (ABI) - do not check if billed separately X- 1 5 Vital Signs Has the patient been seen at the hospital within the last three years: Yes Total Score: 90 Level Of Care: New/Established - Level 3 Electronic Signature(s) Signed: 05/31/2021 1:09:54 PM By: Donnamarie Poag Entered By: Donnamarie Poag on 05/31/2021 10:49:53 [name] ([id number]) -------------------------------------------------------------------------------- Encounter Discharge Information Details Patient Name: [name] Date of Service: 05/31/2021 9:30 AM Medical Record Number: [id number] Patient Account Number: [id number] Date of Birth/Sex: 05-04-1941 (80 y.o. M) Treating RN: Donnamarie Poag Primary Care Ziair Penson: Dimas Chyle Other Clinician: Referring Tierney Behl: Dimas Chyle Treating Eladia Frame/Extender: Yaakov Guthrie in Treatment: 6 Encounter Discharge Information Items Post Procedure  Vitals Discharge Condition: Stable Temperature (F): 98.5 Ambulatory Status: Ambulatory Pulse (bpm): 73 Discharge Destination: Home Respiratory Rate (breaths/min): 16 Transportation: Private Auto Blood Pressure (mmHg): 123/74 Accompanied By: girlfriend Schedule Follow-up Appointment: Yes Clinical Summary of Care: Electronic Signature(s) Signed: 05/31/2021 1:09:54 PM By: Donnamarie Poag Entered By: Donnamarie Poag on 05/31/2021 10:59:05 [name] ([id number]) -------------------------------------------------------------------------------- Lower Extremity Assessment Details Patient Name: [name] Date of Service: 05/31/2021 9:30 AM Medical Record Number: [id number] Patient Account Number: [id number] Date of Birth/Sex: Feb 07, 1941 (80 y.o. M) Treating RN: Carlene Coria Primary Care Arabia Nylund: Dimas Chyle Other Clinician: Referring Sadiyah Kangas: Dimas Chyle Treating Tamma Brigandi/Extender: Yaakov Guthrie in Treatment: 6 Edema Assessment Assessed: [Left: Yes] [Right: Yes] [Left: Edema] [Right: :] Calf Left: Right: Point of Measurement: 36 cm From Medial Instep 34 cm Ankle Left: Right: Point of Measurement: 9 cm From Medial Instep 22 cm Vascular Assessment Pulses: Dorsalis Pedis Palpable: [Left:Yes] [Right:Yes] Electronic Signature(s) Signed: 06/02/2021 4:32:50 PM By: Carlene Coria RN Entered By: Carlene Coria on 05/31/2021 09:49:53 [name] ([id number]) -------------------------------------------------------------------------------- Multi Wound Chart Details Patient Name: [name] Date of Service: 05/31/2021 9:30 AM Medical Record Number: [id number] Patient Account Number: [id number] Date of Birth/Sex: 1940-12-20 (80 y.o. 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 N/A Extremity Comorbid History: Arrhythmia, Hypertension, Type II Arrhythmia, Hypertension, Type II N/A Diabetes, Received Radiation Diabetes, Received Radiation Date Acquired: 04/02/2021 05/25/2021 N/A Weeks of Treatment: 6 0 N/A Wound Status: Open Open N/A Measurements L x W x D (cm) 4x3x0.3 1.5x0.5x0.1 N/A Area (cm) : 9.425 0.589 N/A Volume (cm) : 2.827 0.059 N/A % Reduction  in Area: 42.90% N/A N/A % Reduction in Volume: 92.20% N/A N/A Position 1 (o'clock): 11 Maximum Distance 1 (cm): 0.5 Tunneling: Yes No N/A Classification: Grade 2 Full Thickness Without Exposed N/A Support Structures Exudate Amount: Medium Medium N/A Exudate Type: Serosanguineous Serosanguineous N/A Exudate Color: red, brown red, brown N/A Wound Margin: Well defined, not attached N/A N/A Granulation Amount: Large (67-100%) Large (67-100%) N/A Granulation Quality: Red, Pink Red, Pink N/A Necrotic Amount: Small (1-33%) None Present (0%) N/A Exposed Structures: Fat Layer (Subcutaneous Tissue): N/A N/A Yes Fascia: No Tendon: No Muscle: No Joint: No Bone: No Epithelialization: Small (1-33%) N/A N/A Debridement: N/A Chemical/Enzymatic/Mechanical N/A Pre-procedure Verification/Time N/A 10:43 N/A Out Taken: Instrument: N/A Other(saline gauze) N/A Bleeding: N/A Minimum N/A Hemostasis Achieved: N/A Pressure N/A Debridement Treatment N/A Procedure was tolerated well N/A Response: N/A 1.5x0.5x0.1 N/A KIARA, KEEP ([id number]) Post Debridement Measurements L x W x D (cm) Post Debridement Volume: N/A 0.059 N/A (cm) Procedures Performed: N/A Debridement N/A Treatment Notes Wound #1 (Lower Leg) Wound  Laterality: Left, Medial Cleanser Soap and Water Discharge Instruction: Gently cleanse wound with antibacterial soap, rinse and pat dry prior to dressing wounds Peri-Wound Care Desitin Maximum Strength Ointment 4 (oz) Discharge Instruction: GENEROUSLY Apply periwound Topical Santyl Collagenase Ointment, 30 (gm), tube Discharge Instruction: Apply nickel thick to wound bed Primary Dressing Hydrofera Blue Ready Transfer Foam, 4x5 (in/in) Discharge Instruction: use the rope for the tunnel Apply Hydrofera Blue Ready to wound bed as directed Secondary Dressing ABD Pad 5x9 (in/in) Discharge Instruction: Cover with ABD pad Kerlix 4.5 x 4.1 (in/yd) Discharge Instruction: OVER ABD PAD, Apply Kerlix 4.5 x 4.1 (in/yd) as instructed Secured With Coban Cohesive Bandage 4x5 (yds) Stretched Discharge Instruction: Apply Coban as directed AS TOP LAYER Compression Wrap Compression Stockings Add-Ons Wound #2 (Lower Leg) Wound Laterality: Right, Medial Cleanser Soap and Water Discharge Instruction: Gently cleanse wound with antibacterial soap, rinse and pat dry prior to dressing wounds Peri-Wound Care Topical Bacitracin Ointment, 1 (oz) tube Discharge Instruction: appy to right leg wound Primary Dressing Secondary Dressing Coverlet Latex-Free Fabric Adhesive Dressings Discharge Instruction: bandaid or similar to keep wound covered Secured With Compression Wrap Compression Stockings MARLEE, TRENTMAN ([id number]) Add-Ons Electronic Signature(s) Signed: 05/31/2021 11:04:53 AM By: Kalman Shan DO Entered By: Kalman Shan on 05/31/2021 10:58:33 [name] ([id number]) -------------------------------------------------------------------------------- [place] Details Patient Name: [name] Date of Service: 05/31/2021 9:30 AM Medical Record Number: [id number] Patient Account Number: [id number] Date of Birth/Sex: Sep 04, 1941 (80 y.o. M) Treating RN: Donnamarie Poag Primary Care Taliah Porche: Dimas Chyle Other Clinician: Referring Jazion Atteberry: Dimas Chyle Treating Yuji Walth/Extender: Yaakov Guthrie in Treatment: 6 Active Inactive Electronic Signature(s) Signed: 05/31/2021 1:09:54 PM By: Donnamarie Poag Entered By: Donnamarie Poag on 05/31/2021 10:42:01 [name] ([id number]) -------------------------------------------------------------------------------- Pain Assessment Details Patient Name: [name] Date of Service: 05/31/2021 9:30 AM Medical Record Number: [id number] Patient Account Number: [id number] Date of Birth/Sex: 09-29-1941 (80 y.o. M) Treating RN: Carlene Coria Primary Care Qais Jowers: Dimas Chyle Other Clinician: Referring Alvey Brockel: Dimas Chyle Treating Duke Weisensel/Extender: Yaakov Guthrie in Treatment: 6 Active Problems Location of Pain Severity and Description of Pain Patient Has Paino No Site Locations Rate the pain. Current Pain Level: 0 Pain Management and Medication Current Pain Management: Electronic Signature(s) Signed: 06/02/2021 4:32:50 PM By: Carlene Coria RN Entered By: Carlene Coria on 05/31/2021 09:41:22 [name] ([id number]) -------------------------------------------------------------------------------- Patient/Caregiver Education Details Patient Name: [name] Date of Service: 05/31/2021 9:30 AM Medical Record Number: [id number] Patient Account Number: [id number] Date of Birth/Gender: [birth year] (80 y.o.  M) Treating RN: Donnamarie Poag Primary Care Physician: Dimas Chyle Other Clinician: Referring Physician: Dimas Chyle Treating Physician/Extender: Yaakov Guthrie in Treatment: 6 Education Assessment Education Provided To: Patient Education Topics Provided Basic Hygiene: Venous: Wound/Skin Impairment: Electronic Signature(s) Signed: 05/31/2021 1:09:54 PM By: Donnamarie Poag Entered By: Donnamarie Poag on 05/31/2021 10:50:13 [name]  ([id number]) -------------------------------------------------------------------------------- Wound Assessment Details Patient Name: [name] Date of Service: 05/31/2021 9:30 AM Medical Record Number: [id number] Patient Account Number: [id number] Date of Birth/Sex: 10-29-1941 (80 y.o. M) Treating RN: Carlene Coria Primary Care Grover Robinson: Dimas Chyle Other Clinician: Referring Micah Barnier: Dimas Chyle Treating Jonise Weightman/Extender: Yaakov Guthrie in Treatment: 6 Wound Status Wound Number: 1 Primary Diabetic Wound/Ulcer of the Lower Extremity Etiology: Wound Location: Left, Medial Lower Leg Wound Status: Open Wounding Event: Trauma Comorbid Arrhythmia, Hypertension, Type II Diabetes, Received Date Acquired: 04/02/2021 History: Radiation Weeks Of Treatment: 6 Clustered Wound: No Photos Wound Measurements Length: (cm) 4 % Re Width: (cm) 3 % Re Depth: (cm) 0.3 Epit Area: (cm) 9.425 Tun Volume: (cm) 2.827 M duction in Area: 42.9% duction in Volume: 92.2% helialization: Small (1-33%) neling: Yes Position (o'clock): 11 aximum Distance: (cm) 0.5 Undermining: No Wound Description Classification: Grade 2 Foul Wound Margin: Well defined, not attached Slou Exudate Amount: Medium Exudate Type: Serosanguineous Exudate Color: red, brown Odor After Cleansing: No gh/Fibrino Yes Wound Bed Granulation Amount: Large (67-100%) Exposed Structure Granulation Quality: Red, Pink Fascia Exposed: No Necrotic Amount: Small (1-33%) Fat Layer (Subcutaneous Tissue) Exposed: Yes Necrotic Quality: Adherent Slough Tendon Exposed: No Muscle Exposed: No Joint Exposed: No Bone Exposed: No Treatment Notes Wound #1 (Lower Leg) Wound Laterality: Left, Medial Cleanser Soap and Water RAUNAK, ANTUNA ([id number]) Discharge Instruction: Gently cleanse wound with antibacterial soap, rinse and pat dry prior to dressing wounds Peri-Wound Care Desitin Maximum Strength Ointment 4 (oz) Discharge  Instruction: GENEROUSLY Apply periwound Topical Santyl Collagenase Ointment, 30 (gm), tube Discharge Instruction: Apply nickel thick to wound bed Primary Dressing Hydrofera Blue Ready Transfer Foam, 4x5 (in/in) Discharge Instruction: use the rope for the tunnel Apply Hydrofera Blue Ready to wound bed as directed Secondary Dressing ABD Pad 5x9 (in/in) Discharge Instruction: Cover with ABD pad Kerlix 4.5 x 4.1 (in/yd) Discharge Instruction: OVER ABD PAD, Apply Kerlix 4.5 x 4.1 (in/yd) as instructed Secured With Coban Cohesive Bandage 4x5 (yds) Stretched Discharge Instruction: Apply Coban as directed AS TOP LAYER Compression Wrap Compression Stockings Add-Ons Electronic Signature(s) Signed: 06/02/2021 4:32:50 PM By: Carlene Coria RN Entered By: Carlene Coria on 05/31/2021 09:45:49 [name] ([id number]) -------------------------------------------------------------------------------- Wound Assessment Details Patient Name: [name] Date of Service: 05/31/2021 9:30 AM Medical Record Number: [id number] Patient Account Number: [id number] Date of Birth/Sex: [birth year] (80 y.o. M) Treating RN: Carlene Coria Primary Care Hymie Gorr: Dimas Chyle Other Clinician: Referring Lukisha Procida: Dimas Chyle Treating Shawna Wearing/Extender: Yaakov Guthrie in Treatment: 6 Wound Status Wound Number: 2 Primary Venous Leg Ulcer Etiology: Wound Location: Right, Medial Lower Leg Wound Status: Open Wounding Event: Blister Comorbid Arrhythmia, Hypertension, Type II Diabetes, Received Date Acquired: 05/25/2021 History: Radiation Weeks Of Treatment: 0 Clustered Wound: No Photos Wound Measurements Length: (cm) 1.5 Width: (cm) 0.5 Depth: (cm) 0.1 Area: (cm) 0.589 Volume: (cm) 0.059 % Reduction in Area: % Reduction in Volume: Tunneling: No Undermining: No Wound Description Classification: Full Thickness Without Exposed Support Structu Exudate Amount: Medium Exudate Type:  Serosanguineous Exudate Color: red, brown res Foul Odor After Cleansing: No Slough/Fibrino No Wound Bed Granulation Amount: Large (67-100%) Granulation Quality: Red, Pink Necrotic Amount: None Present (0%) Treatment Notes Wound #  2 (Lower Leg) Wound Laterality: Right, Medial Cleanser Soap and Water Discharge Instruction: Gently cleanse wound with antibacterial soap, rinse and pat dry prior to dressing wounds Peri-Wound Care Topical Bacitracin Ointment, 1 (oz) tube Discharge Instruction: appy to right leg wound MANJOT, HINKS ([id number]) Primary Dressing Secondary Dressing Coverlet Latex-Free Fabric Adhesive Dressings Discharge Instruction: bandaid or similar to keep wound covered Secured With Compression Wrap Compression Stockings Add-Ons Electronic Signature(s) Signed: 06/02/2021 4:32:50 PM By: Carlene Coria RN Entered By: Carlene Coria on 05/31/2021 09:47:21 [name] ([id number]) -------------------------------------------------------------------------------- [place] Details Patient Name: [name] Date of Service: 05/31/2021 9:30 AM Medical Record Number: [id number] Patient Account Number: [id number] Date of Birth/Sex: Mar 13, 1941 (80 y.o. M) Treating RN: Carlene Coria Primary Care Jadalyn Oliveri: Dimas Chyle Other Clinician: Referring Helyne Genther: Dimas Chyle Treating Amaranta Mehl/Extender: Yaakov Guthrie in Treatment: 6 Vital Signs Time Taken: 09:40 Temperature (F): 98.5 Height (in): 68 Pulse (bpm): 73 Weight (lbs): 180 Respiratory Rate (breaths/min): 16 Body Mass Index (BMI): 27.4 Blood Pressure (mmHg): 123/74 Reference Range: 80 - 120 mg / dl Electronic Signature(s) Signed: 06/02/2021 4:32:50 PM By: Carlene Coria RN Entered By: Carlene Coria on 05/31/2021 09:40:32

## 2021-06-06 ENCOUNTER — Ambulatory Visit: Payer: Medicare Other | Admitting: Family Medicine

## 2021-06-07 ENCOUNTER — Other Ambulatory Visit: Payer: Self-pay

## 2021-06-07 ENCOUNTER — Encounter: Payer: Medicare Other | Attending: Internal Medicine | Admitting: Internal Medicine

## 2021-06-07 DIAGNOSIS — N183 Chronic kidney disease, stage 3 unspecified: Secondary | ICD-10-CM | POA: Insufficient documentation

## 2021-06-07 DIAGNOSIS — I872 Venous insufficiency (chronic) (peripheral): Secondary | ICD-10-CM | POA: Diagnosis not present

## 2021-06-07 DIAGNOSIS — E1122 Type 2 diabetes mellitus with diabetic chronic kidney disease: Secondary | ICD-10-CM | POA: Insufficient documentation

## 2021-06-07 DIAGNOSIS — S8012XD Contusion of left lower leg, subsequent encounter: Secondary | ICD-10-CM | POA: Diagnosis not present

## 2021-06-07 DIAGNOSIS — Z87891 Personal history of nicotine dependence: Secondary | ICD-10-CM | POA: Insufficient documentation

## 2021-06-07 DIAGNOSIS — E11622 Type 2 diabetes mellitus with other skin ulcer: Secondary | ICD-10-CM | POA: Diagnosis not present

## 2021-06-07 DIAGNOSIS — I129 Hypertensive chronic kidney disease with stage 1 through stage 4 chronic kidney disease, or unspecified chronic kidney disease: Secondary | ICD-10-CM | POA: Insufficient documentation

## 2021-06-07 DIAGNOSIS — Z8616 Personal history of COVID-19: Secondary | ICD-10-CM | POA: Insufficient documentation

## 2021-06-07 DIAGNOSIS — L97829 Non-pressure chronic ulcer of other part of left lower leg with unspecified severity: Secondary | ICD-10-CM | POA: Diagnosis present

## 2021-06-07 DIAGNOSIS — S81801D Unspecified open wound, right lower leg, subsequent encounter: Secondary | ICD-10-CM | POA: Insufficient documentation

## 2021-06-07 DIAGNOSIS — L97822 Non-pressure chronic ulcer of other part of left lower leg with fat layer exposed: Secondary | ICD-10-CM | POA: Diagnosis not present
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
## 2021-06-07 NOTE — Progress Notes (Signed)
TEODOR, PRATER ([id number]) Visit Report for 06/07/2021 Arrival Information Details Patient Name: [name], [name] Date of Service: 06/07/2021 8:45 AM Medical Record Number: [id number] Patient Account Number: [id number] Date of Birth/Sex: 14-Dec-1940 (80 y.o. M) Treating RN: Donnamarie Poag Primary Care Johnell Bas: Dimas Chyle Other Clinician: Referring Insiya Oshea: Dimas Chyle Treating Schelly Chuba/Extender: Yaakov Guthrie in Treatment: 7 Visit Information History Since Last Visit Added or deleted any medications: No Patient Arrived: Ambulatory Had a fall or experienced change in No Arrival Time: 08:50 activities of daily living that may affect Accompanied By: girlfriend risk of falls: Transfer Assistance: None Hospitalized since last visit: No Patient Identification Verified: Yes Has Dressing in Place as Prescribed: Yes Secondary Verification Process Completed: Yes Pain Present Now: No Patient Requires Transmission-Based No Precautions: Patient Has Alerts: Yes Patient Alerts: Patient on Blood Thinner DIABETIC Electronic Signature(s) Signed: 06/07/2021 3:01:59 PM By: Donnamarie Poag Entered By: Donnamarie Poag on 06/07/2021 08:53:22 [name] ([id number]) -------------------------------------------------------------------------------- Clinic Level of Care Assessment Details Patient Name: [name] Date of Service: 06/07/2021 8:45 AM Medical Record Number: [id number] Patient Account Number: [id number] Date of Birth/Sex: May 09, 1941 (80 y.o. M) Treating RN: Donnamarie Poag Primary Care Tyus Kallam: Dimas Chyle Other Clinician: Referring Elisandro Jarrett: Dimas Chyle Treating Quentez Lober/Extender: Yaakov Guthrie in Treatment: 7 Clinic Level of Care Assessment Items TOOL 4 Quantity Score []  - Use when only an EandM is performed on FOLLOW-UP visit 0 ASSESSMENTS - Nursing Assessment / Reassessment []  - Reassessment of Co-morbidities (includes updates in patient status) 0 []  - 0 Reassessment  of Adherence to Treatment Plan ASSESSMENTS - Wound and Skin Assessment / Reassessment []  - Simple Wound Assessment / Reassessment - one wound 0 X- 2 5 Complex Wound Assessment / Reassessment - multiple wounds []  - 0 Dermatologic / Skin Assessment (not related to wound area) ASSESSMENTS - Focused Assessment []  - Circumferential Edema Measurements - multi extremities 0 []  - 0 Nutritional Assessment / Counseling / Intervention []  - 0 Lower Extremity Assessment (monofilament, tuning fork, pulses) []  - 0 Peripheral Arterial Disease Assessment (using hand held doppler) ASSESSMENTS - Ostomy and/or Continence Assessment and Care []  - Incontinence Assessment and Management 0 []  - 0 Ostomy Care Assessment and Management (repouching, etc.) PROCESS - Coordination of Care X - Simple Patient / Family Education for ongoing care 1 15 []  - 0 Complex (extensive) Patient / Family Education for ongoing care []  - 0 Staff obtains Programmer, systems, Records, Test Results / Process Orders []  - 0 Staff telephones HHA, Nursing Homes / Clarify orders / etc []  - 0 Routine Transfer to another Facility (non-emergent condition) []  - 0 Routine Hospital Admission (non-emergent condition) []  - 0 New Admissions / Biomedical engineer / Ordering NPWT, Apligraf, etc. []  - 0 Emergency Hospital Admission (emergent condition) X- 1 10 Simple Discharge Coordination []  - 0 Complex (extensive) Discharge Coordination PROCESS - Special [name] []  - Pediatric / Minor Patient Management 0 []  - 0 Isolation Patient Management []  - 0 Hearing / Language / Visual special [name] []  - 0 Assessment of Community assistance (transportation, D/C planning, etc.) []  - 0 Additional assistance / Altered mentation []  - 0 Support Surface(s) Assessment (bed, cushion, seat, etc.) INTERVENTIONS - Wound Cleansing / Measurement JAKOBI, THETFORD ([id number]) []  - 0 Simple Wound Cleansing - one wound X- 2 5 Complex Wound Cleansing - multiple  wounds []  - 0 Wound Imaging (photographs - any number of wounds) []  - 0 Wound Tracing (instead of photographs) []  - 0 Simple Wound Measurement - one wound X- 2 5 Complex Wound Measurement -  multiple wounds INTERVENTIONS - Wound Dressings []  - Small Wound Dressing one or multiple wounds 0 X- 2 15 Medium Wound Dressing one or multiple wounds []  - 0 Large Wound Dressing one or multiple wounds []  - 0 Application of Medications - topical []  - 0 Application of Medications - injection INTERVENTIONS - Miscellaneous []  - External ear exam 0 []  - 0 Specimen Collection (cultures, biopsies, blood, body fluids, etc.) []  - 0 Specimen(s) / Culture(s) sent or taken to Lab for analysis []  - 0 Patient Transfer (multiple staff / Civil Service fast streamer / Similar devices) []  - 0 Simple Staple / Suture removal (25 or less) []  - 0 Complex Staple / Suture removal (26 or more) []  - 0 Hypo / Hyperglycemic Management (close monitor of Blood Glucose) []  - 0 Ankle / Brachial Index (ABI) - do not check if billed separately X- 1 5 Vital Signs Has the patient been seen at the hospital within the last three years: Yes Total Score: 90 Level Of Care: New/Established - Level 3 Electronic Signature(s) Signed: 06/07/2021 3:01:59 PM By: Donnamarie Poag Entered By: Donnamarie Poag on 06/07/2021 [place], Darnel ([id number]) -------------------------------------------------------------------------------- Encounter Discharge Information Details Patient Name: [name] Date of Service: 06/07/2021 8:45 AM Medical Record Number: [id number] Patient Account Number: [id number] Date of Birth/Sex: 10/01/1941 (80 y.o. M) Treating RN: Donnamarie Poag Primary Care Duane Trias: Dimas Chyle Other Clinician: Referring Aurthur Wingerter: Dimas Chyle Treating Fadia Marlar/Extender: Yaakov Guthrie in Treatment: 7 Encounter Discharge Information Items Discharge Condition: Stable Ambulatory Status: Ambulatory Discharge Destination:  Home Transportation: Private Auto Accompanied By: girlfriend Schedule Follow-up Appointment: Yes Clinical Summary of Care: Electronic Signature(s) Signed: 06/07/2021 3:01:59 PM By: Donnamarie Poag Entered By: Donnamarie Poag on 06/07/2021 09:22:32 [name] ([id number]) -------------------------------------------------------------------------------- Lower Extremity Assessment Details Patient Name: [name] Date of Service: 06/07/2021 8:45 AM Medical Record Number: [id number] Patient Account Number: [id number] Date of Birth/Sex: 1941-09-17 (80 y.o. M) Treating RN: Donnamarie Poag Primary Care Yuktha Kerchner: Dimas Chyle Other Clinician: Referring Kourtnee Lahey: Dimas Chyle Treating Alphia Behanna/Extender: Yaakov Guthrie in Treatment: 7 Edema Assessment Assessed: [Left: Yes] [Right: Yes] Edema: [Left: No] [Right: No] Calf Left: Right: Point of Measurement: 36 cm From Medial Instep 34 cm Ankle Left: Right: Point of Measurement: 9 cm From Medial Instep 22 cm Vascular Assessment Pulses: Dorsalis Pedis Palpable: [Left:Yes] [Right:Yes] Electronic Signature(s) Signed: 06/07/2021 3:01:59 PM By: Donnamarie Poag Entered By: Donnamarie Poag on 06/07/2021 09:03:15 [name] ([id number]) -------------------------------------------------------------------------------- Multi Wound Chart Details Patient Name: [name] Date of Service: 06/07/2021 8:45 AM Medical Record Number: [id number] Patient Account Number: [id number] Date of Birth/Sex: [birth year] (80 y.o. 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 N/A Extremity Comorbid History: Arrhythmia, Hypertension, Type II Arrhythmia, Hypertension, Type II N/A Diabetes, Received Radiation Diabetes, Received Radiation Date Acquired: 04/02/2021 05/25/2021 N/A Weeks of Treatment: 7 1 N/A Wound Status: Open Open N/A Measurements L x W x D (cm) 3.7x1.9x0.3 0.1x0.1x0.1 N/A Area (cm) : 5.521 0.008 N/A Volume (cm) : 1.656 0.001 N/A % Reduction in Area: 66.50% 98.60% N/A % Reduction in Volume: 95.40% 98.30% N/A Position 1 (o'clock): 11 Maximum Distance 1 (cm): 0.4 Tunneling: Yes No N/A Classification:  Grade 2 Full Thickness Without Exposed N/A Support Structures Exudate Amount: Medium Medium N/A Exudate Type: Serosanguineous Serosanguineous N/A Exudate Color: red, brown red, brown N/A Wound Margin: Well defined, not attached N/A N/A Granulation Amount: Large (67-100%) None Present (0%) N/A Granulation Quality: Red, Pink N/A N/A Necrotic Amount: Small (1-33%) None Present (0%) N/A Exposed Structures: Fat Layer (Subcutaneous Tissue): N/A N/A Yes Fascia: No Tendon: No Muscle: No Joint: No Bone: No Epithelialization: Small (1-33%) Large (67-100%) N/A Treatment Notes Wound #1 (Lower Leg) Wound Laterality: Left, Medial Cleanser Soap and Water MARTEZE, VECCHIO ([id number]) Discharge Instruction: Gently cleanse wound with antibacterial soap, rinse and pat dry prior to dressing wounds Peri-Wound Care Desitin Maximum Strength Ointment 4 (oz) Discharge Instruction: GENEROUSLY Apply periwound Topical Santyl Collagenase Ointment, 30 (gm), tube Discharge Instruction: Apply nickel thick to wound bed Primary Dressing Hydrofera Blue Ready Transfer Foam, 4x5 (in/in) Discharge Instruction: use the rope for the tunnel Apply Hydrofera Blue Ready to wound bed as directed Secondary Dressing ABD Pad 5x9 (in/in) Discharge Instruction: Cover with ABD  pad Kerlix 4.5 x 4.1 (in/yd) Discharge Instruction: OVER ABD PAD, Apply Kerlix 4.5 x 4.1 (in/yd) as instructed Secured With Coban Cohesive Bandage 4x5 (yds) Stretched Discharge Instruction: Apply Coban as directed AS TOP LAYER Compression Wrap Compression Stockings Add-Ons Wound #2 (Lower Leg) Wound Laterality: Right, Medial Cleanser Soap and Water Discharge Instruction: Gently cleanse wound with antibacterial soap, rinse and pat dry prior to dressing wounds Peri-Wound Care Topical Bacitracin Ointment, 1 (oz) tube Discharge Instruction: appy to right leg wound Primary Dressing Secondary Dressing Coverlet Latex-Free Fabric Adhesive Dressings Discharge Instruction: bandaid or similar to keep wound covered Secured With Compression Wrap Compression Stockings Add-Ons Electronic Signature(s) Signed: 06/07/2021 9:31:43 AM By: Kalman Shan DO Entered By: Kalman Shan on 06/07/2021 09:26:20 [name] ([id number]) -------------------------------------------------------------------------------- Multi-Disciplinary Care Plan Details Patient Name: [name] Date of Service: 06/07/2021 8:45 AM Medical Record Number: [id number] Patient Account Number: [id number] Date of Birth/Sex: 09-25-1941 (80 y.o. M) Treating RN: Donnamarie Poag Primary Care Avy Barlett: Dimas Chyle Other Clinician: Referring Vedh Ptacek: Dimas Chyle Treating Delainie Chavana/Extender: Yaakov Guthrie in Treatment: 7 Active Inactive Electronic Signature(s) Signed: 06/07/2021 3:01:59 PM By: Donnamarie Poag Entered By: Donnamarie Poag on 06/07/2021 09:08:18 [name] ([id number]) -------------------------------------------------------------------------------- Pain Assessment Details Patient Name: [name] Date of Service: 06/07/2021 8:45 AM Medical Record Number: [id number] Patient Account Number: [id number] Date of Birth/Sex: [birth year] (80 y.o. M) Treating RN: Donnamarie Poag Primary Care Anyssa Sharpless: Dimas Chyle  Other Clinician: Referring Stan Cantave: Dimas Chyle Treating Hershell Brandl/Extender: Yaakov Guthrie in Treatment: 7 Active Problems Location of Pain Severity and Description of Pain Patient Has Paino No Site Locations Rate the pain. Current Pain Level: 0 Pain Management and Medication Current Pain Management: Electronic Signature(s) Signed: 06/07/2021 3:01:59 PM By: Donnamarie Poag Entered By: Donnamarie Poag on 06/07/2021 08:54:08 [name] ([id number]) -------------------------------------------------------------------------------- Patient/Caregiver Education Details Patient Name: [name] Date of Service: 06/07/2021 8:45 AM Medical Record Number: [id number] Patient Account Number: [id number] Date of Birth/Gender: [birth year] (80 y.o. M) Treating RN: Donnamarie Poag Primary Care Physician: Dimas Chyle Other Clinician: Referring Physician: Dimas Chyle Treating Physician/Extender: Yaakov Guthrie in Treatment: 7 Education Assessment Education Provided To: Patient Education Topics Provided Basic Hygiene: Venous: Wound/Skin Impairment: Electronic Signature(s) Signed: 06/07/2021 3:01:59 PM By: Donnamarie Poag Entered By: Donnamarie Poag on 06/07/2021 09:10:26 [name] ([id number]) -------------------------------------------------------------------------------- Wound Assessment Details Patient Name: [name] Date of Service: 06/07/2021 8:45 AM Medical Record Number: [id number] Patient Account Number: [id number] Date of Birth/Sex: 07-13-1941 (80 y.o. M) Treating  RN: Donnamarie Poag Primary Care Aeon Koors: Dimas Chyle Other Clinician: Referring Mareta Chesnut: Dimas Chyle Treating Bastian Andreoli/Extender: Yaakov Guthrie in Treatment: 7 Wound Status Wound Number: 1 Primary Diabetic Wound/Ulcer of the Lower Extremity Etiology: Wound Location: Left, Medial Lower Leg Wound Status: Open Wounding Event: Trauma Comorbid Arrhythmia, Hypertension, Type II Diabetes, Received Date  Acquired: 04/02/2021 History: Radiation Weeks Of Treatment: 7 Clustered Wound: No Photos Wound Measurements Length: (cm) 3.7 % Red Width: (cm) 1.9 % Red Depth: (cm) 0.3 Epith Area: (cm) 5.521 Tunn Volume: (cm) 1.656 P Ma uction in Area: 66.5% uction in Volume: 95.4% elialization: Small (1-33%) eling: Yes osition (o'clock): 11 ximum Distance: (cm) 0.4 Undermining: No Wound Description Classification: Grade 2 Foul Wound Margin: Well defined, not attached Slou Exudate Amount: Medium Exudate Type: Serosanguineous Exudate Color: red, brown Odor After Cleansing: No gh/Fibrino Yes Wound Bed Granulation Amount: Large (67-100%) Exposed Structure Granulation Quality: Red, Pink Fascia Exposed: No Necrotic Amount: Small (1-33%) Fat Layer (Subcutaneous Tissue) Exposed: Yes Necrotic Quality: Adherent Slough Tendon Exposed: No Muscle Exposed: No Joint Exposed: No Bone Exposed: No Treatment Notes Wound #1 (Lower Leg) Wound Laterality: Left, Medial Cleanser Soap and Water WANG, GRANADA ([id number]) Discharge Instruction: Gently cleanse wound with antibacterial soap, rinse and pat dry prior to dressing wounds Peri-Wound Care Desitin Maximum Strength Ointment 4 (oz) Discharge Instruction: GENEROUSLY Apply periwound Topical Santyl Collagenase Ointment, 30 (gm), tube Discharge Instruction: Apply nickel thick to wound bed Primary Dressing Hydrofera Blue Ready Transfer Foam, 4x5 (in/in) Discharge Instruction: use the rope for the tunnel Apply Hydrofera Blue Ready to wound bed as directed Secondary Dressing ABD Pad 5x9 (in/in) Discharge Instruction: Cover with ABD pad Kerlix 4.5 x 4.1 (in/yd) Discharge Instruction: OVER ABD PAD, Apply Kerlix 4.5 x 4.1 (in/yd) as instructed Secured With Coban Cohesive Bandage 4x5 (yds) Stretched Discharge Instruction: Apply Coban as directed AS TOP LAYER Compression Wrap Compression Stockings Add-Ons Electronic Signature(s) Signed: 06/07/2021  3:01:59 PM By: Donnamarie Poag Entered By: Donnamarie Poag on 06/07/2021 09:00:58 [name] ([id number]) -------------------------------------------------------------------------------- Wound Assessment Details Patient Name: [name] Date of Service: 06/07/2021 8:45 AM Medical Record Number: [id number] Patient Account Number: [id number] Date of Birth/Sex: 10-30-1941 (80 y.o. M) Treating RN: Donnamarie Poag Primary Care Steen Bisig: Dimas Chyle Other Clinician: Referring Anida Deol: Dimas Chyle Treating Virginia Curl/Extender: Yaakov Guthrie in Treatment: 7 Wound Status Wound Number: 2 Primary Venous Leg Ulcer Etiology: Wound Location: Right, Medial Lower Leg Wound Status: Open Wounding Event: Blister Comorbid Arrhythmia, Hypertension, Type II Diabetes, Received Date Acquired: 05/25/2021 History: Radiation Weeks Of Treatment: 1 Clustered Wound: No Photos Wound Measurements Length: (cm) 0.1 Width: (cm) 0.1 Depth: (cm) 0.1 Area: (cm) 0.008 Volume: (cm) 0.001 % Reduction in Area: 98.6% % Reduction in Volume: 98.3% Epithelialization: Large (67-100%) Tunneling: No Undermining: No Wound Description Classification: Full Thickness Without Exposed Support Structu Exudate Amount: Medium Exudate Type: Serosanguineous Exudate Color: red, brown res Foul Odor After Cleansing: No Slough/Fibrino No Wound Bed Granulation Amount: None Present (0%) Necrotic Amount: None Present (0%) Treatment Notes Wound #2 (Lower Leg) Wound Laterality: Right, Medial Cleanser Soap and Water Discharge Instruction: Gently cleanse wound with antibacterial soap, rinse and pat dry prior to dressing wounds Peri-Wound Care Topical Bacitracin Ointment, 1 (oz) tube Discharge Instruction: appy to right leg wound Primary Dressing ZACORY, FIOLA ([id number]) Secondary Dressing Coverlet Latex-Free Fabric Adhesive Dressings Discharge Instruction: bandaid or similar to keep wound covered Secured  With Compression Wrap Compression Stockings Add-Ons Electronic Signature(s) Signed: 06/07/2021 3:01:59 PM By: Donnamarie Poag Entered ByDonnamarie Poag on 06/07/2021  [place], [place] ([id number]) -------------------------------------------------------------------------------- Vitals Details Patient Name: SYMON, NORWOOD Date of Service: 06/07/2021 8:45 AM Medical Record Number: [id number] Patient Account Number: [id number] Date of Birth/Sex: [birth year] (80 y.o. M) Treating RN: Donnamarie Poag Primary Care Jasmia Angst: Dimas Chyle Other Clinician: Referring Kali Deadwyler: Dimas Chyle Treating Jode Lippe/Extender: Yaakov Guthrie in Treatment: 7 Vital Signs Time Taken: 08:52 Temperature (F): 97.5 Height (in): 68 Pulse (bpm): 91 Weight (lbs): 180 Respiratory Rate (breaths/min): 16 Body Mass Index (BMI): 27.4 Blood Pressure (mmHg): 117/70 Reference Range: 80 - 120 mg / dl Electronic Signature(s) Signed: 06/07/2021 3:01:59 PM By: Donnamarie Poag Entered ByDonnamarie Poag on 06/07/2021 08:53:57

## 2021-06-13 ENCOUNTER — Other Ambulatory Visit: Payer: Self-pay

## 2021-06-13 DIAGNOSIS — E1122 Type 2 diabetes mellitus with diabetic chronic kidney disease: Secondary | ICD-10-CM | POA: Diagnosis not present

## 2021-06-13 DIAGNOSIS — Z87891 Personal history of nicotine dependence: Secondary | ICD-10-CM | POA: Diagnosis not present

## 2021-06-13 DIAGNOSIS — Z8616 Personal history of COVID-19: Secondary | ICD-10-CM | POA: Diagnosis not present

## 2021-06-13 DIAGNOSIS — E785 Hyperlipidemia, unspecified: Secondary | ICD-10-CM | POA: Diagnosis not present

## 2021-06-13 DIAGNOSIS — I4891 Unspecified atrial fibrillation: Secondary | ICD-10-CM | POA: Diagnosis not present

## 2021-06-13 DIAGNOSIS — E11622 Type 2 diabetes mellitus with other skin ulcer: Secondary | ICD-10-CM | POA: Diagnosis not present

## 2021-06-13 DIAGNOSIS — N183 Chronic kidney disease, stage 3 unspecified: Secondary | ICD-10-CM | POA: Diagnosis not present

## 2021-06-13 DIAGNOSIS — I129 Hypertensive chronic kidney disease with stage 1 through stage 4 chronic kidney disease, or unspecified chronic kidney disease: Secondary | ICD-10-CM | POA: Diagnosis not present

## 2021-06-13 DIAGNOSIS — S8012XD Contusion of left lower leg, subsequent encounter: Secondary | ICD-10-CM | POA: Diagnosis not present

## 2021-06-13 DIAGNOSIS — S81801D Unspecified open wound, right lower leg, subsequent encounter: Secondary | ICD-10-CM | POA: Diagnosis not present

## 2021-06-13 DIAGNOSIS — E1169 Type 2 diabetes mellitus with other specified complication: Secondary | ICD-10-CM | POA: Diagnosis not present

## 2021-06-13 DIAGNOSIS — L97822 Non-pressure chronic ulcer of other part of left lower leg with fat layer exposed: Secondary | ICD-10-CM | POA: Diagnosis not present

## 2021-06-13 DIAGNOSIS — S81802D Unspecified open wound, left lower leg, subsequent encounter: Secondary | ICD-10-CM | POA: Diagnosis not present

## 2021-06-13 DIAGNOSIS — I872 Venous insufficiency (chronic) (peripheral): Secondary | ICD-10-CM | POA: Diagnosis not present

## 2021-06-13 DIAGNOSIS — I10 Essential (primary) hypertension: Secondary | ICD-10-CM | POA: Diagnosis not present

## 2021-06-13 NOTE — Progress Notes (Signed)
[name], [name] ([id number]) Visit Report for 06/13/2021 Arrival Information Details Patient Name: [name], [name] Date of Service: 06/13/2021 9:00 AM Medical Record Number: [id number] Patient Account Number: [id number] Date of Birth/Sex: June 02, 1941 (80 y.o. M) Treating RN: Donnamarie Poag Primary Care Leni Pankonin: London Pepper Other Clinician: Referring Delorus Langwell: Dimas Chyle Treating Hildreth Orsak/Extender: Skipper Cliche in Treatment: 7 Visit Information History Since Last Visit Added or deleted any medications: No Patient Arrived: Ambulatory Had a fall or experienced change in No Arrival Time: 08:48 activities of daily living that may affect Accompanied By: self risk of falls: Transfer Assistance: None Hospitalized since last visit: No Patient Identification Verified: Yes Has Dressing in Place as Prescribed: Yes Secondary Verification Process Completed: Yes Has Compression in Place as Prescribed: Yes Patient Requires Transmission-Based No Pain Present Now: No Precautions: Patient Has Alerts: Yes Patient Alerts: Patient on Blood Thinner DIABETIC Electronic Signature(s) Signed: 06/13/2021 1:00:34 PM By: Donnamarie Poag Entered By: Donnamarie Poag on 06/13/2021 08:48:43 [name] ([id number]) -------------------------------------------------------------------------------- Clinic Level of Care Assessment Details Patient Name: [name] Date of Service: 06/13/2021 9:00 AM Medical Record Number: [id number] Patient Account Number: [id number] Date of Birth/Sex: 1941/01/22 (80 y.o. M) Treating RN: Donnamarie Poag Primary Care Artrice Kraker: London Pepper Other Clinician: Referring Geroldine Esquivias: Dimas Chyle Treating Mellany Dinsmore/Extender: Skipper Cliche in Treatment: 7 Clinic Level of Care Assessment Items TOOL 4 Quantity Score []  - Use when only an EandM is performed on FOLLOW-UP visit 0 ASSESSMENTS - Nursing Assessment / Reassessment []  - Reassessment of Co-morbidities (includes updates in patient  status) 0 []  - 0 Reassessment of Adherence to Treatment Plan ASSESSMENTS - Wound and Skin Assessment / Reassessment []  - Simple Wound Assessment / Reassessment - one wound 0 X- 2 5 Complex Wound Assessment / Reassessment - multiple wounds []  - 0 Dermatologic / Skin Assessment (not related to wound area) ASSESSMENTS - Focused Assessment []  - Circumferential Edema Measurements - multi extremities 0 []  - 0 Nutritional Assessment / Counseling / Intervention []  - 0 Lower Extremity Assessment (monofilament, tuning fork, pulses) []  - 0 Peripheral Arterial Disease Assessment (using hand held doppler) ASSESSMENTS - Ostomy and/or Continence Assessment and Care []  - Incontinence Assessment and Management 0 []  - 0 Ostomy Care Assessment and Management (repouching, etc.) PROCESS - Coordination of Care X - Simple Patient / Family Education for ongoing care 1 15 []  - 0 Complex (extensive) Patient / Family Education for ongoing care []  - 0 Staff obtains Programmer, systems, Records, Test Results / Process Orders []  - 0 Staff telephones HHA, Nursing Homes / Clarify orders / etc []  - 0 Routine Transfer to another Facility (non-emergent condition) []  - 0 Routine Hospital Admission (non-emergent condition) []  - 0 New Admissions / Biomedical engineer / Ordering NPWT, Apligraf, etc. []  - 0 Emergency Hospital Admission (emergent condition) X- 1 10 Simple Discharge Coordination []  - 0 Complex (extensive) Discharge Coordination PROCESS - Special [name] []  - Pediatric / Minor Patient Management 0 []  - 0 Isolation Patient Management []  - 0 Hearing / Language / Visual special [name] []  - 0 Assessment of Community assistance (transportation, D/C planning, etc.) []  - 0 Additional assistance / Altered mentation []  - 0 Support Surface(s) Assessment (bed, cushion, seat, etc.) INTERVENTIONS - Wound Cleansing / Measurement [name], [name] ([id number]) []  - 0 Simple Wound Cleansing - one wound X- 2  5 Complex Wound Cleansing - multiple wounds []  - 0 Wound Imaging (photographs - any number of wounds) []  - 0 Wound Tracing (instead of photographs) []  - 0 Simple Wound Measurement - one  wound X- 2 5 Complex Wound Measurement - multiple wounds INTERVENTIONS - Wound Dressings []  - Small Wound Dressing one or multiple wounds 0 X- 2 15 Medium Wound Dressing one or multiple wounds []  - 0 Large Wound Dressing one or multiple wounds []  - 0 Application of Medications - topical []  - 0 Application of Medications - injection INTERVENTIONS - Miscellaneous []  - External ear exam 0 []  - 0 Specimen Collection (cultures, biopsies, blood, body fluids, etc.) []  - 0 Specimen(s) / Culture(s) sent or taken to Lab for analysis []  - 0 Patient Transfer (multiple staff / Civil Service fast streamer / Similar devices) []  - 0 Simple Staple / Suture removal (25 or less) []  - 0 Complex Staple / Suture removal (26 or more) []  - 0 Hypo / Hyperglycemic Management (close monitor of Blood Glucose) []  - 0 Ankle / Brachial Index (ABI) - do not check if billed separately []  - 0 Vital Signs Has the patient been seen at the hospital within the last three years: Yes Total Score: 85 Level Of Care: New/Established - Level 3 Electronic Signature(s) Signed: 06/13/2021 1:00:34 PM By: Donnamarie Poag Entered By: Donnamarie Poag on 06/13/2021 09:04:07 [name] ([id number]) -------------------------------------------------------------------------------- Encounter Discharge Information Details Patient Name: [name] Date of Service: 06/13/2021 9:00 AM Medical Record Number: [id number] Patient Account Number: [id number] Date of Birth/Sex: 10-23-1941 (80 y.o. M) Treating RN: Donnamarie Poag Primary Care Dan Dissinger: London Pepper Other Clinician: Referring Kilynn Fitzsimmons: Dimas Chyle Treating Requan Hardge/Extender: Skipper Cliche in Treatment: 7 Encounter Discharge Information Items Discharge Condition: Stable Ambulatory Status:  Ambulatory Discharge Destination: Home Transportation: Private Auto Accompanied By: self Schedule Follow-up Appointment: Yes Clinical Summary of Care: Electronic Signature(s) Signed: 06/13/2021 1:00:34 PM By: Donnamarie Poag Entered By: Donnamarie Poag on 06/13/2021 09:00:45 [name] ([id number]) -------------------------------------------------------------------------------- Wound Assessment Details Patient Name: [name] Date of Service: 06/13/2021 9:00 AM Medical Record Number: [id number] Patient Account Number: [id number] Date of Birth/Sex: [birth year] (80 y.o. M) Treating RN: Donnamarie Poag Primary Care Rolonda Pontarelli: London Pepper Other Clinician: Referring Cipriana Biller: Dimas Chyle Treating Christalyn Goertz/Extender: Skipper Cliche in Treatment: 7 Wound Status Wound Number: 1 Primary Diabetic Wound/Ulcer of the Lower Extremity Etiology: Wound Location: Left, Medial Lower Leg Wound Status: Open Wounding Event: Trauma Comorbid Arrhythmia, Hypertension, Type II Diabetes, Received Date Acquired: 04/02/2021 History: Radiation Weeks Of Treatment: 7 Clustered Wound: No Wound Measurements Length: (cm) 3.7 Width: (cm) 1.9 Depth: (cm) 0.3 Area: (cm) 5.521 Volume: (cm) 1.656 % Reduction in Area: 66.5% % Reduction in Volume: 95.4% Tunneling: No Undermining: No Wound Description Classification: Grade 2 Exudate Amount: Medium Exudate Type: Serosanguineous Exudate Color: red, brown Wound Bed Granulation Amount: Large (67-100%) Exposed Structure Granulation Quality: Red, Hyper-granulation Fascia Exposed: No Necrotic Amount: None Present (0%) Fat Layer (Subcutaneous Tissue) Exposed: Yes Tendon Exposed: No Muscle Exposed: No Joint Exposed: No Bone Exposed: No Treatment Notes Wound #1 (Lower Leg) Wound Laterality: Left, Medial Cleanser Soap and Water Discharge Instruction: Gently cleanse wound with antibacterial soap, rinse and pat dry prior to dressing wounds Peri-Wound Care Desitin  Maximum Strength Ointment 4 (oz) Discharge Instruction: GENEROUSLY Apply periwound Topical Santyl Collagenase Ointment, 30 (gm), tube Discharge Instruction: Apply nickel thick to wound bed Primary Dressing Hydrofera Blue Ready Transfer Foam, 4x5 (in/in) Discharge Instruction: use the rope for the tunnel Apply Hydrofera Blue Ready to wound bed as directed Secondary Dressing ABD Pad 5x9 (in/in) Discharge Instruction: Cover with ABD pad Kerlix 4.5 x 4.1 (in/yd) [name], [name] ([id number]) Discharge Instruction: OVER ABD PAD, Apply Kerlix 4.5 x 4.1 (in/yd) as  instructed Secured With Coban Cohesive Bandage 4x5 (yds) Stretched Discharge Instruction: Apply Coban as directed AS TOP LAYER Compression Wrap Compression Stockings Add-Ons Electronic Signature(s) Signed: 06/13/2021 1:00:34 PM By: Donnamarie Poag Entered By: Donnamarie Poag on 06/13/2021 08:56:14 [name] ([id number]) -------------------------------------------------------------------------------- Wound Assessment Details Patient Name: [name] Date of Service: 06/13/2021 9:00 AM Medical Record Number: [id number] Patient Account Number: [id number] Date of Birth/Sex: 1941-09-06 (80 y.o. M) Treating RN: Donnamarie Poag Primary Care Romulo Okray: London Pepper Other Clinician: Referring Rayen Dafoe: Dimas Chyle Treating Deion Swift/Extender: Skipper Cliche in Treatment: 7 Wound Status Wound Number: 2 Primary Venous Leg Ulcer Etiology: Wound Location: Right, Medial Lower Leg Wound Status: Open Wounding Event: Blister Comorbid Arrhythmia, Hypertension, Type II Diabetes, Received Date Acquired: 05/25/2021 History: Radiation Weeks Of Treatment: 1 Clustered Wound: No Wound Measurements Length: (cm) 0.1 Width: (cm) 0.1 Depth: (cm) 0.1 Area: (cm) 0.008 Volume: (cm) 0.001 % Reduction in Area: 98.6% % Reduction in Volume: 98.3% Tunneling: No Undermining: No Wound Description Classification: Full Thickness Without Exposed Support  Structu Exudate Amount: Medium Exudate Type: Serosanguineous Exudate Color: red, brown res Foul Odor After Cleansing: No Slough/Fibrino No Wound Bed Granulation Amount: Large (67-100%) Granulation Quality: Red Necrotic Amount: None Present (0%) Treatment Notes Wound #2 (Lower Leg) Wound Laterality: Right, Medial Cleanser Soap and Water Discharge Instruction: Gently cleanse wound with antibacterial soap, rinse and pat dry prior to dressing wounds Peri-Wound Care Topical Bacitracin Ointment, 1 (oz) tube Discharge Instruction: appy to right leg wound Primary Dressing Secondary Dressing Coverlet Latex-Free Fabric Adhesive Dressings Discharge Instruction: bandaid or similar to keep wound covered Secured With Compression Wrap Compression Stockings Add-Ons Electronic Signature(s) Signed: 06/13/2021 1:00:34 PM By: Francia Greaves ([id number]) Entered ByDonnamarie Poag on 06/13/2021 08:56:33

## 2021-06-21 ENCOUNTER — Other Ambulatory Visit: Payer: Self-pay

## 2021-06-21 ENCOUNTER — Encounter (HOSPITAL_BASED_OUTPATIENT_CLINIC_OR_DEPARTMENT_OTHER): Payer: Medicare Other | Admitting: Internal Medicine

## 2021-06-21 DIAGNOSIS — E1122 Type 2 diabetes mellitus with diabetic chronic kidney disease: Secondary | ICD-10-CM | POA: Diagnosis not present

## 2021-06-21 DIAGNOSIS — L97822 Non-pressure chronic ulcer of other part of left lower leg with fat layer exposed: Secondary | ICD-10-CM

## 2021-06-21 DIAGNOSIS — S8012XD Contusion of left lower leg, subsequent encounter: Secondary | ICD-10-CM | POA: Diagnosis not present

## 2021-06-21 DIAGNOSIS — Z8616 Personal history of COVID-19: Secondary | ICD-10-CM | POA: Diagnosis not present

## 2021-06-21 DIAGNOSIS — S81801D Unspecified open wound, right lower leg, subsequent encounter: Secondary | ICD-10-CM | POA: Diagnosis not present

## 2021-06-21 DIAGNOSIS — E11622 Type 2 diabetes mellitus with other skin ulcer: Secondary | ICD-10-CM | POA: Diagnosis not present

## 2021-06-21 DIAGNOSIS — I129 Hypertensive chronic kidney disease with stage 1 through stage 4 chronic kidney disease, or unspecified chronic kidney disease: Secondary | ICD-10-CM | POA: Diagnosis not present

## 2021-06-21 DIAGNOSIS — N183 Chronic kidney disease, stage 3 unspecified: Secondary | ICD-10-CM | POA: Diagnosis not present

## 2021-06-21 DIAGNOSIS — I872 Venous insufficiency (chronic) (peripheral): Secondary | ICD-10-CM | POA: Diagnosis not present

## 2021-06-21 DIAGNOSIS — Z87891 Personal history of nicotine dependence: Secondary | ICD-10-CM | POA: Diagnosis not present
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
## 2021-06-21 NOTE — Progress Notes (Signed)
[name], [name] ([id number]) Visit Report for 06/21/2021 Arrival Information Details Patient Name: [name] Date of Service: 06/21/2021 11:00 AM Medical Record Number: [id number] Patient Account Number: [id number] Date of Birth/Sex: May 06, 1941 (80 y.o. M) Treating RN: Carlene Coria Primary Care Madasyn Heath: London Pepper Other Clinician: Referring Lucciano Vitali: Dimas Chyle Treating Anetra Czerwinski/Extender: Yaakov Guthrie in Treatment: 9 Visit Information History Since Last Visit All ordered tests and consults were completed: No Patient Arrived: Ambulatory Added or deleted any medications: No Arrival Time: 11:32 Any new allergies or adverse reactions: No Accompanied By: friend Had a fall or experienced change in No Transfer Assistance: None activities of daily living that may affect Patient Identification Verified: Yes risk of falls: Secondary Verification Process Completed: Yes Signs or symptoms of abuse/neglect since last visito No Patient Requires Transmission-Based No Hospitalized since last visit: No Precautions: Implantable device outside of the clinic excluding No Patient Has Alerts: Yes cellular tissue based products placed in the center Patient Alerts: Patient on Blood since last visit: Thinner Has Dressing in Place as Prescribed: Yes DIABETIC Has Compression in Place as Prescribed: Yes Pain Present Now: No Electronic Signature(s) Signed: 06/21/2021 4:08:48 PM By: Carlene Coria RN Entered By: Carlene Coria on 06/21/2021 11:40:29 [name] ([id number]) -------------------------------------------------------------------------------- Clinic Level of Care Assessment Details Patient Name: [name] Date of Service: 06/21/2021 11:00 AM Medical Record Number: [id number] Patient Account Number: [id number] Date of Birth/Sex: 12/28/1940 (80 y.o. M) Treating RN: Donnamarie Poag Primary Care Shann Lewellyn: London Pepper Other Clinician: Referring Gailene Youkhana: Dimas Chyle Treating  Keturah Yerby/Extender: Yaakov Guthrie in Treatment: 9 Clinic Level of Care Assessment Items TOOL 4 Quantity Score []  - Use when only an EandM is performed on FOLLOW-UP visit 0 ASSESSMENTS - Nursing Assessment / Reassessment []  - Reassessment of Co-morbidities (includes updates in patient status) 0 []  - 0 Reassessment of Adherence to Treatment Plan ASSESSMENTS - Wound and Skin Assessment / Reassessment X - Simple Wound Assessment / Reassessment - one wound 1 5 []  - 0 Complex Wound Assessment / Reassessment - multiple wounds []  - 0 Dermatologic / Skin Assessment (not related to wound area) ASSESSMENTS - Focused Assessment []  - Circumferential Edema Measurements - multi extremities 0 []  - 0 Nutritional Assessment / Counseling / Intervention []  - 0 Lower Extremity Assessment (monofilament, tuning fork, pulses) []  - 0 Peripheral Arterial Disease Assessment (using hand held doppler) ASSESSMENTS - Ostomy and/or Continence Assessment and Care []  - Incontinence Assessment and Management 0 []  - 0 Ostomy Care Assessment and Management (repouching, etc.) PROCESS - Coordination of Care X - Simple Patient / Family Education for ongoing care 1 15 []  - 0 Complex (extensive) Patient / Family Education for ongoing care X- 1 10 Staff obtains Programmer, systems, Records, Test Results / Process Orders []  - 0 Staff telephones HHA, Nursing Homes / Clarify orders / etc []  - 0 Routine Transfer to another Facility (non-emergent condition) []  - 0 Routine Hospital Admission (non-emergent condition) []  - 0 New Admissions / Biomedical engineer / Ordering NPWT, Apligraf, etc. []  - 0 Emergency Hospital Admission (emergent condition) X- 1 10 Simple Discharge Coordination []  - 0 Complex (extensive) Discharge Coordination PROCESS - Special [name] []  - Pediatric / Minor Patient Management 0 []  - 0 Isolation Patient Management []  - 0 Hearing / Language / Visual special [name] []  - 0 Assessment of  Community assistance (transportation, D/C planning, etc.) []  - 0 Additional assistance / Altered mentation []  - 0 Support Surface(s) Assessment (bed, cushion, seat, etc.) INTERVENTIONS - Wound Cleansing / Measurement [name] ([id number])  X- 1 5 Simple Wound Cleansing - one wound []  - 0 Complex Wound Cleansing - multiple wounds X- 1 5 Wound Imaging (photographs - any number of wounds) []  - 0 Wound Tracing (instead of photographs) X- 1 5 Simple Wound Measurement - one wound []  - 0 Complex Wound Measurement - multiple wounds INTERVENTIONS - Wound Dressings []  - Small Wound Dressing one or multiple wounds 0 X- 1 15 Medium Wound Dressing one or multiple wounds []  - 0 Large Wound Dressing one or multiple wounds X- 1 5 Application of Medications - topical []  - 0 Application of Medications - injection INTERVENTIONS - Miscellaneous []  - External ear exam 0 []  - 0 Specimen Collection (cultures, biopsies, blood, body fluids, etc.) []  - 0 Specimen(s) / Culture(s) sent or taken to Lab for analysis []  - 0 Patient Transfer (multiple staff / Civil Service fast streamer / Similar devices) []  - 0 Simple Staple / Suture removal (25 or less) []  - 0 Complex Staple / Suture removal (26 or more) []  - 0 Hypo / Hyperglycemic Management (close monitor of Blood Glucose) []  - 0 Ankle / Brachial Index (ABI) - do not check if billed separately X- 1 5 Vital Signs Has the patient been seen at the hospital within the last three years: Yes Total Score: 80 Level Of Care: New/Established - Level 3 Electronic Signature(s) Signed: 06/21/2021 3:20:19 PM By: Donnamarie Poag Entered By: Donnamarie Poag on 06/21/2021 11:53:06 [name] ([id number]) -------------------------------------------------------------------------------- Encounter Discharge Information Details Patient Name: [name] Date of Service: 06/21/2021 11:00 AM Medical Record Number: [id number] Patient Account Number: [id number] Date of  Birth/Sex: 05/31/1941 (80 y.o. M) Treating RN: Donnamarie Poag Primary Care Shazia Mitchener: London Pepper Other Clinician: Referring Ryleeann Urquiza: Dimas Chyle Treating Mariaeduarda Defranco/Extender: Yaakov Guthrie in Treatment: 9 Encounter Discharge Information Items Discharge Condition: Stable Ambulatory Status: Ambulatory Discharge Destination: Home Transportation: Private Auto Accompanied By: girlfriend Schedule Follow-up Appointment: Yes Clinical Summary of Care: Electronic Signature(s) Signed: 06/21/2021 3:20:19 PM By: Donnamarie Poag Entered By: Donnamarie Poag on 06/21/2021 12:03:25 [name] ([id number]) -------------------------------------------------------------------------------- Lower Extremity Assessment Details Patient Name: [name] Date of Service: 06/21/2021 11:00 AM Medical Record Number: [id number] Patient Account Number: [id number] Date of Birth/Sex: 01/12/1941 (80 y.o. M) Treating RN: Carlene Coria Primary Care Catilyn Boggus: London Pepper Other Clinician: Referring Zaynah Chawla: Dimas Chyle Treating Shaida Route/Extender: Yaakov Guthrie in Treatment: 9 Edema Assessment Assessed: [Left: No] [Right: No] Edema: [Left: Ye] [Right: s] Calf Left: Right: Point of Measurement: 36 cm From Medial Instep 32 cm Ankle Left: Right: Point of Measurement: 9 cm From Medial Instep 22 cm Vascular Assessment Pulses: Dorsalis Pedis Palpable: [Left:Yes] Electronic Signature(s) Signed: 06/21/2021 4:08:48 PM By: Carlene Coria RN Entered By: Carlene Coria on 06/21/2021 11:43:20 [name] ([id number]) -------------------------------------------------------------------------------- Multi Wound Chart Details Patient Name: [name] Date of Service: 06/21/2021 11:00 AM Medical Record Number: [id number] Patient Account Number: [id number] Date of Birth/Sex: 06/12/1941 (80 y.o. 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 N/A Extremity Comorbid History: Arrhythmia, Hypertension, Type II Arrhythmia, Hypertension, Type II N/A Diabetes, Received Radiation Diabetes, Received Radiation Date Acquired: 04/02/2021 05/25/2021 N/A Weeks of Treatment: 9 3 N/A Wound Status:  Open Healed - Epithelialized N/A Measurements L x W x D (cm) 2.6x0.8x0.3 0x0x0 N/A Area (cm) : 1.634 0 N/A Volume (cm) : 0.49 0 N/A % Reduction in Area: 90.10% 100.00% N/A % Reduction in Volume: 98.60% 100.00% N/A Classification: Grade 2 Full Thickness Without Exposed N/A Support Structures Exudate Amount: Medium None Present N/A Exudate Type: Serosanguineous N/A N/A Exudate Color: red, brown N/A N/A Granulation Amount: Large (67-100%) None Present (0%) N/A Granulation Quality: Red, Hyper-granulation N/A N/A Necrotic Amount: None Present (0%) None Present (0%) N/A Exposed Structures: Fat Layer (Subcutaneous Tissue): N/A N/A Yes Fascia: No Tendon: No Muscle: No Joint: No Bone: No Epithelialization: Medium (34-66%) Large (67-100%) N/A Treatment Notes Electronic Signature(s) Signed: 06/21/2021 12:00:04 PM By: Kalman Shan DO Entered By: Kalman Shan on 06/21/2021 11:55:56 [name] ([id number]) -------------------------------------------------------------------------------- Multi-Disciplinary Care Plan Details Patient Name: [name] Date of Service: 06/21/2021 11:00 AM Medical Record Number: [id number] Patient Account Number: [id number] Date of Birth/Sex: Nov 08, 1940 (80 y.o. M) Treating  RN: Donnamarie Poag Primary Care Jarvis Sawa: London Pepper Other Clinician: Referring Danicia Terhaar: Dimas Chyle Treating Lakiah Dhingra/Extender: Yaakov Guthrie in Treatment: 9 Active Inactive Electronic Signature(s) Signed: 06/21/2021 3:20:19 PM By: Donnamarie Poag Entered By: Donnamarie Poag on 06/21/2021 11:51:11 [name] ([id number]) -------------------------------------------------------------------------------- Pain Assessment Details Patient Name: [name] Date of Service: 06/21/2021 11:00 AM Medical Record Number: [id number] Patient Account Number: [id number] Date of Birth/Sex: [birth year] (80 y.o. M) Treating RN: Carlene Coria Primary Care Leauna Sharber: London Pepper Other Clinician: Referring Evangelia Whitaker: Dimas Chyle Treating Lafern Brinkley/Extender: Yaakov Guthrie in Treatment: 9 Active Problems Location of Pain Severity and Description of Pain Patient Has Paino No Site Locations Pain Management and Medication Current Pain Management: Electronic Signature(s) Signed: 06/21/2021 4:08:48 PM By: Carlene Coria RN Entered By: Carlene Coria on 06/21/2021 11:40:52 [name] ([id number]) -------------------------------------------------------------------------------- Patient/Caregiver Education Details Patient Name: [name] Date of Service: 06/21/2021 11:00 AM Medical Record Number: [id number] Patient Account Number: [id number] Date of Birth/Gender: 04/14/1941 (80 y.o. M) Treating RN: Donnamarie Poag Primary Care Physician: London Pepper Other Clinician: Referring Physician: Dimas Chyle Treating Physician/Extender: Yaakov Guthrie in Treatment: 9 Education Assessment Education Provided To: Patient Education Topics Provided Basic Hygiene: Venous: Wound/Skin Impairment: Engineer, maintenance) Signed: 06/21/2021 3:20:19 PM By: Donnamarie Poag Entered By: Donnamarie Poag on 06/21/2021 11:53:23 [name]  ([id number]) -------------------------------------------------------------------------------- Wound Assessment Details Patient Name: [name] Date of Service: 06/21/2021 11:00 AM Medical Record Number: [id number] Patient Account Number: [id number] Date of Birth/Sex: Jul 12, 1941 (80 y.o. M) Treating RN: Carlene Coria Primary Care Luverne Zerkle: London Pepper Other Clinician: Referring Christoph Copelan: Dimas Chyle Treating Monai Hindes/Extender: Yaakov Guthrie in Treatment: 9 Wound Status Wound Number: 1 Primary Diabetic Wound/Ulcer of the Lower Extremity Etiology: Wound Location: Left, Medial Lower Leg Wound Status: Open Wounding Event: Trauma Comorbid Arrhythmia, Hypertension, Type II Diabetes, Received Date Acquired: 04/02/2021 History: Radiation Weeks Of Treatment: 9 Clustered Wound: No Photos Wound Measurements Length: (cm) 2.6 Width: (cm) 0.8 Depth: (cm) 0.3 Area: (cm) 1.634 Volume: (cm) 0.49 % Reduction in Area: 90.1% % Reduction in Volume: 98.6% Epithelialization: Medium (34-66%) Tunneling: No Undermining: No Wound Description Classification: Grade 2 Exudate Amount: Medium Exudate Type: Serosanguineous Exudate Color: red, brown Foul Odor After Cleansing: No Slough/Fibrino Yes Wound Bed Granulation Amount: Large (67-100%) Exposed Structure Granulation Quality: Red, Hyper-granulation Fascia Exposed: No Necrotic Amount: None Present (0%) Fat Layer (Subcutaneous Tissue) Exposed: Yes Tendon Exposed: No Muscle Exposed: No Joint Exposed: No Bone Exposed: No Treatment Notes Wound #1 (Lower Leg) Wound Laterality: Left, Medial Cleanser Soap and Water Discharge  Instruction: Gently cleanse wound with antibacterial soap, rinse and pat dry prior to dressing wounds Peri-Wound Care Desitin Maximum Strength Ointment 4 (oz) KYNGSTON, PICKELSIMER ([id number]) Discharge Instruction: GENEROUSLY Apply periwound Topical Santyl Collagenase Ointment, 30 (gm), tube Discharge  Instruction: Apply nickel thick to wound bed Primary Dressing Hydrofera Blue Ready Transfer Foam, 4x5 (in/in) Discharge Instruction: use the rope for the tunnel Apply Hydrofera Blue Ready to wound bed as directed Secondary Dressing ABD Pad 5x9 (in/in) Discharge Instruction: Cover with ABD pad Kerlix 4.5 x 4.1 (in/yd) Discharge Instruction: OVER ABD PAD, Apply Kerlix 4.5 x 4.1 (in/yd) as instructed Secured With Coban Cohesive Bandage 4x5 (yds) Stretched Discharge Instruction: Apply Coban as directed AS TOP LAYER Compression Wrap Compression Stockings Add-Ons Electronic Signature(s) Signed: 06/21/2021 4:08:48 PM By: Carlene Coria RN Entered By: Carlene Coria on 06/21/2021 11:41:49 [name] ([id number]) -------------------------------------------------------------------------------- Wound Assessment Details Patient Name: [name] Date of Service: 06/21/2021 11:00 AM Medical Record Number: [id number] Patient Account Number: [id number] Date of Birth/Sex: 1941/10/25 (80 y.o. M) Treating RN: Carlene Coria Primary Care Aiyannah Fayad: London Pepper Other Clinician: Referring Jadin Kagel: Dimas Chyle Treating Lyniah Fujita/Extender: Yaakov Guthrie in Treatment: 9 Wound Status Wound Number: 2 Primary Venous Leg Ulcer Etiology: Wound Location: Right, Medial Lower Leg Wound Status: Healed - Epithelialized Wounding Event: Blister Comorbid Arrhythmia, Hypertension, Type II Diabetes, Received Date Acquired: 05/25/2021 History: Radiation Weeks Of Treatment: 3 Clustered Wound: No Photos Wound Measurements Length: (cm) 0 Width: (cm) 0 Depth: (cm) 0 Area: (cm) 0 Volume: (cm) 0 % Reduction in Area: 100% % Reduction in Volume: 100% Epithelialization: Large (67-100%) Tunneling: No Undermining: No Wound Description Classification: Full Thickness Without Exposed Support Structure Exudate Amount: None Present s Foul Odor After Cleansing: No Slough/Fibrino No Wound Bed Granulation  Amount: None Present (0%) Necrotic Amount: None Present (0%) Treatment Notes Wound #2 (Lower Leg) Wound Laterality: Right, Medial Cleanser Peri-Wound Care Topical Primary Dressing Secondary Dressing Secured With Compression KAMARRION, STFORT ([id number]) Compression Stockings Add-Ons Electronic Signature(s) Signed: 06/21/2021 4:08:48 PM By: Carlene Coria RN Entered By: Carlene Coria on 06/21/2021 11:42:27 [name] ([id number]) -------------------------------------------------------------------------------- Vitals Details Patient Name: [name] Date of Service: 06/21/2021 11:00 AM Medical Record Number: [id number] Patient Account Number: [id number] Date of Birth/Sex: December 18, 1940 (80 y.o. M) Treating RN: Carlene Coria Primary Care Brayn Eckstein: London Pepper Other Clinician: Referring Ivylynn Hoppes: Dimas Chyle Treating Chyanna Flock/Extender: Yaakov Guthrie in Treatment: 9 Vital Signs Time Taken: 11:40 Temperature (F): 98.2 Height (in): 68 Pulse (bpm): 75 Weight (lbs): 180 Respiratory Rate (breaths/min): 18 Body Mass Index (BMI): 27.4 Blood Pressure (mmHg): 124/73 Reference Range: 80 - 120 mg / dl Electronic Signature(s) Signed: 06/21/2021 4:08:48 PM By: Carlene Coria RN Entered By: Carlene Coria on 06/21/2021 11:40:45

## 2021-06-28 ENCOUNTER — Encounter (HOSPITAL_BASED_OUTPATIENT_CLINIC_OR_DEPARTMENT_OTHER): Payer: Medicare Other | Admitting: Internal Medicine

## 2021-06-28 ENCOUNTER — Other Ambulatory Visit: Payer: Self-pay

## 2021-06-28 DIAGNOSIS — E11622 Type 2 diabetes mellitus with other skin ulcer: Secondary | ICD-10-CM | POA: Diagnosis not present

## 2021-06-28 DIAGNOSIS — S81801D Unspecified open wound, right lower leg, subsequent encounter: Secondary | ICD-10-CM | POA: Diagnosis not present

## 2021-06-28 DIAGNOSIS — L97822 Non-pressure chronic ulcer of other part of left lower leg with fat layer exposed: Secondary | ICD-10-CM

## 2021-06-28 DIAGNOSIS — I129 Hypertensive chronic kidney disease with stage 1 through stage 4 chronic kidney disease, or unspecified chronic kidney disease: Secondary | ICD-10-CM | POA: Diagnosis not present

## 2021-06-28 DIAGNOSIS — S8012XD Contusion of left lower leg, subsequent encounter: Secondary | ICD-10-CM | POA: Diagnosis not present

## 2021-06-28 DIAGNOSIS — I872 Venous insufficiency (chronic) (peripheral): Secondary | ICD-10-CM

## 2021-06-28 DIAGNOSIS — Z87891 Personal history of nicotine dependence: Secondary | ICD-10-CM | POA: Diagnosis not present

## 2021-06-28 DIAGNOSIS — Z8616 Personal history of COVID-19: Secondary | ICD-10-CM | POA: Diagnosis not present

## 2021-06-28 DIAGNOSIS — N183 Chronic kidney disease, stage 3 unspecified: Secondary | ICD-10-CM | POA: Diagnosis not present

## 2021-06-28 DIAGNOSIS — E1122 Type 2 diabetes mellitus with diabetic chronic kidney disease: Secondary | ICD-10-CM | POA: Diagnosis not present
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
## 2021-07-03 NOTE — Progress Notes (Signed)
CIEL, YANES ([id number]) Visit Report for 06/28/2021 Arrival Information Details Patient Name: [name], [name] Date of Service: 06/28/2021 11:00 AM Medical Record Number: [id number] Patient Account Number: [id number] Date of Birth/Sex: Feb 26, 1941 (80 y.o. M) Treating RN: Carlene Coria Primary Care Hira Trent: London Pepper Other Clinician: Referring Roshini Fulwider: Dimas Chyle Treating Loranzo Desha/Extender: Yaakov Guthrie in Treatment: 10 Visit Information History Since Last Visit All ordered tests and consults were completed: No Patient Arrived: Ambulatory Added or deleted any medications: No Arrival Time: 11:06 Any new allergies or adverse reactions: No Accompanied By: friend Had a fall or experienced change in No Transfer Assistance: None activities of daily living that may affect Patient Identification Verified: Yes risk of falls: Secondary Verification Process Completed: Yes Signs or symptoms of abuse/neglect since last visito No Patient Requires Transmission-Based No Hospitalized since last visit: No Precautions: Implantable device outside of the clinic excluding No Patient Has Alerts: Yes cellular tissue based products placed in the center Patient Alerts: Patient on Blood since last visit: Thinner Has Dressing in Place as Prescribed: Yes DIABETIC Has Compression in Place as Prescribed: Yes Pain Present Now: No Electronic Signature(s) Signed: 07/03/2021 1:47:40 PM By: Carlene Coria RN Entered By: Carlene Coria on 06/28/2021 11:09:20 [name] ([id number]) -------------------------------------------------------------------------------- Clinic Level of Care Assessment Details Patient Name: [name] Date of Service: 06/28/2021 11:00 AM Medical Record Number: [id number] Patient Account Number: [id number] Date of Birth/Sex: [birth year] (80 y.o. M) Treating RN: Carlene Coria Primary Care Yuvan Medinger: London Pepper Other Clinician: Referring Yandiel Bergum: Dimas Chyle Treating Zsofia Prout/Extender: Yaakov Guthrie in Treatment: 10 Clinic Level of Care Assessment Items TOOL 1 Quantity Score []  - Use when EandM and Procedure is performed on INITIAL visit 0 ASSESSMENTS - Nursing Assessment / Reassessment []  - General Physical Exam (combine w/ comprehensive assessment (listed just below) when performed on new 0 pt. evals) []  - 0 Comprehensive Assessment (HX, ROS, Risk Assessments, Wounds Hx, etc.) ASSESSMENTS - Wound and Skin Assessment / Reassessment []  - Dermatologic / Skin Assessment (not related to wound area) 0 ASSESSMENTS - Ostomy and/or Continence Assessment and Care []  - Incontinence Assessment and Management 0 []  - 0 Ostomy Care Assessment and Management (repouching, etc.) PROCESS - Coordination of Care []  - Simple Patient / Family Education for ongoing care 0 []  - 0 Complex (extensive) Patient / Family Education for ongoing care []  - 0 Staff obtains Programmer, systems, Records, Test Results / Process Orders []  - 0 Staff telephones HHA, Nursing Homes / Clarify orders / etc []  - 0 Routine Transfer to another Facility (non-emergent condition) []  - 0 Routine Hospital Admission (non-emergent condition) []  - 0 New Admissions / Biomedical engineer / Ordering NPWT, Apligraf, etc. []  - 0 Emergency Hospital Admission (emergent condition) PROCESS - Special [name] []  - Pediatric / Minor Patient Management 0 []  - 0 Isolation Patient Management []  - 0 Hearing / Language / Visual special [name] []  - 0 Assessment of Community assistance (transportation, D/C planning, etc.) []  - 0 Additional assistance / Altered mentation []  - 0 Support Surface(s) Assessment (bed, cushion, seat, etc.) INTERVENTIONS - Miscellaneous []  - External ear exam 0 []  - 0 Patient Transfer (multiple staff / Civil Service fast streamer / Similar devices) []  - 0 Simple Staple / Suture removal (25 or less) []  - 0 Complex Staple / Suture removal (26 or more) []  -  0 Hypo/Hyperglycemic Management (do not check if billed separately) []  - 0 Ankle / Brachial Index (ABI) - do not check if billed separately Has the patient been seen at the hospital  within the last three years: Yes Total Score: 0 Level Of Care: ____ [name] ([id number]) Electronic Signature(s) Signed: 07/03/2021 1:47:40 PM By: Carlene Coria RN Entered By: Carlene Coria on 06/28/2021 11:25:45 [name] ([id number]) -------------------------------------------------------------------------------- Compression Therapy Details Patient Name: [name] Date of Service: 06/28/2021 11:00 AM Medical Record Number: [id number] Patient Account Number: [id number] Date of Birth/Sex: 09-23-1941 (80 y.o. M) Treating RN: Carlene Coria Primary Care Daliyah Sramek: London Pepper Other Clinician: Referring Audreyana Huntsberry: Dimas Chyle Treating Taneil Lazarus/Extender: Yaakov Guthrie in Treatment: 10 Compression Therapy Performed for Wound Assessment: Wound #1 Left,Medial Lower Leg Performed By: Clinician Carlene Coria, RN Compression Type: Double Layer Post Procedure Diagnosis Same as Pre-procedure Electronic Signature(s) Signed: 07/03/2021 1:47:40 PM By: Carlene Coria RN Entered By: Carlene Coria on 06/28/2021 11:24:41 [name] ([id number]) -------------------------------------------------------------------------------- Encounter Discharge Information Details Patient Name: [name] Date of Service: 06/28/2021 11:00 AM Medical Record Number: [id number] Patient Account Number: [id number] Date of Birth/Sex: 05-25-1941 (80 y.o. M) Treating RN: Carlene Coria Primary Care Zohar Maroney: London Pepper Other Clinician: Referring Darienne Belleau: Dimas Chyle Treating Kaceton Vieau/Extender: Yaakov Guthrie in Treatment: 10 Encounter Discharge Information Items Discharge Condition: Stable Ambulatory Status: Ambulatory Discharge Destination: Home Transportation: Private Auto Accompanied By:  friend Schedule Follow-up Appointment: Yes Clinical Summary of Care: Patient Declined Electronic Signature(s) Signed: 07/03/2021 1:47:40 PM By: Carlene Coria RN Entered By: Carlene Coria on 06/28/2021 11:32:19 [name] ([id number]) -------------------------------------------------------------------------------- Lower Extremity Assessment Details Patient Name: [name] Date of Service: 06/28/2021 11:00 AM Medical Record Number: [id number] Patient Account Number: [id number] Date of Birth/Sex: 03-31-1941 (80 y.o. M) Treating RN: Carlene Coria Primary Care Numan Zylstra: London Pepper Other Clinician: Referring Tibor Lemmons: Dimas Chyle Treating Shaquan Missey/Extender: Yaakov Guthrie in Treatment: 10 Edema Assessment Assessed: [Left: No] [Right: No] Edema: [Left: Ye] [Right: s] Calf Left: Right: Point of Measurement: 36 cm From Medial Instep 32 cm Ankle Left: Right: Point of Measurement: 9 cm From Medial Instep 22 cm Vascular Assessment Pulses: Dorsalis Pedis Palpable: [Left:Yes] Electronic Signature(s) Signed: 07/03/2021 1:47:40 PM By: Carlene Coria RN Entered By: Carlene Coria on 06/28/2021 11:19:39 [name] ([id number]) -------------------------------------------------------------------------------- Multi Wound Chart Details Patient Name: [name] Date of Service: 06/28/2021 11:00 AM Medical Record Number: [id number] Patient Account Number: [id number] Date of Birth/Sex: 1941-08-30 (80 y.o. 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 N/A Extremity Comorbid History: Arrhythmia, Hypertension, Type II Arrhythmia, Hypertension, Type II N/A Diabetes, Received Radiation Diabetes, Received Radiation Date Acquired: 04/02/2021 05/25/2021 N/A Weeks of Treatment: 10 4 N/A Wound Status: Open Open N/A Wound Recurrence: No Yes N/A Measurements L x W x D (cm) 3x1.5x0.1 1x1x0.1 N/A Area (cm) : 3.534 0.785 N/A Volume (cm) : 0.353 0.079 N/A % Reduction in Area: 78.60% -33.30% N/A % Reduction in Volume: 99.00% -33.90% N/A Classification: Grade 2 Full Thickness Without Exposed N/A Support Structures Exudate Amount: Medium None Present N/A Exudate Type: Serosanguineous N/A N/A Exudate Color: red, brown N/A N/A Granulation Amount: Large (67-100%) Large (67-100%) N/A Granulation Quality: Red, Hyper-granulation Red N/A Necrotic Amount: None Present (0%) None Present (0%) N/A Exposed Structures: Fat Layer (Subcutaneous Tissue): Fascia: No N/A Yes Fat Layer (Subcutaneous Tissue): Fascia:  No No Tendon: No Tendon: No Muscle: No Muscle: No Joint: No Joint: No Bone: No Bone: No Epithelialization: Medium (34-66%) None N/A Procedures Performed: Compression Therapy N/A N/A Treatment Notes Wound #1 (Lower Leg) Wound Laterality: Left, Medial Cleanser Soap and Water Discharge Instruction: Gently cleanse wound with antibacterial soap, rinse and pat dry prior to dressing wounds MILEY, LINDON ([id number]) Peri-Wound Care Desitin Maximum Strength Ointment 4 (oz) Discharge Instruction: GENEROUSLY Apply periwound Topical Primary Dressing Hydrofera Blue Ready Transfer Foam, 4x5 (in/in) Discharge Instruction: use the rope for the tunnel Apply Hydrofera Blue Ready to wound bed as directed Secondary Dressing ABD Pad 5x9 (in/in) Discharge Instruction: Cover with ABD pad Kerlix 4.5 x 4.1 (in/yd) Discharge Instruction: OVER ABD PAD, Apply Kerlix 4.5 x  4.1 (in/yd) as instructed Secured With Coban Cohesive Bandage 4x5 (yds) Stretched Discharge Instruction: Apply Coban as directed AS TOP LAYER Compression Wrap Compression Stockings Add-Ons Wound #2R (Lower Leg) Wound Laterality: Right, Medial Cleanser Soap and Water Discharge Instruction: Gently cleanse wound with antibacterial soap, rinse and pat dry prior to dressing wounds Peri-Wound Care Topical Triple Antibiotic Ointment, 1 (oz) Tube Primary Dressing Secondary Dressing Zetuvit Plus Silicone Border Dressing 4x4 (in/in) Secured With Compression Wrap Compression Stockings Add-Ons Electronic Signature(s) Signed: 06/28/2021 11:46:35 AM By: Kalman Shan DO Entered By: Kalman Shan on 06/28/2021 11:40:56 [name] ([id number]) -------------------------------------------------------------------------------- [place] Details Patient Name: [name] Date of Service: 06/28/2021 11:00 AM Medical Record Number: [id number] Patient Account Number: [id number] Date of Birth/Sex: 1941-05-12 (80 y.o. M) Treating RN: Carlene Coria Primary Care Elida Harbin: London Pepper Other Clinician: Referring Shiloh Southern: Dimas Chyle Treating Crissie Aloi/Extender: Yaakov Guthrie in Treatment: 10 Active Inactive Electronic Signature(s) Signed: 07/03/2021 1:47:40 PM By: Carlene Coria RN Entered By: Carlene Coria on 06/28/2021 11:24:07 [name] ([id number]) -------------------------------------------------------------------------------- Pain Assessment Details Patient Name: [name] Date of Service: 06/28/2021 11:00 AM Medical Record Number: [id number] Patient Account Number: [id number] Date of Birth/Sex: 03-05-1941 (80 y.o. M) Treating RN: Carlene Coria Primary Care Olivine Hiers: London Pepper Other Clinician: Referring Alka Falwell: Dimas Chyle Treating Asim Gersten/Extender: Yaakov Guthrie in Treatment: 10 Active Problems Location of Pain Severity and  Description of Pain Patient Has Paino No Site Locations Pain Management and Medication Current Pain Management: Electronic Signature(s) Signed: 07/03/2021 1:47:40 PM By: Carlene Coria RN Entered By: Carlene Coria on 06/28/2021 11:10:00 [name] ([id number]) -------------------------------------------------------------------------------- Patient/Caregiver Education Details Patient Name: [name] Date of Service: 06/28/2021 11:00 AM Medical Record Number: [id number] Patient Account Number: [id number] Date of Birth/Gender: October 12, 1941 (80 y.o. M) Treating RN: Carlene Coria Primary Care Physician: London Pepper Other Clinician: Referring Physician: Dimas Chyle Treating Physician/Extender: Yaakov Guthrie in Treatment: 10 Education Assessment Education Provided To: Patient Education Topics Provided Wound/Skin Impairment: Methods: Explain/Verbal Responses: State content correctly Electronic Signature(s) Signed: 07/03/2021 1:47:40 PM By: Carlene Coria RN Entered By: Carlene Coria on 06/28/2021 11:29:39 [name] ([id number]) -------------------------------------------------------------------------------- Wound Assessment Details Patient Name: [name] Date of Service: 06/28/2021 11:00 AM Medical Record Number: [id number] Patient Account Number: [id number] Date of Birth/Sex: 1941/04/28 (80 y.o. M) Treating RN: Carlene Coria Primary Care Keasha Malkiewicz: London Pepper Other Clinician: Referring Herminia Warren: Dimas Chyle Treating Hazael Olveda/Extender: Yaakov Guthrie in Treatment: 10 Wound Status Wound Number: 1 Primary Diabetic Wound/Ulcer of the Lower Extremity Etiology: Wound Location: Left, Medial Lower Leg Wound Status: Open Wounding Event: Trauma Comorbid Arrhythmia, Hypertension, Type II Diabetes, Received Date Acquired: 04/02/2021 History: Radiation Weeks Of Treatment: 10 Clustered Wound: No Photos Wound Measurements Length: (cm) 3 Width: (cm)  1.5 Depth: (cm) 0.1 Area: (  cm) 3.534 Volume: (cm) 0.353 % Reduction in Area: 78.6% % Reduction in Volume: 99% Epithelialization: Medium (34-66%) Tunneling: No Undermining: No Wound Description Classification: Grade 2 Exudate Amount: Medium Exudate Type: Serosanguineous Exudate Color: red, brown Foul Odor After Cleansing: No Slough/Fibrino Yes Wound Bed Granulation Amount: Large (67-100%) Exposed Structure Granulation Quality: Red, Hyper-granulation Fascia Exposed: No Necrotic Amount: None Present (0%) Fat Layer (Subcutaneous Tissue) Exposed: Yes Tendon Exposed: No Muscle Exposed: No Joint Exposed: No Bone Exposed: No Treatment Notes Wound #1 (Lower Leg) Wound Laterality: Left, Medial Cleanser Soap and Water Discharge Instruction: Gently cleanse wound with antibacterial soap, rinse and pat dry prior to dressing wounds Peri-Wound Care Desitin Maximum Strength Ointment 4 (oz) KAIMANA, LURZ ([id number]) Discharge Instruction: GENEROUSLY Apply periwound Topical Primary Dressing Hydrofera Blue Ready Transfer Foam, 4x5 (in/in) Discharge Instruction: use the rope for the tunnel Apply Hydrofera Blue Ready to wound bed as directed Secondary Dressing ABD Pad 5x9 (in/in) Discharge Instruction: Cover with ABD pad Kerlix 4.5 x 4.1 (in/yd) Discharge Instruction: OVER ABD PAD, Apply Kerlix 4.5 x 4.1 (in/yd) as instructed Secured With Coban Cohesive Bandage 4x5 (yds) Stretched Discharge Instruction: Apply Coban as directed AS TOP LAYER Compression Wrap Compression Stockings Add-Ons Electronic Signature(s) Signed: 07/03/2021 1:47:40 PM By: Carlene Coria RN Entered By: Carlene Coria on 06/28/2021 11:18:52 [name] ([id number]) -------------------------------------------------------------------------------- Wound Assessment Details Patient Name: [name] Date of Service: 06/28/2021 11:00 AM Medical Record Number: [id number] Patient Account Number: [id number] Date of  Birth/Sex: [birth year] (80 y.o. 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 Etiology: Wound Location: Right, Medial Lower Leg Wound Status: Open Wounding Event: Blister Comorbid Arrhythmia, Hypertension, Type II Diabetes, Received Date Acquired: 05/25/2021 History: Radiation Weeks Of Treatment: 4 Clustered Wound: No Wound Measurements Length: (cm) 1 Width: (cm) 1 Depth: (cm) 0.1 Area: (cm) 0.785 Volume: (cm) 0.079 % Reduction in Area: -33.3% % Reduction in Volume: -33.9% Epithelialization: None Tunneling: No Undermining: No Wound Description Classification: Full Thickness Without Exposed Support Structures Exudate Amount: None Present Foul Odor After Cleansing: No Slough/Fibrino No Wound Bed Granulation Amount: Large (67-100%) Exposed Structure Granulation Quality: Red Fascia Exposed: No Necrotic Amount: None Present (0%) Fat Layer (Subcutaneous Tissue) Exposed: No Tendon Exposed: No Muscle Exposed: No Joint Exposed: No Bone Exposed: No Treatment Notes Wound #2R (Lower Leg) Wound Laterality: Right, Medial Cleanser Soap and Water Discharge Instruction: Gently cleanse wound with antibacterial soap, rinse and pat dry prior to dressing wounds Peri-Wound Care Topical Triple Antibiotic Ointment, 1 (oz) Tube Primary Dressing Secondary Dressing Zetuvit Plus Silicone Border Dressing 4x4 (in/in) Secured With Compression Wrap Compression Stockings Add-Ons Electronic Signature(s) Signed: 07/03/2021 1:47:40 PM By: Carlene Coria RN Riki Sheer, Jenny Reichmann ([id number]) Entered By: Carlene Coria on 06/28/2021 11:28:53 [name] ([id number]) -------------------------------------------------------------------------------- Vitals Details Patient Name: [name] Date of Service:  06/28/2021 11:00 AM Medical Record Number: [id number] Patient Account Number: [id number] Date of Birth/Sex: 1941-10-22 (80 y.o. M) Treating RN: Carlene Coria Primary Care Tyarra Nolton: London Pepper Other Clinician: Referring Jourdon Zimmerle: Dimas Chyle Treating Denzil Bristol/Extender: Yaakov Guthrie in Treatment: 10 Vital Signs Time Taken: 11:09 Temperature (F): 98.3 Height (in): 68 Pulse (bpm): 71 Weight (lbs): 180 Respiratory Rate (breaths/min): 18 Body Mass Index (BMI): 27.4 Blood Pressure (mmHg): 116/64 Reference Range: 80 - 120 mg / dl Electronic Signature(s) Signed: 07/03/2021 1:47:40 PM By: Carlene Coria RN Entered By: Carlene Coria on 06/28/2021 11:09:46

## 2021-07-05 ENCOUNTER — Other Ambulatory Visit: Payer: Self-pay

## 2021-07-05 ENCOUNTER — Encounter (HOSPITAL_BASED_OUTPATIENT_CLINIC_OR_DEPARTMENT_OTHER): Payer: Medicare Other | Admitting: Internal Medicine

## 2021-07-05 DIAGNOSIS — E11622 Type 2 diabetes mellitus with other skin ulcer: Secondary | ICD-10-CM | POA: Diagnosis not present

## 2021-07-05 DIAGNOSIS — I872 Venous insufficiency (chronic) (peripheral): Secondary | ICD-10-CM

## 2021-07-05 DIAGNOSIS — N183 Chronic kidney disease, stage 3 unspecified: Secondary | ICD-10-CM | POA: Diagnosis not present

## 2021-07-05 DIAGNOSIS — S8012XD Contusion of left lower leg, subsequent encounter: Secondary | ICD-10-CM

## 2021-07-05 DIAGNOSIS — S81801D Unspecified open wound, right lower leg, subsequent encounter: Secondary | ICD-10-CM

## 2021-07-05 DIAGNOSIS — I129 Hypertensive chronic kidney disease with stage 1 through stage 4 chronic kidney disease, or unspecified chronic kidney disease: Secondary | ICD-10-CM | POA: Diagnosis not present

## 2021-07-05 DIAGNOSIS — Z87891 Personal history of nicotine dependence: Secondary | ICD-10-CM | POA: Diagnosis not present

## 2021-07-05 DIAGNOSIS — L97822 Non-pressure chronic ulcer of other part of left lower leg with fat layer exposed: Secondary | ICD-10-CM

## 2021-07-05 DIAGNOSIS — Z8616 Personal history of COVID-19: Secondary | ICD-10-CM | POA: Diagnosis not present

## 2021-07-05 DIAGNOSIS — E1122 Type 2 diabetes mellitus with diabetic chronic kidney disease: Secondary | ICD-10-CM | POA: Diagnosis not present
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
## 2021-07-05 NOTE — Progress Notes (Signed)
BINH, DOTEN ([id number]) Visit Report for 07/05/2021 Arrival Information Details Patient Name: [name], [name] Date of Service: 07/05/2021 11:00 AM Medical Record Number: [id number] Patient Account Number: [id number] Date of Birth/Sex: [birth year] (80 y.o. M) Treating RN: Dolan Amen Primary Care Marlyn Rabine: London Pepper Other Clinician: Referring Nohemy Koop: Dimas Chyle Treating Velena Keegan/Extender: Yaakov Guthrie in Treatment: 11 Visit Information History Since Last Visit Pain Present Now: No Patient Arrived: Ambulatory Arrival Time: 11:05 Accompanied By: self Transfer Assistance: None Patient Identification Verified: Yes Secondary Verification Process Completed: Yes Patient Requires Transmission-Based No Precautions: Patient Has Alerts: Yes Patient Alerts: Patient on Blood Thinner DIABETIC Electronic Signature(s) Signed: 07/05/2021 4:30:44 PM By: Dolan Amen RN Entered By: Dolan Amen on 07/05/2021 11:05:38 [name] ([id number]) -------------------------------------------------------------------------------- Clinic Level of Care Assessment Details Patient Name: [name] Date of Service: 07/05/2021 11:00 AM Medical Record Number: [id number] Patient Account Number: [id number] Date of Birth/Sex: 1941-06-09 (80 y.o. M) Treating RN: Dolan Amen Primary Care Janeli Lewison: London Pepper Other Clinician: Referring Coston Mandato: Dimas Chyle Treating Mj Willis/Extender: Yaakov Guthrie in Treatment: 11 Clinic Level of Care Assessment Items TOOL 4 Quantity Score X - Use when only an EandM is performed on FOLLOW-UP visit 1 0 ASSESSMENTS - Nursing Assessment / Reassessment X - Reassessment of Co-morbidities (includes updates in patient status) 1 10 X- 1 5 Reassessment of Adherence to Treatment Plan ASSESSMENTS - Wound and Skin Assessment / Reassessment X - Simple Wound Assessment / Reassessment - one wound 1 5 []  - 0 Complex Wound Assessment /  Reassessment - multiple wounds []  - 0 Dermatologic / Skin Assessment (not related to wound area) ASSESSMENTS - Focused Assessment []  - Circumferential Edema Measurements - multi extremities 0 []  - 0 Nutritional Assessment / Counseling / Intervention []  - 0 Lower Extremity Assessment (monofilament, tuning fork, pulses) []  - 0 Peripheral Arterial Disease Assessment (using hand held doppler) ASSESSMENTS - Ostomy and/or Continence Assessment and Care []  - Incontinence Assessment and Management 0 []  - 0 Ostomy Care Assessment and Management (repouching, etc.) PROCESS - Coordination of Care X - Simple Patient / Family Education for ongoing care 1 15 []  - 0 Complex (extensive) Patient / Family Education for ongoing care []  - 0 Staff obtains Programmer, systems, Records, Test Results / Process Orders []  - 0 Staff telephones HHA, Nursing Homes / Clarify orders / etc []  - 0 Routine Transfer to another Facility (non-emergent condition) []  - 0 Routine Hospital Admission (non-emergent condition) []  - 0 New Admissions / Biomedical engineer / Ordering NPWT, Apligraf, etc. []  - 0 Emergency Hospital Admission (emergent condition) X- 1 10 Simple Discharge Coordination []  - 0 Complex (extensive) Discharge Coordination PROCESS - Special [name] []  - Pediatric / Minor Patient Management 0 []  - 0 Isolation Patient Management []  - 0 Hearing / Language / Visual special [name] []  - 0 Assessment of Community assistance (transportation, D/C planning, etc.) []  - 0 Additional assistance / Altered mentation []  - 0 Support Surface(s) Assessment (bed, cushion, seat, etc.) INTERVENTIONS - Wound Cleansing / Measurement [name], [name] ([id number]) X- 1 5 Simple Wound Cleansing - one wound []  - 0 Complex Wound Cleansing - multiple wounds X- 1 5 Wound Imaging (photographs - any number of wounds) []  - 0 Wound Tracing (instead of photographs) X- 1 5 Simple Wound Measurement - one wound []  - 0 Complex  Wound Measurement - multiple wounds INTERVENTIONS - Wound Dressings []  - Small Wound Dressing one or multiple wounds 0 X- 1 15 Medium Wound Dressing one or multiple wounds []  - 0  Large Wound Dressing one or multiple wounds []  - 0 Application of Medications - topical []  - 0 Application of Medications - injection INTERVENTIONS - Miscellaneous []  - External ear exam 0 []  - 0 Specimen Collection (cultures, biopsies, blood, body fluids, etc.) []  - 0 Specimen(s) / Culture(s) sent or taken to Lab for analysis []  - 0 Patient Transfer (multiple staff / Harrel Lemon Lift / Similar devices) []  - 0 Simple Staple / Suture removal (25 or less) []  - 0 Complex Staple / Suture removal (26 or more) []  - 0 Hypo / Hyperglycemic Management (close monitor of Blood Glucose) []  - 0 Ankle / Brachial Index (ABI) - do not check if billed separately X- 1 5 Vital Signs Has the patient been seen at the hospital within the last three years: Yes Total Score: 80 Level Of Care: New/Established - Level 3 Electronic Signature(s) Signed: 07/05/2021 4:30:44 PM By: Dolan Amen RN Entered By: Dolan Amen on 07/05/2021 12:02:13 [name] ([id number]) -------------------------------------------------------------------------------- Encounter Discharge Information Details Patient Name: [name] Date of Service: 07/05/2021 11:00 AM Medical Record Number: [id number] Patient Account Number: [id number] Date of Birth/Sex: 14-Jan-1941 (80 y.o. M) Treating RN: Dolan Amen Primary Care Marylee Belzer: London Pepper Other Clinician: Referring Randal Goens: Dimas Chyle Treating Shanin Szymanowski/Extender: Yaakov Guthrie in Treatment: 11 Encounter Discharge Information Items Discharge Condition: Stable Ambulatory Status: Ambulatory Discharge Destination: Home Transportation: Private Auto Accompanied By: self Schedule Follow-up Appointment: Yes Clinical Summary of Care: Electronic Signature(s) Signed: 07/05/2021  4:30:44 PM By: Dolan Amen RN Entered By: Dolan Amen on 07/05/2021 12:02:53 [name] ([id number]) -------------------------------------------------------------------------------- Lower Extremity Assessment Details Patient Name: [name] Date of Service: 07/05/2021 11:00 AM Medical Record Number: [id number] Patient Account Number: [id number] Date of Birth/Sex: Jul 04, 1941 (80 y.o. M) Treating RN: Dolan Amen Primary Care Maryclare Nydam: London Pepper Other Clinician: Referring Edahi Kroening: Dimas Chyle Treating Harvel Meskill/Extender: Yaakov Guthrie in Treatment: 11 Edema Assessment Assessed: [Left: Yes] [Right: No] Edema: [Left: N] [Right: o] Calf Left: Right: Point of Measurement: 31 cm From Medial Instep 33 cm Ankle Left: Right: Point of Measurement: 9 cm From Medial Instep 22.2 cm Vascular Assessment Pulses: Dorsalis Pedis Palpable: [Left:Yes] Electronic Signature(s) Signed: 07/05/2021 4:30:44 PM By: Dolan Amen RN Entered By: Dolan Amen on 07/05/2021 11:19:16 [name] ([id number]) -------------------------------------------------------------------------------- Multi Wound Chart Details Patient Name: [name] Date of Service: 07/05/2021 11:00 AM Medical Record Number: [id number] Patient Account Number: [id number] Date of Birth/Sex: January 01, 1941 (80 y.o. 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 N/A Extremity Comorbid History: Arrhythmia,  Hypertension, Type II Arrhythmia, Hypertension, Type II N/A Diabetes, Received Radiation Diabetes, Received Radiation Date Acquired: 04/02/2021 05/25/2021 N/A Weeks of Treatment: 11 5 N/A Wound Status: Open Open N/A Wound Recurrence: No Yes N/A Measurements L x W x D (cm) 0.1x0.1x0.1 0x0x0 N/A Area (cm) : 0.008 0 N/A Volume (cm) : 0.001 0 N/A % Reduction in Area: 100.00% 100.00% N/A % Reduction in Volume: 100.00% 100.00% N/A Classification: Grade 2 Full Thickness Without Exposed N/A Support Structures Exudate Amount: Small None Present N/A Exudate Type: Serous N/A N/A Exudate Color: amber N/A N/A Granulation Amount: None Present (  0%) None Present (0%) N/A Necrotic Amount: None Present (0%) None Present (0%) N/A Exposed Structures: Fat Layer (Subcutaneous Tissue): Fascia: No N/A Yes Fat Layer (Subcutaneous Tissue): Fascia: No No Tendon: No Tendon: No Muscle: No Muscle: No Joint: No Joint: No Bone: No Bone: No Epithelialization: Large (67-100%) Large (67-100%) N/A Assessment Notes: Irritation noted periwound N/A N/A Treatment Notes Electronic Signature(s) Signed: 07/05/2021 12:03:44 PM By: Kalman Shan DO Entered By: Kalman Shan on 07/05/2021 11:57:25 [name] ([id number]) -------------------------------------------------------------------------------- Multi-Disciplinary Care Plan Details Patient Name: [name] Date of Service: 07/05/2021 11:00 AM Medical Record Number: [id number] Patient Account Number: [id number] Date of Birth/Sex: 03-16-1941 (80 y.o. M) Treating RN: Dolan Amen Primary Care Feiga Nadel: London Pepper Other Clinician: Referring Tyleek Smick: Dimas Chyle Treating Estell Dillinger/Extender: Yaakov Guthrie in Treatment: 11 Active Inactive Electronic Signature(s) Signed: 07/05/2021 4:30:44 PM By: Dolan Amen RN Entered By: Dolan Amen on 07/05/2021 11:44:35 [name]  ([id number]) -------------------------------------------------------------------------------- Pain Assessment Details Patient Name: [name] Date of Service: 07/05/2021 11:00 AM Medical Record Number: [id number] Patient Account Number: [id number] Date of Birth/Sex: 1941-02-25 (80 y.o. M) Treating RN: Dolan Amen Primary Care Jefte Carithers: London Pepper Other Clinician: Referring Navada Osterhout: Dimas Chyle Treating Kenyatta Keidel/Extender: Yaakov Guthrie in Treatment: 11 Active Problems Location of Pain Severity and Description of Pain Patient Has Paino No Site Locations Rate the pain. Current Pain Level: 0 Pain Management and Medication Current Pain Management: Electronic Signature(s) Signed: 07/05/2021 4:30:44 PM By: Dolan Amen RN Entered By: Dolan Amen on 07/05/2021 11:06:35 [name] ([id number]) -------------------------------------------------------------------------------- Patient/Caregiver Education Details Patient Name: [name] Date of Service: 07/05/2021 11:00 AM Medical Record Number: [id number] Patient Account Number: [id number] Date of Birth/Gender: 1940-12-29 (80 y.o. M) Treating RN: Dolan Amen Primary Care Physician: London Pepper Other Clinician: Referring Physician: Dimas Chyle Treating Physician/Extender: Yaakov Guthrie in Treatment: 11 Education Assessment Education Provided To: Patient Education Topics Provided Wound/Skin Impairment: Methods: Explain/Verbal Responses: State content correctly Electronic Signature(s) Signed: 07/05/2021 4:30:44 PM By: Dolan Amen RN Entered By: Dolan Amen on 07/05/2021 12:02:25 [name] ([id number]) -------------------------------------------------------------------------------- Wound Assessment Details Patient Name: [name] Date of Service: 07/05/2021 11:00 AM Medical Record Number: [id number] Patient Account Number: [id number] Date of Birth/Sex: 21-Sep-1941 (80 y.o.  M) Treating RN: Dolan Amen Primary Care Crew Goren: London Pepper Other Clinician: Referring Sahara Fujimoto: Dimas Chyle Treating Chermaine Schnyder/Extender: Yaakov Guthrie in Treatment: 11 Wound Status Wound Number: 1 Primary Diabetic Wound/Ulcer of the Lower Extremity Etiology: Wound Location: Left, Medial Lower Leg Wound Status: Open Wounding Event: Trauma Comorbid Arrhythmia, Hypertension, Type II Diabetes, Received Date Acquired: 04/02/2021 History: Radiation Weeks Of Treatment: 11 Clustered Wound: No Photos Wound Measurements Length: (cm) 0.1 % Re Width: (cm) 0.1 % Re Depth: (cm) 0.1 Epit Area: (cm) 0.008 Tun Volume: (cm) 0.001 Und duction in Area: 100% duction in Volume: 100% helialization: Large (67-100%) neling: No ermining: No Wound Description Classification: Grade 2 Fou Exudate Amount: Small Slo Exudate Type: Serous Exudate Color: amber l Odor After Cleansing: No ugh/Fibrino No Wound Bed Granulation Amount: None Present (0%) Exposed Structure Necrotic Amount: None Present (0%) Fascia Exposed: No Fat Layer (Subcutaneous Tissue) Exposed: Yes Tendon Exposed: No Muscle Exposed: No Joint Exposed: No Bone Exposed: No Assessment Notes Irritation noted periwound Treatment Notes Wound #1 (Lower Leg) Wound Laterality: Left, Medial Cleanser Soap and Water [name], [name] ([id number]) Discharge Instruction: Gently cleanse wound with antibacterial soap, rinse and pat dry prior to dressing wounds Peri-Wound Care Desitin Maximum Strength Ointment 4 (oz) Discharge Instruction: Mix with  nystatin and TCA Nystatin Cream USP 30 (g) Discharge Instruction: Mix with TCA and ZInc. Triamcinolone Acetonide Cream, 0.1%, 15 (g) tube Discharge Instruction: Mix with nystatin and ZInc Topical Primary Dressing Hydrofera Blue Ready Transfer Foam, 4x5 (in/in) Discharge Instruction: Apply to wound and any reddened spots Secondary Dressing ABD Pad 5x9 (in/in) Discharge  Instruction: Cover with ABD pad Kerlix 4.5 x 4.1 (in/yd) Discharge Instruction: Wrap from base of toes to bend of knee Secured With Coban Cohesive Bandage 4x5 (yds) Stretched Discharge Instruction: Wrap from base of toes to bend of knee Compression Wrap Compression Stockings Add-Ons Electronic Signature(s) Signed: 07/05/2021 4:30:44 PM By: Dolan Amen RN Entered By: Dolan Amen on 07/05/2021 11:16:54 [name] ([id number]) -------------------------------------------------------------------------------- Wound Assessment Details Patient Name: [name] Date of Service: 07/05/2021 11:00 AM Medical Record Number: [id number] Patient Account Number: [id number] Date of Birth/Sex: 02-Jul-1941 (80 y.o. 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: Wound Location: Right, Medial Lower Leg Wound Status: Open Wounding Event: Blister Comorbid Arrhythmia, Hypertension, Type II Diabetes, Received Date Acquired: 05/25/2021 History: Radiation Weeks Of Treatment: 5 Clustered Wound: No Photos Wound Measurements Length: (cm) 0 Width: (cm) 0 Depth: (cm) 0 Area: (cm) 0 Volume: (cm) 0 % Reduction in Area: 100% % Reduction in Volume: 100% Epithelialization: Large (67-100%) Tunneling: No Undermining: No Wound Description Classification: Full Thickness Without Exposed Support Structure Exudate Amount: None Present s Foul Odor After Cleansing: No Slough/Fibrino No Wound Bed Granulation Amount: None Present (0%) Exposed Structure Necrotic Amount: None Present (0%) Fascia Exposed: No Fat Layer (Subcutaneous Tissue) Exposed: No Tendon Exposed: No Muscle Exposed: No Joint Exposed: No Bone Exposed: No Electronic Signature(s) Signed: 07/05/2021 4:30:44 PM By: Dolan Amen  RN Entered By: Dolan Amen on 07/05/2021 11:17:18 [name] ([id number]) -------------------------------------------------------------------------------- [place] Details Patient Name: [name] Date of Service: 07/05/2021 11:00 AM Medical Record Number: [id number] Patient Account Number: [id number] Date of Birth/Sex: May 13, 1941 (80 y.o. M) Treating RN: Dolan Amen Primary Care Nena Hampe: London Pepper Other Clinician: Referring Naveh Rickles: Dimas Chyle Treating Josey Dettmann/Extender: Yaakov Guthrie in Treatment: 11 Vital Signs Time Taken: 11:06 Temperature (F): 98.4 Height (in): 68 Pulse (bpm): 78 Weight (lbs): 180 Respiratory Rate (breaths/min): 18 Body Mass Index (BMI): 27.4 Blood Pressure (mmHg): 144/66 Reference Range: 80 - 120 mg / dl Electronic Signature(s) Signed: 07/05/2021 4:30:44 PM By: Dolan Amen RN Entered By: Dolan Amen on 07/05/2021 62:95:28

## 2021-07-12 ENCOUNTER — Other Ambulatory Visit: Payer: Self-pay

## 2021-07-12 ENCOUNTER — Encounter: Payer: Medicare Other | Attending: Internal Medicine | Admitting: Internal Medicine

## 2021-07-12 ENCOUNTER — Ambulatory Visit (INDEPENDENT_AMBULATORY_CARE_PROVIDER_SITE_OTHER): Payer: Medicare Other | Admitting: *Deleted

## 2021-07-12 DIAGNOSIS — Z0000 Encounter for general adult medical examination without abnormal findings: Secondary | ICD-10-CM | POA: Diagnosis not present

## 2021-07-12 DIAGNOSIS — I890 Lymphedema, not elsewhere classified: Secondary | ICD-10-CM | POA: Diagnosis not present

## 2021-07-12 DIAGNOSIS — Z87891 Personal history of nicotine dependence: Secondary | ICD-10-CM | POA: Insufficient documentation

## 2021-07-12 DIAGNOSIS — Z8616 Personal history of COVID-19: Secondary | ICD-10-CM | POA: Insufficient documentation

## 2021-07-12 DIAGNOSIS — Z7901 Long term (current) use of anticoagulants: Secondary | ICD-10-CM | POA: Diagnosis not present

## 2021-07-12 DIAGNOSIS — X58XXXD Exposure to other specified factors, subsequent encounter: Secondary | ICD-10-CM | POA: Diagnosis not present

## 2021-07-12 DIAGNOSIS — S8012XD Contusion of left lower leg, subsequent encounter: Secondary | ICD-10-CM | POA: Diagnosis not present

## 2021-07-12 DIAGNOSIS — E11622 Type 2 diabetes mellitus with other skin ulcer: Secondary | ICD-10-CM

## 2021-07-12 DIAGNOSIS — I480 Paroxysmal atrial fibrillation: Secondary | ICD-10-CM | POA: Diagnosis not present

## 2021-07-12 DIAGNOSIS — S81801D Unspecified open wound, right lower leg, subsequent encounter: Secondary | ICD-10-CM | POA: Insufficient documentation

## 2021-07-12 DIAGNOSIS — Z923 Personal history of irradiation: Secondary | ICD-10-CM | POA: Diagnosis not present

## 2021-07-12 DIAGNOSIS — E1122 Type 2 diabetes mellitus with diabetic chronic kidney disease: Secondary | ICD-10-CM | POA: Insufficient documentation

## 2021-07-12 DIAGNOSIS — N183 Chronic kidney disease, stage 3 unspecified: Secondary | ICD-10-CM | POA: Diagnosis not present

## 2021-07-12 DIAGNOSIS — L97822 Non-pressure chronic ulcer of other part of left lower leg with fat layer exposed: Secondary | ICD-10-CM | POA: Diagnosis not present

## 2021-07-12 DIAGNOSIS — I872 Venous insufficiency (chronic) (peripheral): Secondary | ICD-10-CM | POA: Diagnosis not present

## 2021-07-12 DIAGNOSIS — Z8546 Personal history of malignant neoplasm of prostate: Secondary | ICD-10-CM | POA: Diagnosis not present

## 2021-07-12 DIAGNOSIS — E785 Hyperlipidemia, unspecified: Secondary | ICD-10-CM | POA: Insufficient documentation

## 2021-07-12 DIAGNOSIS — I129 Hypertensive chronic kidney disease with stage 1 through stage 4 chronic kidney disease, or unspecified chronic kidney disease: Secondary | ICD-10-CM | POA: Diagnosis not present

## 2021-07-12 NOTE — Progress Notes (Signed)
KOVEN, BELINSKY ([id number]) Visit Report for 07/12/2021 Arrival Information Details Patient Name: [name], [name] Date of Service: 07/12/2021 8:00 AM Medical Record Number: [id number] Patient Account Number: [id number] Date of Birth/Sex: 1941-07-09 (80 y.o. M) Treating RN: Donnamarie Poag Primary Care Blanca Carreon: London Pepper Other Clinician: Referring Ishmail Mcmanamon: London Pepper Treating Emanuelle Hammerstrom/Extender: Yaakov Guthrie in Treatment: 12 Visit Information History Since Last Visit Added or deleted any medications: No Patient Arrived: Ambulatory Had a fall or experienced change in No Arrival Time: 08:04 activities of daily living that may affect Accompanied By: self risk of falls: Transfer Assistance: None Hospitalized since last visit: No Patient Identification Verified: Yes Has Dressing in Place as Prescribed: Yes Secondary Verification Process Completed: Yes Pain Present Now: No Patient Requires Transmission-Based No Precautions: Patient Has Alerts: Yes Patient Alerts: Patient on Blood Thinner DIABETIC Electronic Signature(s) Signed: 07/12/2021 11:13:00 AM By: Donnamarie Poag Entered By: Donnamarie Poag on 07/12/2021 08:06:36 [name] ([id number]) -------------------------------------------------------------------------------- Clinic Level of Care Assessment Details Patient Name: [name] Date of Service: 07/12/2021 8:00 AM Medical Record Number: [id number] Patient Account Number: [id number] Date of Birth/Sex: 23-Mar-1941 (80 y.o. M) Treating RN: Donnamarie Poag Primary Care Amarius Toto: London Pepper Other Clinician: Referring Makya Phillis: London Pepper Treating Jeane Cashatt/Extender: Yaakov Guthrie in Treatment: 12 Clinic Level of Care Assessment Items TOOL 4 Quantity Score []  - Use when only an EandM is performed on FOLLOW-UP visit 0 ASSESSMENTS - Nursing Assessment / Reassessment []  - Reassessment of Co-morbidities (includes updates in patient status) 0 []  - 0 Reassessment of  Adherence to Treatment Plan ASSESSMENTS - Wound and Skin Assessment / Reassessment X - Simple Wound Assessment / Reassessment - one wound 1 5 []  - 0 Complex Wound Assessment / Reassessment - multiple wounds []  - 0 Dermatologic / Skin Assessment (not related to wound area) ASSESSMENTS - Focused Assessment []  - Circumferential Edema Measurements - multi extremities 0 []  - 0 Nutritional Assessment / Counseling / Intervention []  - 0 Lower Extremity Assessment (monofilament, tuning fork, pulses) []  - 0 Peripheral Arterial Disease Assessment (using hand held doppler) ASSESSMENTS - Ostomy and/or Continence Assessment and Care []  - Incontinence Assessment and Management 0 []  - 0 Ostomy Care Assessment and Management (repouching, etc.) PROCESS - Coordination of Care X - Simple Patient / Family Education for ongoing care 1 15 []  - 0 Complex (extensive) Patient / Family Education for ongoing care X- 1 10 Staff obtains Programmer, systems, Records, Test Results / Process Orders []  - 0 Staff telephones HHA, Nursing Homes / Clarify orders / etc []  - 0 Routine Transfer to another Facility (non-emergent condition) []  - 0 Routine Hospital Admission (non-emergent condition) []  - 0 New Admissions / Biomedical engineer / Ordering NPWT, Apligraf, etc. []  - 0 Emergency Hospital Admission (emergent condition) []  - 0 Simple Discharge Coordination []  - 0 Complex (extensive) Discharge Coordination PROCESS - Special [name] []  - Pediatric / Minor Patient Management 0 []  - 0 Isolation Patient Management []  - 0 Hearing / Language / Visual special [name] []  - 0 Assessment of Community assistance (transportation, D/C planning, etc.) []  - 0 Additional assistance / Altered mentation []  - 0 Support Surface(s) Assessment (bed, cushion, seat, etc.) INTERVENTIONS - Wound Cleansing / Measurement [name], [name] ([id number]) X- 1 5 Simple Wound Cleansing - one wound []  - 0 Complex Wound Cleansing - multiple  wounds X- 1 5 Wound Imaging (photographs - any number of wounds) []  - 0 Wound Tracing (instead of photographs) X- 1 5 Simple Wound Measurement - one wound []  - 0 Complex Wound  Measurement - multiple wounds INTERVENTIONS - Wound Dressings []  - Small Wound Dressing one or multiple wounds 0 []  - 0 Medium Wound Dressing one or multiple wounds []  - 0 Large Wound Dressing one or multiple wounds []  - 0 Application of Medications - topical []  - 0 Application of Medications - injection INTERVENTIONS - Miscellaneous []  - External ear exam 0 []  - 0 Specimen Collection (cultures, biopsies, blood, body fluids, etc.) []  - 0 Specimen(s) / Culture(s) sent or taken to Lab for analysis []  - 0 Patient Transfer (multiple staff / Civil Service fast streamer / Similar devices) []  - 0 Simple Staple / Suture removal (25 or less) []  - 0 Complex Staple / Suture removal (26 or more) []  - 0 Hypo / Hyperglycemic Management (close monitor of Blood Glucose) []  - 0 Ankle / Brachial Index (ABI) - do not check if billed separately X- 1 5 Vital Signs Has the patient been seen at the hospital within the last three years: Yes Total Score: 50 Level Of Care: New/Established - Level 2 Electronic Signature(s) Signed: 07/12/2021 11:13:00 AM By: Donnamarie Poag Entered By: Donnamarie Poag on 07/12/2021 08:33:35 [name] ([id number]) -------------------------------------------------------------------------------- Encounter Discharge Information Details Patient Name: [name] Date of Service: 07/12/2021 8:00 AM Medical Record Number: [id number] Patient Account Number: [id number] Date of Birth/Sex: November 21, 1940 (80 y.o. M) Treating RN: Donnamarie Poag Primary Care Gusta Marksberry: London Pepper Other Clinician: Referring Anzal Bartnick: London Pepper Treating Aleila Syverson/Extender: Yaakov Guthrie in Treatment: 12 Encounter Discharge Information Items Discharge Condition: Stable Ambulatory Status: Ambulatory Discharge Destination:  Home Transportation: Private Auto Accompanied By: self Schedule Follow-up Appointment: Yes Clinical Summary of Care: Electronic Signature(s) Signed: 07/12/2021 11:13:00 AM By: Donnamarie Poag Entered By: Donnamarie Poag on 07/12/2021 08:34:18 [name] ([id number]) -------------------------------------------------------------------------------- Lower Extremity Assessment Details Patient Name: [name] Date of Service: 07/12/2021 8:00 AM Medical Record Number: [id number] Patient Account Number: [id number] Date of Birth/Sex: [birth year] (80 y.o. M) Treating RN: Donnamarie Poag Primary Care Raylinn Kosar: London Pepper Other Clinician: Referring Ladeana Laplant: London Pepper Treating Aasha Dina/Extender: Yaakov Guthrie in Treatment: 12 Edema Assessment Assessed: [Left: Yes] [Right: No] Edema: [Left: N] [Right: o] Calf Left: Right: Point of Measurement: 31 cm From Medial Instep 33 cm Ankle Left: Right: Point of Measurement: 9 cm From Medial Instep 22 cm Knee To Floor Left: Right: From Medial Instep 44 cm Vascular Assessment Pulses: Dorsalis Pedis Palpable: [Left:Yes] Electronic Signature(s) Signed: 07/12/2021 11:13:00 AM By: Donnamarie Poag Entered By: Donnamarie Poag on 07/12/2021 08:14:53 [name] ([id number]) -------------------------------------------------------------------------------- Multi Wound Chart Details Patient Name: [name] Date of Service: 07/12/2021 8:00 AM Medical Record Number: [id number] Patient Account Number: [id number] Date of Birth/Sex: 1940-12-08 (80 y.o. M) Treating RN: Donnamarie Poag Primary Care Marcial Pless: London Pepper Other Clinician: Referring Allahna Husband: London Pepper Treating Toluwanimi Radebaugh/Extender: Yaakov Guthrie in Treatment: 12 Vital Signs Height(in): 68 Pulse(bpm): 75 Weight(lbs): 180 Blood Pressure(mmHg): 130/67 Body Mass Index(BMI): 27 Temperature(F): 97.6 Respiratory Rate(breaths/min): 16 Photos: [N/A:N/A] Wound Location: Left, Medial  Lower Leg N/A N/A Wounding Event: Trauma N/A N/A Primary Etiology: Diabetic Wound/Ulcer of the Lower N/A N/A Extremity Comorbid History: Arrhythmia, Hypertension, Type II N/A N/A Diabetes, Received Radiation Date Acquired: 04/02/2021 N/A N/A Weeks of Treatment: 12 N/A N/A Wound Status: Healed - Epithelialized N/A N/A Measurements L x W x D (cm) 0x0x0 N/A N/A Area (cm) : 0 N/A N/A Volume (cm) : 0 N/A N/A % Reduction in Area: 100.00% N/A N/A % Reduction in Volume: 100.00% N/A N/A Classification: Grade 2 N/A N/A Exudate Amount: None Present N/A N/A Granulation  Amount: None Present (0%) N/A N/A Necrotic Amount: None Present (0%) N/A N/A Exposed Structures: Fascia: No N/A N/A Fat Layer (Subcutaneous Tissue): No Tendon: No Muscle: No Joint: No Bone: No Epithelialization: Large (67-100%) N/A N/A Treatment Notes Wound #1 (Lower Leg) Wound Laterality: Left, Medial Cleanser Peri-Wound Care Topical Primary Dressing Secondary Dressing Secured With [name], [name] ([id number]) Compression Wrap Compression Stockings Add-Ons Electronic Signature(s) Signed: 07/12/2021 9:20:01 AM By: Gene Shan DO Entered By: [name] on 07/12/2021 09:08:58 [name] ([id number]) -------------------------------------------------------------------------------- Multi-Disciplinary Care Plan Details Patient Name: [name] Date of Service: 07/12/2021 8:00 AM Medical Record Number: [id number] Patient Account Number: [id number] Date of Birth/Sex: [birth year] (80 y.o. M) Treating RN: Donnamarie Poag Primary Care Japheth Diekman: London Pepper Other Clinician: Referring Kalen Ratajczak: London Pepper Treating Clayden Withem/Extender: Yaakov Guthrie in Treatment: 12 Active Inactive Electronic Signature(s) Signed: 07/12/2021 11:13:00 AM By: Donnamarie Poag Entered By: Donnamarie Poag on 07/12/2021 08:16:19 [name]  ([id number]) -------------------------------------------------------------------------------- Pain Assessment Details Patient Name: [name] Date of Service: 07/12/2021 8:00 AM Medical Record Number: [id number] Patient Account Number: [id number] Date of Birth/Sex: 1941/05/08 (80 y.o. M) Treating RN: Donnamarie Poag Primary Care Lula Kolton: London Pepper Other Clinician: Referring Dallis Czaja: London Pepper Treating Cebert Dettmann/Extender: Yaakov Guthrie in Treatment: 12 Active Problems Location of Pain Severity and Description of Pain Patient Has Paino No Site Locations Rate the pain. Current Pain Level: 0 Pain Management and Medication Current Pain Management: Electronic Signature(s) Signed: 07/12/2021 11:13:00 AM By: Donnamarie Poag Entered By: Donnamarie Poag on 07/12/2021 08:12:56 [name] ([id number]) -------------------------------------------------------------------------------- Patient/Caregiver Education Details Patient Name: [name] Date of Service: 07/12/2021 8:00 AM Medical Record Number: [id number] Patient Account Number: [id number] Date of Birth/Gender: [birth year] (80 y.o. M) Treating RN: Donnamarie Poag Primary Care Physician: London Pepper Other Clinician: Referring Physician: London Pepper Treating Physician/Extender: Yaakov Guthrie in Treatment: 12 Education Assessment Education Provided To: Patient Education Topics Provided Basic Hygiene: Wound/Skin Impairment: Electronic Signature(s) Signed: 07/12/2021 11:13:00 AM By: Donnamarie Poag Entered By: Donnamarie Poag on 07/12/2021 08:33:48 [name] ([id number]) -------------------------------------------------------------------------------- Wound Assessment Details Patient Name: [name] Date of Service: 07/12/2021 8:00 AM Medical Record Number: [id number] Patient Account Number: [id number] Date of Birth/Sex: [birth year] (80 y.o. M) Treating RN: Donnamarie Poag Primary Care Mithcell Schumpert: London Pepper Other  Clinician: Referring Letty Salvi: London Pepper Treating Yentl Verge/Extender: Yaakov Guthrie in Treatment: 12 Wound Status Wound Number: 1 Primary Diabetic Wound/Ulcer of the Lower Extremity Etiology: Wound Location: Left, Medial Lower Leg Wound Status: Healed - Epithelialized Wounding Event: Trauma Comorbid Arrhythmia, Hypertension, Type II Diabetes, Received Date Acquired: 04/02/2021 History: Radiation Weeks Of Treatment: 12 Clustered Wound: No Photos Wound Measurements Length: (cm) 0 % Red Width: (cm) 0 % Red Depth: (cm) 0 Epith Area: (cm) 0 Tunn Volume: (cm) 0 Unde uction in Area: 100% uction in Volume: 100% elialization: Large (67-100%) eling: No rmining: No Wound Description Classification: Grade 2 Foul Exudate Amount: None Present Sloug Odor After Cleansing: No h/Fibrino No Wound Bed Granulation Amount: None Present (0%) Exposed Structure Necrotic Amount: None Present (0%) Fascia Exposed: No Fat Layer (Subcutaneous Tissue) Exposed: No Tendon Exposed: No Muscle Exposed: No Joint Exposed: No Bone Exposed: No Treatment Notes Wound #1 (Lower Leg) Wound Laterality: Left, Medial Cleanser Peri-Wound Care Topical Primary Dressing [name], [name] ([id number]) Secondary Dressing Secured With Compression Wrap Compression Stockings Add-Ons Electronic Signature(s) Signed: 07/12/2021 11:13:00 AM By: Donnamarie Poag Entered By: Donnamarie Poag on 07/12/2021 08:28:30 [name] ([id number]) -------------------------------------------------------------------------------- [place] Details Patient Name: [name] Date of Service: 07/12/2021 8:00 AM Medical Record Number: [id number]  Patient Account Number: [id number] Date of Birth/Sex: 09/24/1941 (80 y.o. M) Treating RN: Donnamarie Poag Primary Care Nicolet Griffy: London Pepper Other Clinician: Referring Odessa Nishi: London Pepper Treating Poseidon Pam/Extender: Yaakov Guthrie in Treatment: 12 Vital Signs Time Taken:  08:10 Temperature (F): 97.6 Height (in): 68 Pulse (bpm): 75 Weight (lbs): 180 Respiratory Rate (breaths/min): 16 Body Mass Index (BMI): 27.4 Blood Pressure (mmHg): 130/67 Reference Range: 80 - 120 mg / dl Electronic Signature(s) Signed: 07/12/2021 11:13:00 AM By: Donnamarie Poag Entered ByDonnamarie Poag on 07/12/2021 08:12:48
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
## 2021-07-12 NOTE — Progress Notes (Signed)
Subjective:   [name] is a 80 y.o. male who presents for Medicare Annual/Subsequent preventive examination.  I connected with  [name] on 07/12/21 by a audio enabled telemedicine application and verified that I am speaking with the correct person using two identifiers.   I discussed the limitations of evaluation and management by telemedicine. The patient expressed understanding and agreed to proceed.   Locations of Patient: Home Location of Provider: Office Person Participating in visit: [name], Rennis Harding, RN  Review of Systems    Refer To PCP     Objective:    There were no vitals filed for this visit. There is no height or weight on file to calculate BMI.  Advanced Directives 04/02/2021 01/28/2021 01/15/2020 05/20/2019 05/20/2019 03/24/2019 12/12/2018  Does Patient Have a Medical Advance Directive? No Yes Yes - Yes Yes Yes  Type of Advance Directive - Healthcare Power of Attorney Living will;Healthcare Power of [place] of [place];Living will  Does patient want to make changes to medical advance directive? - No - Guardian declined No - Patient declined - No - Patient declined No - Patient declined No - Patient declined  Copy of [place] in Chart? - Yes - validated most recent copy scanned in chart (See row information) No - copy requested - No - copy requested - No - copy requested  Would patient like information on creating a medical advance directive? No - Patient declined - - - No - Patient declined - -    Current Medications (verified) Outpatient Encounter Medications as of 07/12/2021  Medication Sig   atorvastatin (LIPITOR) 80 MG tablet TAKE 1 TABLET BY MOUTH  DAILY (Patient taking differently: Take 80 mg by mouth daily.)   ELIQUIS 5 MG TABS tablet TAKE 1 TABLET BY MOUTH  TWICE DAILY   fluticasone (FLONASE) 50 MCG/ACT nasal spray  Place 2 sprays into both nostrils daily. 2 sprays in each nostrils as needed.   losartan-hydrochlorothiazide (HYZAAR) 100-25 MG tablet TAKE 1 TABLET BY MOUTH  DAILY (Patient taking differently: Take 1 tablet by mouth daily.)   losartan-hydrochlorothiazide (HYZAAR) 100-25 MG tablet Take 1 tablet by mouth daily.   metFORMIN (GLUCOPHAGE-XR) 500 MG 24 hr tablet Take 500 mg by mouth daily with breakfast.   Multiple Vitamins-Minerals (MENS MULTIVITAMIN PO) Take 1 tablet by mouth daily.   Multiple Vitamins-Minerals (MULTIVITAMIN ADULTS) TABS Take 1 tablet by mouth daily.   tamsulosin (FLOMAX) 0.4 MG CAPS capsule Take 0.4 mg by mouth at bedtime.    tamsulosin (FLOMAX) 0.4 MG CAPS capsule Take 0.4 mg by mouth daily.   ELIQUIS 5 MG TABS tablet Take 1 tablet (5 mg total) by mouth 2 (two) times daily. Restart on 04/07/2021 (Patient not taking: Reported on 07/12/2021)   triamcinolone cream (KENALOG) 0.1 % Apply 1 application topically 2 (two) times daily. For 2 weeks, then as needed (Patient not taking: Reported on 07/12/2021)   [DISCONTINUED] atorvastatin (LIPITOR) 80 MG tablet Take 1 tablet by mouth at bedtime.   No facility-administered encounter medications on file as of 07/12/2021.    Allergies (verified) Lovastatin and Simvastatin   History: Past Medical History:  Diagnosis Date   CKD (chronic kidney disease), stage III (HCC)    Dyslipidemia    ED (erectile dysfunction)    History of Guillain-Barre syndrome    per pt 1990s fron Flu Vaccine,  stated resolved with no residual   History of  nuclear stress test    08-23-2004  by dr Ron Parker--- normal without ischemia,  ef 68%   Hyperplasia of prostate with lower urinary tract symptoms (LUTS)    Hypertension    NASH (nonalcoholic steatohepatitis) 2010   followed by pcp   Prostate cancer [facility]) urologist-- dr winter;  oncologist-- dr Tammi Klippel   dx 02-26-2019,  Stage T1c,  Gleason 3+4, vol 80.1cc;  plan external radiation   Rhinorrhea    Type 2 diabetes  mellitus ([place])    Past Surgical History:  Procedure Laterality Date   CARPAL TUNNEL RELEASE Right 1990s   GOLD SEED IMPLANT N/A 05/20/2019   Procedure: GOLD SEED IMPLANT;  Surgeon: Ceasar Mons, MD;  Location: [facility];  Service: Urology;  Laterality: N/A;   HERNIA REPAIR  infant   prostate biopsy  02-26-2019  dr winter office   SPACE OAR INSTILLATION N/A 05/20/2019   Procedure: SPACE OAR INSTILLATION;  Surgeon: Ceasar Mons, MD;  Location: [facility];  Service: Urology;  Laterality: N/A;   TONSILLECTOMY  child   Family History  Problem Relation Age of Onset   Congestive Heart Failure Mother    Cirrhosis Father    Cancer Neg Hx    Social History   Socioeconomic History   Marital status: Widowed    Spouse name: Not on file   Number of children: 2   Years of education: Not on file   Highest education level: Not on file  Occupational History   Occupation: Retired  Tobacco Use   Smoking status: Never   Smokeless tobacco: Never  Vaping Use   Vaping Use: Never used  Substance and Sexual Activity   Alcohol use: No    Alcohol/week: 0.0 standard drinks   Drug use: Never   Sexual activity: Not Currently  Other Topics Concern   Not on file  Social History Narrative   ** Merged History Encounter **       Lives alone Drives     Social Determinants of Health   Financial Resource Strain: Low Risk    Difficulty of Paying Living Expenses: Not hard at all  Food Insecurity: Not on file  Transportation Needs: No Transportation Needs   Lack of Transportation (Medical): No   Lack of Transportation (Non-Medical): No  Physical Activity: Inactive   Days of Exercise per Week: 0 days   Minutes of Exercise per Session: 0 min  Stress: Not on file  Social Connections: Moderately Isolated   Frequency of Communication with Friends and Family: More than three times a week   Frequency of Social Gatherings with Friends and Family:  More than three times a week   Attends Religious Services: Never   Marine scientist or Organizations: Yes   Attends Archivist Meetings: 1 to 4 times per year   Marital Status: Widowed    Tobacco Counseling Counseling given: Not Answered   Clinical Intake:  Pre-visit preparation completed: Yes  Pain : No/denies pain     Nutritional Risks: None Diabetes: Yes CBG done?: No  How often do you need to have someone help you when you read instructions, pamphlets, or other written materials from your doctor or pharmacy?: 1 - Never What is the last grade level you completed in school?: 12, 1 year of business school  Diabetic?Yes  Interpreter Needed?: No  Information entered by :: Rennis Harding, RN   Activities of Daily Living In your present state of health, do you have any  difficulty performing the following activities: 04/02/2021 01/28/2021  Hearing? N Y  Vision? N N  Difficulty concentrating or making decisions? N N  Walking or climbing stairs? N N  Dressing or bathing? N N  Doing errands, shopping? N N  Some recent data might be hidden    Patient Care Team: London Pepper, MD as PCP - General (Family Medicine) Rutherford Guys, MD as Consulting Physician (Ophthalmology) Cira Rue, RN Nurse Navigator as Registered Nurse (Medical Oncology) Ceasar Mons, MD as Consulting Physician (Urology) Gardiner Barefoot, DPM as Consulting Physician (Podiatry) Vivi Barrack, MD (Family Medicine)  Indicate any recent Medical Services you may have received from other than [facility] providers in the past year (date may be approximate).     Assessment:   This is a routine wellness examination for [place].  Hearing/Vision screen No results found.  Dietary issues and exercise activities discussed:     Goals Addressed   None    Depression Screen PHQ 2/9 Scores 02/07/2021 01/15/2020 06/08/2019 06/08/2019 12/12/2018 07/02/2018  PHQ - 2 Score 0 1 0 0 0 0  PHQ- 9 Score -  - 1 - - -    Fall Risk Fall Risk  02/07/2021 01/15/2020 06/08/2019 12/12/2018  Falls in the past year? 0 0 0 0  Number falls in past yr: 0 0 - -  Injury with Fall? 0 0 - -  Follow up - Falls evaluation completed;Education provided;Falls prevention discussed - -    FALL RISK PREVENTION PERTAINING TO THE HOME:  Any stairs in or around the home? Yes If so, are there any without handrails? No  Home free of loose throw rugs in walkways, pet beds, electrical cords, etc? Yes  Adequate lighting in your home to reduce risk of falls? Yes   ASSISTIVE DEVICES UTILIZED TO PREVENT FALLS:  Life alert? No  Use of a cane, walker or w/c? No  Grab bars in the bathroom? No  Shower chair or bench in shower? No  Elevated toilet seat or a handicapped toilet? No   TIMED UP AND GO:  Was the test performed? No .  Length of time to ambulate 10 feet: N/A sec.     Cognitive Function: MMSE - Mini Mental State Exam 12/12/2018  Orientation to time 5  Orientation to Place 5  Registration 3  Attention/ Calculation 3  Recall 3  Language- name 2 objects 2  Language- repeat 1  Language- follow 3 step command 3  Language- read & follow direction 1  Write a sentence 1  Copy design 1  Total score 28     6CIT Screen 07/12/2021 01/15/2020  What Year? 0 points 0 points  What month? 0 points 0 points  What time? 0 points 0 points  Count back from 20 0 points 0 points  Months in reverse 0 points 0 points  Repeat phrase 0 points 0 points  Total Score 0 0    Immunizations Immunization History  Administered Date(s) Administered   Pneumococcal Conjugate-13 02/23/2016   Pneumococcal Polysaccharide-23 11/15/2008   Td 08/06/2003, 02/20/2013   Tdap 04/02/2021   Zoster, Live 12/27/2014    TDAP status: Up to date  Flu Vaccine status: Declined, Education has been provided regarding the importance of this vaccine but patient still declined. Advised may receive this vaccine at local pharmacy or Health Dept. Aware to  provide a copy of the vaccination record if obtained from local pharmacy or Health Dept. Verbalized acceptance and understanding.  Pneumococcal vaccine status: Declined,  Education has been provided regarding the importance of this vaccine but patient still declined. Advised may receive this vaccine at local pharmacy or Health Dept. Aware to provide a copy of the vaccination record if obtained from local pharmacy or Health Dept. Verbalized acceptance and understanding.   Covid-19 vaccine status: Declined, Education has been provided regarding the importance of this vaccine but patient still declined. Advised may receive this vaccine at local pharmacy or Health Dept.or vaccine clinic. Aware to provide a copy of the vaccination record if obtained from local pharmacy or Health Dept. Verbalized acceptance and understanding.  Qualifies for Shingles Vaccine? Yes  Zostavax completed No   Shingrix Completed?: No.    Education has been provided regarding the importance of this vaccine. Patient has been advised to call insurance company to determine out of pocket expense if they have not yet received this vaccine. Advised may also receive vaccine at local pharmacy or Health Dept. Verbalized acceptance and understanding.  Screening Tests Health Maintenance  Topic Date Due   COVID-19 Vaccine (1) Never done   Zoster Vaccines- Shingrix (1 of 2) Never done   OPHTHALMOLOGY EXAM  12/03/2015   HEMOGLOBIN A1C  10/03/2021   FOOT EXAM  06/02/2022   TETANUS/TDAP  04/03/2031   Hepatitis C Screening  Completed   PNA vac Low Risk Adult  Completed   HPV VACCINES  Aged Out   INFLUENZA VACCINE  Discontinued    Health Maintenance  Health Maintenance Due  Topic Date Due   COVID-19 Vaccine (1) Never done   Zoster Vaccines- Shingrix (1 of 2) Never done   OPHTHALMOLOGY EXAM  12/03/2015    Colorectal cancer screening: No longer required.   Lung Cancer Screening: (Low Dose CT Chest recommended if Age 1-80 years,  30 pack-year currently smoking OR have quit w/in 15years.) does not qualify.   Lung Cancer Screening Referral: No  Additional Screening:  Hepatitis C Screening: does qualify; Completed 11/24/08  Vision Screening: Recommended annual ophthalmology exams for early detection of glaucoma and other disorders of the eye. Is the patient up to date with their annual eye exam?  No  Who is the provider or what is the name of the office in which the patient attends annual eye exams? N/A If pt is not established with a provider, would they like to be referred to a provider to establish care? No .   Dental Screening: Recommended annual dental exams for proper oral hygiene  Community Resource Referral / Chronic Care Management: CRR required this visit?  No   CCM required this visit?  No      Plan:     I have personally reviewed and noted the following in the patient's chart:   Medical and social history Use of alcohol, tobacco or illicit drugs  Current medications and supplements including opioid prescriptions. Patient is not currently taking opioid prescriptions. Functional ability and status Nutritional status Physical activity Advanced directives List of other physicians Hospitalizations, surgeries, and ER visits in previous 12 months Vitals Screenings to include cognitive, depression, and falls Referrals and appointments  In addition, I have reviewed and discussed with patient certain preventive protocols, quality metrics, and best practice recommendations. A written personalized care plan for preventive services as well as general preventive health recommendations were provided to patient.     Rennis Harding, RN   07/12/2021   Nurse Notes: Non-Face to Face 50 minute visit   Mr. Spelman , Thank you for taking time to come for your Medicare Wellness  Visit. I appreciate your ongoing commitment to your health goals. Please review the following plan we discussed and let me know if I  can assist you in the future.   These are the goals we discussed:  Goals      Patient Stated     Maintain current health staus        This is a list of the screening recommended for you and due dates:  Health Maintenance  Topic Date Due   COVID-19 Vaccine (1) Never done   Zoster (Shingles) Vaccine (1 of 2) Never done   Eye exam for diabetics  12/03/2015   Hemoglobin A1C  10/03/2021   Complete foot exam   06/02/2022   Tetanus Vaccine  04/03/2031   Hepatitis C Screening: USPSTF Recommendation to screen - Ages 18-79 yo.  Completed   Pneumonia vaccines  Completed   HPV Vaccine  Aged Out   Flu Shot  Discontinued

## 2021-07-13 ENCOUNTER — Other Ambulatory Visit: Payer: Self-pay | Admitting: Family Medicine

## 2021-07-18 ENCOUNTER — Other Ambulatory Visit: Payer: Self-pay | Admitting: Family Medicine

## 2021-07-19 ENCOUNTER — Other Ambulatory Visit: Payer: Self-pay

## 2021-07-19 ENCOUNTER — Encounter (HOSPITAL_BASED_OUTPATIENT_CLINIC_OR_DEPARTMENT_OTHER): Payer: Medicare Other | Admitting: Internal Medicine

## 2021-07-19 DIAGNOSIS — S81801D Unspecified open wound, right lower leg, subsequent encounter: Secondary | ICD-10-CM | POA: Diagnosis not present

## 2021-07-19 DIAGNOSIS — I890 Lymphedema, not elsewhere classified: Secondary | ICD-10-CM | POA: Diagnosis not present

## 2021-07-19 DIAGNOSIS — S8012XD Contusion of left lower leg, subsequent encounter: Secondary | ICD-10-CM

## 2021-07-19 DIAGNOSIS — Z923 Personal history of irradiation: Secondary | ICD-10-CM | POA: Diagnosis not present

## 2021-07-19 DIAGNOSIS — E785 Hyperlipidemia, unspecified: Secondary | ICD-10-CM | POA: Diagnosis not present

## 2021-07-19 DIAGNOSIS — Z87891 Personal history of nicotine dependence: Secondary | ICD-10-CM | POA: Diagnosis not present

## 2021-07-19 DIAGNOSIS — E11622 Type 2 diabetes mellitus with other skin ulcer: Secondary | ICD-10-CM | POA: Diagnosis not present

## 2021-07-19 DIAGNOSIS — I872 Venous insufficiency (chronic) (peripheral): Secondary | ICD-10-CM

## 2021-07-19 DIAGNOSIS — Z7901 Long term (current) use of anticoagulants: Secondary | ICD-10-CM | POA: Diagnosis not present

## 2021-07-19 DIAGNOSIS — L97822 Non-pressure chronic ulcer of other part of left lower leg with fat layer exposed: Secondary | ICD-10-CM

## 2021-07-19 DIAGNOSIS — N183 Chronic kidney disease, stage 3 unspecified: Secondary | ICD-10-CM | POA: Diagnosis not present

## 2021-07-19 DIAGNOSIS — I129 Hypertensive chronic kidney disease with stage 1 through stage 4 chronic kidney disease, or unspecified chronic kidney disease: Secondary | ICD-10-CM | POA: Diagnosis not present

## 2021-07-19 DIAGNOSIS — I480 Paroxysmal atrial fibrillation: Secondary | ICD-10-CM | POA: Diagnosis not present

## 2021-07-19 DIAGNOSIS — Z8616 Personal history of COVID-19: Secondary | ICD-10-CM | POA: Diagnosis not present

## 2021-07-19 DIAGNOSIS — E1122 Type 2 diabetes mellitus with diabetic chronic kidney disease: Secondary | ICD-10-CM | POA: Diagnosis not present
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
## 2021-07-19 NOTE — Progress Notes (Signed)
[name], [name] ([id number]) Visit Report for 07/19/2021 Arrival Information Details Patient Name: [name], [name] Date of Service: 07/19/2021 8:45 AM Medical Record Number: [id number] Patient Account Number: [id number] Date of Birth/Sex: [birth year] (80 y.o. M) Treating RN: Donnamarie Poag Primary Care Twisha Vanpelt: London Pepper Other Clinician: Referring Gerrianne Aydelott: London Pepper Treating Ferrell Claiborne/Extender: Yaakov Guthrie in Treatment: 13 Visit Information History Since Last Visit Added or deleted any medications: No Patient Arrived: Ambulatory Had a fall or experienced change in No Arrival Time: 09:05 activities of daily living that may affect Accompanied By: girlfriend risk of falls: Transfer Assistance: None Hospitalized since last visit: No Patient Identification Verified: Yes Has Dressing in Place as Prescribed: Yes Secondary Verification Process Completed: Yes Pain Present Now: No Patient Requires Transmission-Based No Precautions: Patient Has Alerts: Yes Patient Alerts: Patient on Blood Thinner DIABETIC Electronic Signature(s) Signed: 07/19/2021 11:55:18 AM By: Donnamarie Poag Entered By: Donnamarie Poag on 07/19/2021 09:10:14 [name] ([id number]) -------------------------------------------------------------------------------- Clinic Level of Care Assessment Details Patient Name: [name] Date of Service: 07/19/2021 8:45 AM Medical Record Number: [id number] Patient Account Number: [id number] Date of Birth/Sex: November 19, 1940 (80 y.o. M) Treating RN: Donnamarie Poag Primary Care Jhalen Eley: London Pepper Other Clinician: Referring Beryle Bagsby: London Pepper Treating Lyra Alaimo/Extender: Yaakov Guthrie in Treatment: 13 Clinic Level of Care Assessment Items TOOL 4 Quantity Score []  - Use when only an EandM is performed on FOLLOW-UP visit 0 ASSESSMENTS - Nursing Assessment / Reassessment []  - Reassessment of Co-morbidities (includes updates in patient status) 0 X- 1  5 Reassessment of Adherence to Treatment Plan ASSESSMENTS - Wound and Skin Assessment / Reassessment []  - Simple Wound Assessment / Reassessment - one wound 0 []  - 0 Complex Wound Assessment / Reassessment - multiple wounds X- 1 10 Dermatologic / Skin Assessment (not related to wound area) ASSESSMENTS - Focused Assessment []  - Circumferential Edema Measurements - multi extremities 0 []  - 0 Nutritional Assessment / Counseling / Intervention []  - 0 Lower Extremity Assessment (monofilament, tuning fork, pulses) []  - 0 Peripheral Arterial Disease Assessment (using hand held doppler) ASSESSMENTS - Ostomy and/or Continence Assessment and Care []  - Incontinence Assessment and Management 0 []  - 0 Ostomy Care Assessment and Management (repouching, etc.) PROCESS - Coordination of Care X - Simple Patient / Family Education for ongoing care 1 15 []  - 0 Complex (extensive) Patient / Family Education for ongoing care []  - 0 Staff obtains Programmer, systems, Records, Test Results / Process Orders []  - 0 Staff telephones HHA, Nursing Homes / Clarify orders / etc []  - 0 Routine Transfer to another Facility (non-emergent condition) []  - 0 Routine Hospital Admission (non-emergent condition) []  - 0 New Admissions / Biomedical engineer / Ordering NPWT, Apligraf, etc. []  - 0 Emergency Hospital Admission (emergent condition) X- 1 10 Simple Discharge Coordination []  - 0 Complex (extensive) Discharge Coordination PROCESS - Special [name] []  - Pediatric / Minor Patient Management 0 []  - 0 Isolation Patient Management []  - 0 Hearing / Language / Visual special [name] []  - 0 Assessment of Community assistance (transportation, D/C planning, etc.) []  - 0 Additional assistance / Altered mentation []  - 0 Support Surface(s) Assessment (bed, cushion, seat, etc.) INTERVENTIONS - Wound Cleansing / Measurement CLANCE, BAQUERO ([id number]) []  - 0 Simple Wound Cleansing - one wound []  - 0 Complex Wound  Cleansing - multiple wounds X- 1 5 Wound Imaging (photographs - any number of wounds) []  - 0 Wound Tracing (instead of photographs) []  - 0 Simple Wound Measurement - one wound []  - 0 Complex Wound Measurement -  multiple wounds INTERVENTIONS - Wound Dressings []  - Small Wound Dressing one or multiple wounds 0 []  - 0 Medium Wound Dressing one or multiple wounds []  - 0 Large Wound Dressing one or multiple wounds []  - 0 Application of Medications - topical []  - 0 Application of Medications - injection INTERVENTIONS - Miscellaneous []  - External ear exam 0 []  - 0 Specimen Collection (cultures, biopsies, blood, body fluids, etc.) []  - 0 Specimen(s) / Culture(s) sent or taken to Lab for analysis []  - 0 Patient Transfer (multiple staff / Harrel Lemon Lift / Similar devices) []  - 0 Simple Staple / Suture removal (25 or less) []  - 0 Complex Staple / Suture removal (26 or more) []  - 0 Hypo / Hyperglycemic Management (close monitor of Blood Glucose) []  - 0 Ankle / Brachial Index (ABI) - do not check if billed separately X- 1 5 Vital Signs Has the patient been seen at the hospital within the last three years: Yes Total Score: 50 Level Of Care: New/Established - Level 2 Electronic Signature(s) Signed: 07/19/2021 11:55:18 AM By: Donnamarie Poag Entered By: Donnamarie Poag on 07/19/2021 09:24:24 [name] ([id number]) -------------------------------------------------------------------------------- Encounter Discharge Information Details Patient Name: [name] Date of Service: 07/19/2021 8:45 AM Medical Record Number: [id number] Patient Account Number: [id number] Date of Birth/Sex: Aug 21, 1941 (80 y.o. M) Treating RN: Donnamarie Poag Primary Care Collyn Ribas: London Pepper Other Clinician: Referring Caylah Plouff: London Pepper Treating Majd Tissue/Extender: Yaakov Guthrie in Treatment: 13 Encounter Discharge Information Items Discharge Condition: Stable Ambulatory Status:  Ambulatory Discharge Destination: Home Transportation: Private Auto Accompanied By: girlfriend Schedule Follow-up Appointment: Yes Clinical Summary of Care: Electronic Signature(s) Signed: 07/19/2021 11:55:18 AM By: Donnamarie Poag Entered By: Donnamarie Poag on 07/19/2021 09:29:00 [name] ([id number]) -------------------------------------------------------------------------------- Lower Extremity Assessment Details Patient Name: [name] Date of Service: 07/19/2021 8:45 AM Medical Record Number: [id number] Patient Account Number: [id number] Date of Birth/Sex: 12/05/1940 (80 y.o. M) Treating RN: Donnamarie Poag Primary Care Larraine Argo: London Pepper Other Clinician: Referring Nihal Doan: London Pepper Treating Cianna Kasparian/Extender: Yaakov Guthrie in Treatment: 13 Edema Assessment Assessed: [name]: Yes] [name]: Yes] Edema: [Left: No] [Right: No] Vascular Assessment Pulses: Dorsalis Pedis Palpable: [Left:Yes] [Right:Yes] Electronic Signature(s) Signed: 07/19/2021 11:55:18 AM By: Donnamarie Poag Entered By: Donnamarie Poag on 07/19/2021 09:09:04 [name] ([id number]) -------------------------------------------------------------------------------- Multi Wound Chart Details Patient Name: [name] Date of Service: 07/19/2021 8:45 AM Medical Record Number: [id number] Patient Account Number: [id number] Date of Birth/Sex: 10-26-1941 (80 y.o. M) Treating RN: Donnamarie Poag Primary Care Heavan Francom: London Pepper Other Clinician: Referring Selby Foisy: London Pepper Treating Kaylina Cahue/Extender: Yaakov Guthrie in Treatment: 13 Vital Signs Height(in): 68 Pulse(bpm): 86 Weight(lbs): 180 Blood Pressure(mmHg): 128/60 Body Mass Index(BMI): 27 Temperature(F): 98.3 Respiratory Rate(breaths/min): 16 Wound Assessments Treatment Notes Electronic Signature(s) Signed: 07/19/2021 9:44:02 AM By: Kalman Shan DO Entered By: Kalman Shan on 07/19/2021 09:40:58 [name]  ([id number]) -------------------------------------------------------------------------------- Multi-Disciplinary Care Plan Details Patient Name: [name] Date of Service: 07/19/2021 8:45 AM Medical Record Number: [id number] Patient Account Number: [id number] Date of Birth/Sex: [birth year] (80 y.o. M) Treating RN: Donnamarie Poag Primary Care Krystianna Soth: London Pepper Other Clinician: Referring Layla Gramm: London Pepper Treating Laithan Conchas/Extender: Yaakov Guthrie in Treatment: 13 Active Inactive Electronic Signature(s) Signed: 07/19/2021 11:55:18 AM By: Donnamarie Poag Entered By: Donnamarie Poag on 07/19/2021 09:22:00 [name] ([id number]) -------------------------------------------------------------------------------- Non-Wound Condition Assessment Details Patient Name: [name] Date of Service: 07/19/2021 8:45 AM Medical Record Number: [id number] Patient Account Number: [id number] Date of Birth/Sex: Jun 24, 1941 (80 y.o. M) Treating RN: Donnamarie Poag Primary Care Angellina Ferdinand: London Pepper Other Clinician:  Referring Gene Moates: London Pepper Treating Chabely Norby/Extender: Yaakov Guthrie in Treatment: 13 Non-Wound Condition: Condition: Other Dermatologic Condition Location: Leg Side: Bilateral Photos Electronic Signature(s) Signed: 07/19/2021 11:55:18 AM By: Donnamarie Poag Entered By: Donnamarie Poag on 07/19/2021 09:09:54 [name] ([id number]) -------------------------------------------------------------------------------- Pain Assessment Details Patient Name: [name] Date of Service: 07/19/2021 8:45 AM Medical Record Number: [id number] Patient Account Number: [id number] Date of Birth/Sex: Mar 15, 1941 (80 y.o. M) Treating RN: Donnamarie Poag Primary Care Akira Adelsberger: London Pepper Other Clinician: Referring David Towson: London Pepper Treating Andrew Soria/Extender: Yaakov Guthrie in Treatment: 13 Active Problems Location of Pain Severity and Description of Pain Patient Has  Paino No Site Locations Rate the pain. Current Pain Level: 0 Pain Management and Medication Current Pain Management: Electronic Signature(s) Signed: 07/19/2021 11:55:18 AM By: Donnamarie Poag Entered By: Donnamarie Poag on 07/19/2021 09:08:40 [name] ([id number]) -------------------------------------------------------------------------------- Patient/Caregiver Education Details Patient Name: [name] Date of Service: 07/19/2021 8:45 AM Medical Record Number: [id number] Patient Account Number: [id number] Date of Birth/Gender: 1941-01-12 (80 y.o. M) Treating RN: Donnamarie Poag Primary Care Physician: London Pepper Other Clinician: Referring Physician: London Pepper Treating Physician/Extender: Yaakov Guthrie in Treatment: 13 Education Assessment Education Provided To: Patient Education Topics Provided Basic Hygiene: Electronic Signature(s) Signed: 07/19/2021 11:55:18 AM By: Donnamarie Poag Entered By: Donnamarie Poag on 07/19/2021 09:26:48 [name] ([id number]) -------------------------------------------------------------------------------- [place] Details Patient Name: [name] Date of Service: 07/19/2021 8:45 AM Medical Record Number: [id number] Patient Account Number: [id number] Date of Birth/Sex: 1941/01/07 (80 y.o. M) Treating RN: Donnamarie Poag Primary Care Dennisse Swader: London Pepper Other Clinician: Referring Indigo Barbian: London Pepper Treating Merilyn Pagan/Extender: Yaakov Guthrie in Treatment: 13 Vital Signs Time Taken: 09:07 Temperature (F): 98.3 Height (in): 68 Pulse (bpm): 86 Weight (lbs): 180 Respiratory Rate (breaths/min): 16 Body Mass Index (BMI): 27.4 Blood Pressure (mmHg): 128/60 Reference Range: 80 - 120 mg / dl Electronic Signature(s) Signed: 07/19/2021 11:55:18 AM By: Donnamarie Poag Entered ByDonnamarie Poag on 07/19/2021 39:03:00

## 2021-07-23 DIAGNOSIS — R319 Hematuria, unspecified: Secondary | ICD-10-CM | POA: Diagnosis not present

## 2021-07-25 DIAGNOSIS — L814 Other melanin hyperpigmentation: Secondary | ICD-10-CM | POA: Diagnosis not present

## 2021-07-25 DIAGNOSIS — L03818 Cellulitis of other sites: Secondary | ICD-10-CM | POA: Diagnosis not present

## 2021-07-25 DIAGNOSIS — L570 Actinic keratosis: Secondary | ICD-10-CM | POA: Diagnosis not present

## 2021-07-25 DIAGNOSIS — L821 Other seborrheic keratosis: Secondary | ICD-10-CM | POA: Diagnosis not present

## 2021-07-25 DIAGNOSIS — D225 Melanocytic nevi of trunk: Secondary | ICD-10-CM | POA: Diagnosis not present

## 2021-07-26 ENCOUNTER — Encounter: Payer: Medicare Other | Admitting: Internal Medicine
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
## 2021-08-02 ENCOUNTER — Encounter: Payer: Medicare Other | Admitting: Internal Medicine

## 2021-08-23 ENCOUNTER — Encounter: Payer: Self-pay | Admitting: Podiatry

## 2021-08-23 ENCOUNTER — Other Ambulatory Visit: Payer: Self-pay

## 2021-08-23 ENCOUNTER — Ambulatory Visit: Payer: Medicare Other | Admitting: Podiatry

## 2021-08-23 DIAGNOSIS — L600 Ingrowing nail: Secondary | ICD-10-CM

## 2021-08-23 NOTE — Progress Notes (Signed)
Subjective:  Patient ID: [name], male    DOB: 01-Jun-1941,   MRN: [id number]  Chief Complaint  Patient presents with   Nail Problem    The right big toenail is sore and hurts with pressure    80 y.o. male presents for concern of ingrown toenail of right big toe that has been sore and throbbing. The nail is thickened. Has not been soaking or tried trimming.  Patient is diabetic with Stage 3 CKD and last A1c was 6.8.  Denies any other pedal complaints. Denies n/v/f/c.   Past Medical History:  Diagnosis Date   CKD (chronic kidney disease), stage III ([place])    Dyslipidemia    ED (erectile dysfunction)    History of Guillain-Barre syndrome    per pt 1990s fron Flu Vaccine,  stated resolved with no residual   History of nuclear stress test    08-23-2004  by dr Ron Parker--- normal without ischemia,  ef 68%   Hyperplasia of prostate with lower urinary tract symptoms (LUTS)    Hypertension    NASH (nonalcoholic steatohepatitis) 2010   followed by pcp   Prostate cancer [place]) urologist-- dr winter;  oncologist-- dr Tammi Klippel   dx 02-26-2019,  Stage T1c,  Gleason 3+4, vol 80.1cc;  plan external radiation   Rhinorrhea    Type 2 diabetes mellitus ([place])     Objective:  Physical Exam: Vascular: DP/PT pulses 2/4 bilateral. CFT <3 seconds. Normal hair growth on digits. No edema.  Skin. No lacerations or abrasions bilateral feet. Thickened incurvated right hallux nail.  Musculoskeletal: MMT 5/5 bilateral lower extremities in DF, PF, Inversion and Eversion. Deceased ROM in DF of ankle joint.  Tender to medial border right hallux  Neurological: Sensation intact to light touch.   Assessment:   1. Ingrown nail of great toe of right foot      Plan:  Patient was evaluated and treated and all questions answered. Patient requesting removal of ingrown nail today. Procedure below.  Discussed procedure and post procedure care and patient expressed understanding.  Will follow-up in 2 weeks for nail  check or sooner if any problems arise.    Procedure:  Procedure: partial Nail Avulsion of right hallux medial nail border.  Surgeon: Lorenda Peck, DPM  Pre-op Dx: Ingrown toenail without infection Post-op: Same  Place of Surgery: Office exam room.  Indications for surgery: Painful and ingrown toenail.  Findings: There is no redness, warmth, and swelling of tissues. There is incurvation of nail border. No purulence noted.    The patient is requesting removal of nail with chemical matrixectomy. Risks and complications were discussed with the patient for which they understand and written consent was obtained. Under sterile conditions a total of 3 mL of 1:1 mixture 0.5% marcaine plain and 1% lidocaine plain was infiltrated in a hallux block fashion. Once anesthetized, the skin was prepped in sterile fashion. A tourniquet was then applied. Next the medial aspect of hallux nail border was then sharply excised making sure to remove the entire offending nail border.  Next phenol was then applied under standard conditions and copiously irrigated. Silvadene was applied. A dry sterile dressing was applied. After application of the dressing the tourniquet was removed and there is found to be an immediate capillary refill time to the digit. The patient tolerated the procedure well without any complications. Post procedure instructions were discussed the patient for which he verbally understood. Follow-up in two weeks for nail check or sooner if any problems are to arise.  Discussed signs/symptoms of infection and directed to call the office immediately should any occur or go directly to the emergency room. In the meantime, encouraged to call the office with any questions, concerns, changes symptoms.   Lorenda Peck, DPM

## 2021-08-23 NOTE — Patient Instructions (Signed)

## 2021-08-28 ENCOUNTER — Ambulatory Visit (INDEPENDENT_AMBULATORY_CARE_PROVIDER_SITE_OTHER): Payer: Medicare Other | Admitting: Podiatry

## 2021-08-28 ENCOUNTER — Other Ambulatory Visit: Payer: Self-pay

## 2021-08-28 DIAGNOSIS — L600 Ingrowing nail: Secondary | ICD-10-CM

## 2021-08-28 NOTE — Progress Notes (Signed)
  Subjective:  Patient ID: [name], male    DOB: 1941-09-25,   MRN: [id number]  Chief Complaint  Patient presents with   Nail Problem    Medial border sore     80 y.o. male presents for follow-up of ingrown nail on right. States he is still sore on that border and believe he [name] more of the nail trimmed back as it is so thick Has been soaking and doing the neosporin and bandadi . Denies any other pedal complaints. Denies n/v/f/c.   Past Medical History:  Diagnosis Date   CKD (chronic kidney disease), stage III ([place])    Dyslipidemia    ED (erectile dysfunction)    History of Guillain-Barre syndrome    per pt 1990s fron Flu Vaccine,  stated resolved with no residual   History of nuclear stress test    08-23-2004  by dr Ron Parker--- normal without ischemia,  ef 68%   Hyperplasia of prostate with lower urinary tract symptoms (LUTS)    Hypertension    NASH (nonalcoholic steatohepatitis) 2010   followed by pcp   Prostate cancer [facility]) urologist-- dr winter;  oncologist-- dr Tammi Klippel   dx 02-26-2019,  Stage T1c,  Gleason 3+4, vol 80.1cc;  plan external radiation   Rhinorrhea    Type 2 diabetes mellitus ([place])     Objective:  Physical Exam: Vascular: DP/PT pulses 2/4 bilateral. CFT <3 seconds. Normal hair growth on digits. No edema.  Skin. No lacerations or abrasions bilateral feet. Medial border is well healing with distal nail thickening and hypekeraotis in the corner.  Musculoskeletal: MMT 5/5 bilateral lower extremities in DF, PF, Inversion and Eversion. Deceased ROM in DF of ankle joint.  Neurological: Sensation intact to light touch.   Assessment:   1. Ingrown nail of great toe of right foot      Plan:  Patient was evaluated and treated and all questions answered. Toe was evaluated and appears to be healing well. Medial distal nail border was trimmed back in slant back fashion to patient satisfaction.   May discontinue soaks and neosporin.  Patient to follow-up as needed.     Lorenda Peck, DPM

## 2021-09-11 ENCOUNTER — Ambulatory Visit: Payer: Medicare Other | Admitting: Podiatry

## 2021-09-12 DIAGNOSIS — L309 Dermatitis, unspecified: Secondary | ICD-10-CM | POA: Diagnosis not present

## 2021-09-12 DIAGNOSIS — L570 Actinic keratosis: Secondary | ICD-10-CM | POA: Diagnosis not present

## 2021-10-10 DIAGNOSIS — E1169 Type 2 diabetes mellitus with other specified complication: Secondary | ICD-10-CM | POA: Diagnosis not present

## 2021-10-10 DIAGNOSIS — E785 Hyperlipidemia, unspecified: Secondary | ICD-10-CM | POA: Diagnosis not present

## 2021-10-10 DIAGNOSIS — Z7984 Long term (current) use of oral hypoglycemic drugs: Secondary | ICD-10-CM | POA: Diagnosis not present

## 2021-10-10 DIAGNOSIS — Z0000 Encounter for general adult medical examination without abnormal findings: Secondary | ICD-10-CM | POA: Diagnosis not present

## 2021-10-10 DIAGNOSIS — I4891 Unspecified atrial fibrillation: Secondary | ICD-10-CM | POA: Diagnosis not present

## 2021-10-10 DIAGNOSIS — I10 Essential (primary) hypertension: Secondary | ICD-10-CM | POA: Diagnosis not present

## 2021-10-10 DIAGNOSIS — L309 Dermatitis, unspecified: Secondary | ICD-10-CM | POA: Diagnosis not present

## 2021-10-12 DIAGNOSIS — L309 Dermatitis, unspecified: Secondary | ICD-10-CM | POA: Diagnosis not present

## 2021-11-22 DIAGNOSIS — N289 Disorder of kidney and ureter, unspecified: Secondary | ICD-10-CM | POA: Diagnosis not present

## 2021-12-04 DIAGNOSIS — Z20822 Contact with and (suspected) exposure to covid-19: Secondary | ICD-10-CM | POA: Diagnosis not present

## 2021-12-05 DIAGNOSIS — Z20822 Contact with and (suspected) exposure to covid-19: Secondary | ICD-10-CM | POA: Diagnosis not present

## 2021-12-06 DIAGNOSIS — Z20822 Contact with and (suspected) exposure to covid-19: Secondary | ICD-10-CM | POA: Diagnosis not present

## 2022-02-23 ENCOUNTER — Other Ambulatory Visit: Payer: Self-pay | Admitting: Family Medicine

## 2022-02-26 NOTE — Telephone Encounter (Signed)
Look like he is no longer our patient. Recommend he follow up with current PCP for refills.  ?

## 2022-03-14 DIAGNOSIS — M545 Low back pain, unspecified: Secondary | ICD-10-CM | POA: Diagnosis not present

## 2022-03-16 DIAGNOSIS — R319 Hematuria, unspecified: Secondary | ICD-10-CM | POA: Diagnosis not present

## 2022-03-16 DIAGNOSIS — R310 Gross hematuria: Secondary | ICD-10-CM | POA: Diagnosis not present

## 2022-03-29 DIAGNOSIS — R310 Gross hematuria: Secondary | ICD-10-CM | POA: Diagnosis not present

## 2022-03-29 DIAGNOSIS — N3289 Other specified disorders of bladder: Secondary | ICD-10-CM | POA: Diagnosis not present

## 2022-03-29 DIAGNOSIS — K760 Fatty (change of) liver, not elsewhere classified: Secondary | ICD-10-CM | POA: Diagnosis not present

## 2022-03-29 DIAGNOSIS — K5730 Diverticulosis of large intestine without perforation or abscess without bleeding: Secondary | ICD-10-CM | POA: Diagnosis not present

## 2022-03-29 DIAGNOSIS — K8020 Calculus of gallbladder without cholecystitis without obstruction: Secondary | ICD-10-CM | POA: Diagnosis not present

## 2022-04-12 DIAGNOSIS — I872 Venous insufficiency (chronic) (peripheral): Secondary | ICD-10-CM | POA: Diagnosis not present

## 2022-04-12 DIAGNOSIS — L821 Other seborrheic keratosis: Secondary | ICD-10-CM | POA: Diagnosis not present

## 2022-04-12 DIAGNOSIS — D225 Melanocytic nevi of trunk: Secondary | ICD-10-CM | POA: Diagnosis not present

## 2022-04-12 DIAGNOSIS — C44622 Squamous cell carcinoma of skin of right upper limb, including shoulder: Secondary | ICD-10-CM | POA: Diagnosis not present

## 2022-04-12 DIAGNOSIS — L814 Other melanin hyperpigmentation: Secondary | ICD-10-CM | POA: Diagnosis not present

## 2022-04-12 DIAGNOSIS — D492 Neoplasm of unspecified behavior of bone, soft tissue, and skin: Secondary | ICD-10-CM | POA: Diagnosis not present

## 2022-04-12 DIAGNOSIS — L570 Actinic keratosis: Secondary | ICD-10-CM | POA: Diagnosis not present

## 2022-04-25 DIAGNOSIS — M545 Low back pain, unspecified: Secondary | ICD-10-CM | POA: Diagnosis not present

## 2022-05-04 DIAGNOSIS — R310 Gross hematuria: Secondary | ICD-10-CM | POA: Diagnosis not present

## 2022-05-31 DIAGNOSIS — L578 Other skin changes due to chronic exposure to nonionizing radiation: Secondary | ICD-10-CM | POA: Diagnosis not present

## 2022-05-31 DIAGNOSIS — L570 Actinic keratosis: Secondary | ICD-10-CM | POA: Diagnosis not present

## 2022-05-31 DIAGNOSIS — D0461 Carcinoma in situ of skin of right upper limb, including shoulder: Secondary | ICD-10-CM | POA: Diagnosis not present

## 2022-07-10 DIAGNOSIS — L218 Other seborrheic dermatitis: Secondary | ICD-10-CM | POA: Diagnosis not present

## 2022-07-10 DIAGNOSIS — L578 Other skin changes due to chronic exposure to nonionizing radiation: Secondary | ICD-10-CM | POA: Diagnosis not present

## 2022-07-10 DIAGNOSIS — L570 Actinic keratosis: Secondary | ICD-10-CM | POA: Diagnosis not present

## 2022-09-12 ENCOUNTER — Telehealth: Payer: Self-pay | Admitting: *Deleted

## 2022-09-12 NOTE — Patient Outreach (Signed)
  Care Coordination   09/12/2022 Name: [name] MRN: [id number] DOB: 01/01/1941   Care Coordination Outreach Attempts:  An unsuccessful telephone outreach was attempted today to offer the patient information about available care coordination services as a benefit of their health plan.   Follow Up Plan:  Additional outreach attempts will be made to offer the patient care coordination information and services.   Encounter Outcome:  No Answer  Care Coordination Interventions Activated:  No   Care Coordination Interventions:  No, not indicated    Raina Mina, RN Care Management Coordinator [place] Office [phone]

## 2022-09-21 DIAGNOSIS — E785 Hyperlipidemia, unspecified: Secondary | ICD-10-CM | POA: Diagnosis not present

## 2022-09-21 DIAGNOSIS — N183 Chronic kidney disease, stage 3 unspecified: Secondary | ICD-10-CM | POA: Diagnosis not present

## 2022-09-21 DIAGNOSIS — I10 Essential (primary) hypertension: Secondary | ICD-10-CM | POA: Diagnosis not present

## 2022-09-21 DIAGNOSIS — I4891 Unspecified atrial fibrillation: Secondary | ICD-10-CM | POA: Diagnosis not present

## 2022-09-21 DIAGNOSIS — E1169 Type 2 diabetes mellitus with other specified complication: Secondary | ICD-10-CM | POA: Diagnosis not present

## 2022-10-09 DIAGNOSIS — L814 Other melanin hyperpigmentation: Secondary | ICD-10-CM | POA: Diagnosis not present

## 2022-10-09 DIAGNOSIS — L578 Other skin changes due to chronic exposure to nonionizing radiation: Secondary | ICD-10-CM | POA: Diagnosis not present

## 2022-10-09 DIAGNOSIS — D225 Melanocytic nevi of trunk: Secondary | ICD-10-CM | POA: Diagnosis not present

## 2022-10-09 DIAGNOSIS — Z08 Encounter for follow-up examination after completed treatment for malignant neoplasm: Secondary | ICD-10-CM | POA: Diagnosis not present

## 2022-10-09 DIAGNOSIS — L308 Other specified dermatitis: Secondary | ICD-10-CM | POA: Diagnosis not present

## 2022-10-09 DIAGNOSIS — I872 Venous insufficiency (chronic) (peripheral): Secondary | ICD-10-CM | POA: Diagnosis not present

## 2022-10-09 DIAGNOSIS — L570 Actinic keratosis: Secondary | ICD-10-CM | POA: Diagnosis not present

## 2022-10-09 DIAGNOSIS — Z85828 Personal history of other malignant neoplasm of skin: Secondary | ICD-10-CM | POA: Diagnosis not present

## 2022-10-09 DIAGNOSIS — L821 Other seborrheic keratosis: Secondary | ICD-10-CM | POA: Diagnosis not present

## 2022-10-15 DIAGNOSIS — Z0000 Encounter for general adult medical examination without abnormal findings: Secondary | ICD-10-CM | POA: Diagnosis not present

## 2022-10-15 DIAGNOSIS — I10 Essential (primary) hypertension: Secondary | ICD-10-CM | POA: Diagnosis not present

## 2022-10-15 DIAGNOSIS — I700 Atherosclerosis of aorta: Secondary | ICD-10-CM | POA: Diagnosis not present

## 2022-10-15 DIAGNOSIS — J3489 Other specified disorders of nose and nasal sinuses: Secondary | ICD-10-CM | POA: Diagnosis not present

## 2022-10-15 DIAGNOSIS — E1169 Type 2 diabetes mellitus with other specified complication: Secondary | ICD-10-CM | POA: Diagnosis not present

## 2022-10-15 DIAGNOSIS — I4891 Unspecified atrial fibrillation: Secondary | ICD-10-CM | POA: Diagnosis not present

## 2022-10-15 DIAGNOSIS — N183 Chronic kidney disease, stage 3 unspecified: Secondary | ICD-10-CM | POA: Diagnosis not present

## 2022-10-15 DIAGNOSIS — E785 Hyperlipidemia, unspecified: Secondary | ICD-10-CM | POA: Diagnosis not present

## 2022-10-20 DIAGNOSIS — J40 Bronchitis, not specified as acute or chronic: Secondary | ICD-10-CM | POA: Diagnosis not present

## 2022-10-24 ENCOUNTER — Telehealth: Payer: Self-pay | Admitting: *Deleted

## 2022-10-24 NOTE — Patient Outreach (Signed)
  Care Coordination   10/24/2022 Name: Daulton Harbaugh MRN: [id number] DOB: Oct 22, 1941   Care Coordination Outreach Attempts:  A second unsuccessful outreach was attempted today to offer the patient with information about available care coordination services as a benefit of their health plan.     Follow Up Plan:  Additional outreach attempts will be made to offer the patient care coordination information and services.   Encounter Outcome:  No Answer   Care Coordination Interventions:  No, not indicated    Raina Mina, RN Care Management Coordinator [place] Office [phone]

## 2022-12-17 DIAGNOSIS — L570 Actinic keratosis: Secondary | ICD-10-CM | POA: Diagnosis not present

## 2023-02-01 ENCOUNTER — Emergency Department (HOSPITAL_COMMUNITY): Payer: Medicare Other

## 2023-02-01 ENCOUNTER — Emergency Department (HOSPITAL_COMMUNITY)
Admission: EM | Admit: 2023-02-01 | Discharge: 2023-02-01 | Disposition: A | Discharge status: Home / Self Care | Payer: Medicare Other | Attending: Emergency Medicine | Admitting: Emergency Medicine

## 2023-02-01 ENCOUNTER — Other Ambulatory Visit: Payer: Self-pay

## 2023-02-01 DIAGNOSIS — Z743 Need for continuous supervision: Secondary | ICD-10-CM | POA: Diagnosis not present

## 2023-02-01 DIAGNOSIS — Z79899 Other long term (current) drug therapy: Secondary | ICD-10-CM | POA: Insufficient documentation

## 2023-02-01 DIAGNOSIS — S51812A Laceration without foreign body of left forearm, initial encounter: Secondary | ICD-10-CM | POA: Diagnosis not present

## 2023-02-01 DIAGNOSIS — N189 Chronic kidney disease, unspecified: Secondary | ICD-10-CM | POA: Diagnosis not present

## 2023-02-01 DIAGNOSIS — S20211A Contusion of right front wall of thorax, initial encounter: Secondary | ICD-10-CM | POA: Diagnosis not present

## 2023-02-01 DIAGNOSIS — Z041 Encounter for examination and observation following transport accident: Secondary | ICD-10-CM | POA: Diagnosis not present

## 2023-02-01 DIAGNOSIS — I129 Hypertensive chronic kidney disease with stage 1 through stage 4 chronic kidney disease, or unspecified chronic kidney disease: Secondary | ICD-10-CM | POA: Insufficient documentation

## 2023-02-01 DIAGNOSIS — S2231XA Fracture of one rib, right side, initial encounter for closed fracture: Secondary | ICD-10-CM | POA: Diagnosis not present

## 2023-02-01 DIAGNOSIS — E1122 Type 2 diabetes mellitus with diabetic chronic kidney disease: Secondary | ICD-10-CM | POA: Diagnosis not present

## 2023-02-01 DIAGNOSIS — Y92410 Unspecified street and highway as the place of occurrence of the external cause: Secondary | ICD-10-CM | POA: Diagnosis not present

## 2023-02-01 DIAGNOSIS — R519 Headache, unspecified: Secondary | ICD-10-CM | POA: Diagnosis not present

## 2023-02-01 DIAGNOSIS — K8020 Calculus of gallbladder without cholecystitis without obstruction: Secondary | ICD-10-CM | POA: Diagnosis not present

## 2023-02-01 DIAGNOSIS — Z7984 Long term (current) use of oral hypoglycemic drugs: Secondary | ICD-10-CM | POA: Insufficient documentation

## 2023-02-01 DIAGNOSIS — K5730 Diverticulosis of large intestine without perforation or abscess without bleeding: Secondary | ICD-10-CM | POA: Diagnosis not present

## 2023-02-01 DIAGNOSIS — Z8546 Personal history of malignant neoplasm of prostate: Secondary | ICD-10-CM | POA: Diagnosis not present

## 2023-02-01 DIAGNOSIS — R079 Chest pain, unspecified: Secondary | ICD-10-CM | POA: Diagnosis not present

## 2023-02-01 DIAGNOSIS — Z7901 Long term (current) use of anticoagulants: Secondary | ICD-10-CM | POA: Diagnosis not present

## 2023-02-01 DIAGNOSIS — R609 Edema, unspecified: Secondary | ICD-10-CM | POA: Diagnosis not present

## 2023-02-01 DIAGNOSIS — S0990XA Unspecified injury of head, initial encounter: Secondary | ICD-10-CM | POA: Diagnosis not present

## 2023-02-01 DIAGNOSIS — S59912A Unspecified injury of left forearm, initial encounter: Secondary | ICD-10-CM | POA: Diagnosis present

## 2023-02-01 LAB — CBC
HCT: 43.5 % (ref 39.0–52.0)
Hemoglobin: 14.8 g/dL (ref 13.0–17.0)
MCH: 32.5 pg (ref 26.0–34.0)
MCHC: 34 g/dL (ref 30.0–36.0)
MCV: 95.4 fL (ref 80.0–100.0)
Platelets: 190 10*3/uL (ref 150–400)
RBC: 4.56 MIL/uL (ref 4.22–5.81)
RDW: 13.7 % (ref 11.5–15.5)
WBC: 10 10*3/uL (ref 4.0–10.5)
nRBC: 0 % (ref 0.0–0.2)

## 2023-02-01 LAB — COMPREHENSIVE METABOLIC PANEL
ALT: 27 U/L (ref 0–44)
AST: 27 U/L (ref 15–41)
Albumin: 3.7 g/dL (ref 3.5–5.0)
Alkaline Phosphatase: 120 U/L (ref 38–126)
Anion gap: 8 (ref 5–15)
BUN: 30 mg/dL — ABNORMAL HIGH (ref 8–23)
CO2: 23 mmol/L (ref 22–32)
Calcium: 8.6 mg/dL — ABNORMAL LOW (ref 8.9–10.3)
Chloride: 103 mmol/L (ref 98–111)
Creatinine, Ser: 1.69 mg/dL — ABNORMAL HIGH (ref 0.61–1.24)
GFR, Estimated: 41 mL/min — ABNORMAL LOW (ref >60)
Glucose, Bld: 249 mg/dL — ABNORMAL HIGH (ref 70–99)
Potassium: 3.9 mmol/L (ref 3.5–5.1)
Sodium: 134 mmol/L — ABNORMAL LOW (ref 135–145)
Total Bilirubin: 0.7 mg/dL (ref 0.3–1.2)
Total Protein: 6.5 g/dL (ref 6.5–8.1)

## 2023-02-01 LAB — I-STAT CHEM 8, ED
BUN: 29 mg/dL — ABNORMAL HIGH (ref 8–23)
Calcium, Ion: 1.19 mmol/L (ref 1.15–1.40)
Chloride: 103 mmol/L (ref 98–111)
Creatinine, Ser: 1.7 mg/dL — ABNORMAL HIGH (ref 0.61–1.24)
Glucose, Bld: 243 mg/dL — ABNORMAL HIGH (ref 70–99)
HCT: 44 % (ref 39.0–52.0)
Hemoglobin: 15 g/dL (ref 13.0–17.0)
Potassium: 3.9 mmol/L (ref 3.5–5.1)
Sodium: 139 mmol/L (ref 135–145)
TCO2: 24 mmol/L (ref 22–32)

## 2023-02-01 LAB — LACTIC ACID, PLASMA: Lactic Acid, Venous: 2.1 mmol/L (ref 0.5–1.9)

## 2023-02-01 LAB — ETHANOL: Alcohol, Ethyl (B): <10 mg/dL (ref <10)

## 2023-02-01 LAB — PROTIME-INR
INR: 1.1 (ref 0.8–1.2)
Prothrombin Time: 13.7 seconds (ref 11.4–15.2)

## 2023-02-01 MED ORDER — FENTANYL CITRATE PF 50 MCG/ML IJ SOSY
25.0000 ug | PREFILLED_SYRINGE | Freq: Once | INTRAMUSCULAR | Status: AC
  Administered 2023-02-01: 25 ug via INTRAVENOUS
  Filled 2023-02-01: qty 1

## 2023-02-01 MED ORDER — LIDOCAINE HCL (PF) 1 % IJ SOLN
5.0000 mL | Freq: Once | INTRAMUSCULAR | Status: AC
  Administered 2023-02-01: 5 mL
  Filled 2023-02-01: qty 5

## 2023-02-01 MED ORDER — HYDROCODONE-ACETAMINOPHEN 5-325 MG PO TABS: 1.0000 | ORAL_TABLET | Freq: Four times a day (QID) | ORAL | 0 refills | Status: AC | PRN

## 2023-02-01 MED ORDER — SODIUM CHLORIDE 0.9 % IV SOLN
250.0000 mL | INTRAVENOUS | Status: DC | PRN
  Administered 2023-02-01: 250 mL via INTRAVENOUS

## 2023-02-01 MED ORDER — SODIUM CHLORIDE 0.9% FLUSH: 3.0000 mL | INTRAVENOUS | Status: DC | PRN

## 2023-02-01 MED ORDER — ONDANSETRON HCL 4 MG/2ML IJ SOLN
4.0000 mg | Freq: Once | INTRAMUSCULAR | Status: AC
  Administered 2023-02-01: 4 mg via INTRAVENOUS
  Filled 2023-02-01: qty 2

## 2023-02-01 MED ORDER — SODIUM CHLORIDE 0.9 % IV BOLUS
1000.0000 mL | Freq: Once | INTRAVENOUS | Status: AC
  Administered 2023-02-01: 1000 mL via INTRAVENOUS

## 2023-02-01 MED ORDER — IOHEXOL 300 MG/ML  SOLN
80.0000 mL | Freq: Once | INTRAMUSCULAR | Status: AC | PRN
  Administered 2023-02-01: 80 mL via INTRAVENOUS

## 2023-02-01 MED ORDER — HYDROCODONE-ACETAMINOPHEN 5-325 MG PO TABS: 1.0000 | ORAL_TABLET | Freq: Four times a day (QID) | ORAL | 0 refills | Status: DC | PRN

## 2023-02-01 MED ORDER — SODIUM CHLORIDE 0.9% FLUSH
3.0000 mL | Freq: Two times a day (BID) | INTRAVENOUS | Status: DC
  Administered 2023-02-01: 3 mL via INTRAVENOUS

## 2023-02-01 NOTE — Discharge Instructions (Signed)
Do not take your blood thinner until Monday.  Take Tylenol for pain or if that does not work you can take the hydrocodone.  Follow-up with your doctor next week for recheck

## 2023-02-01 NOTE — ED Notes (Signed)
Patient transported to CT 

## 2023-02-01 NOTE — ED Notes (Signed)
Lactic Acid of 2.1 reported to Zackowski,MD
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
## 2023-02-01 NOTE — ED Notes (Signed)
Lidocaine at bedside for provider use.

## 2023-02-01 NOTE — ED Notes (Signed)
Patient unable to lay flat for CT scan.
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
## 2023-02-08 DIAGNOSIS — E1122 Type 2 diabetes mellitus with diabetic chronic kidney disease: Secondary | ICD-10-CM | POA: Diagnosis not present

## 2023-02-08 DIAGNOSIS — I4891 Unspecified atrial fibrillation: Secondary | ICD-10-CM | POA: Diagnosis not present

## 2023-02-08 DIAGNOSIS — N183 Chronic kidney disease, stage 3 unspecified: Secondary | ICD-10-CM | POA: Diagnosis not present

## 2023-02-08 DIAGNOSIS — I700 Atherosclerosis of aorta: Secondary | ICD-10-CM | POA: Diagnosis not present

## 2023-02-12 DIAGNOSIS — E1122 Type 2 diabetes mellitus with diabetic chronic kidney disease: Secondary | ICD-10-CM | POA: Diagnosis not present

## 2023-02-12 DIAGNOSIS — Z4802 Encounter for removal of sutures: Secondary | ICD-10-CM | POA: Diagnosis not present

## 2023-02-12 DIAGNOSIS — I10 Essential (primary) hypertension: Secondary | ICD-10-CM | POA: Diagnosis not present

## 2023-02-12 DIAGNOSIS — I4891 Unspecified atrial fibrillation: Secondary | ICD-10-CM | POA: Diagnosis not present

## 2023-03-26 DIAGNOSIS — H40013 Open angle with borderline findings, low risk, bilateral: Secondary | ICD-10-CM | POA: Diagnosis not present

## 2023-03-26 DIAGNOSIS — H25813 Combined forms of age-related cataract, bilateral: Secondary | ICD-10-CM | POA: Diagnosis not present

## 2023-03-26 DIAGNOSIS — H35371 Puckering of macula, right eye: Secondary | ICD-10-CM | POA: Diagnosis not present

## 2023-03-26 DIAGNOSIS — E119 Type 2 diabetes mellitus without complications: Secondary | ICD-10-CM | POA: Diagnosis not present

## 2023-04-04 DIAGNOSIS — L570 Actinic keratosis: Secondary | ICD-10-CM | POA: Diagnosis not present

## 2023-04-04 DIAGNOSIS — Z08 Encounter for follow-up examination after completed treatment for malignant neoplasm: Secondary | ICD-10-CM | POA: Diagnosis not present

## 2023-04-04 DIAGNOSIS — L218 Other seborrheic dermatitis: Secondary | ICD-10-CM | POA: Diagnosis not present

## 2023-04-04 DIAGNOSIS — Z85828 Personal history of other malignant neoplasm of skin: Secondary | ICD-10-CM | POA: Diagnosis not present

## 2023-04-29 DIAGNOSIS — R310 Gross hematuria: Secondary | ICD-10-CM | POA: Diagnosis not present

## 2023-05-08 DIAGNOSIS — L718 Other rosacea: Secondary | ICD-10-CM | POA: Diagnosis not present

## 2023-05-08 DIAGNOSIS — T490X5A Adverse effect of local antifungal, anti-infective and anti-inflammatory drugs, initial encounter: Secondary | ICD-10-CM | POA: Diagnosis not present

## 2023-05-24 DIAGNOSIS — H25812 Combined forms of age-related cataract, left eye: Secondary | ICD-10-CM | POA: Diagnosis not present

## 2023-05-24 DIAGNOSIS — H524 Presbyopia: Secondary | ICD-10-CM | POA: Diagnosis not present

## 2023-05-24 DIAGNOSIS — H25813 Combined forms of age-related cataract, bilateral: Secondary | ICD-10-CM | POA: Diagnosis not present

## 2023-06-19 DIAGNOSIS — T490X5A Adverse effect of local antifungal, anti-infective and anti-inflammatory drugs, initial encounter: Secondary | ICD-10-CM | POA: Diagnosis not present

## 2023-06-19 DIAGNOSIS — L718 Other rosacea: Secondary | ICD-10-CM | POA: Diagnosis not present

## 2023-08-05 DIAGNOSIS — U071 COVID-19: Secondary | ICD-10-CM | POA: Diagnosis not present

## 2023-08-05 DIAGNOSIS — R051 Acute cough: Secondary | ICD-10-CM | POA: Diagnosis not present

## 2023-10-18 DIAGNOSIS — H268 Other specified cataract: Secondary | ICD-10-CM | POA: Diagnosis not present

## 2023-10-18 DIAGNOSIS — H25813 Combined forms of age-related cataract, bilateral: Secondary | ICD-10-CM | POA: Diagnosis not present

## 2023-10-21 DIAGNOSIS — E1122 Type 2 diabetes mellitus with diabetic chronic kidney disease: Secondary | ICD-10-CM | POA: Diagnosis not present

## 2023-10-21 DIAGNOSIS — N183 Chronic kidney disease, stage 3 unspecified: Secondary | ICD-10-CM | POA: Diagnosis not present

## 2023-10-21 DIAGNOSIS — E785 Hyperlipidemia, unspecified: Secondary | ICD-10-CM | POA: Diagnosis not present

## 2023-10-21 DIAGNOSIS — I4891 Unspecified atrial fibrillation: Secondary | ICD-10-CM | POA: Diagnosis not present

## 2023-10-21 DIAGNOSIS — Z0000 Encounter for general adult medical examination without abnormal findings: Secondary | ICD-10-CM | POA: Diagnosis not present

## 2023-10-21 DIAGNOSIS — D6869 Other thrombophilia: Secondary | ICD-10-CM | POA: Diagnosis not present

## 2023-10-21 DIAGNOSIS — I10 Essential (primary) hypertension: Secondary | ICD-10-CM | POA: Diagnosis not present

## 2023-10-22 DIAGNOSIS — D225 Melanocytic nevi of trunk: Secondary | ICD-10-CM | POA: Diagnosis not present

## 2023-10-22 DIAGNOSIS — L858 Other specified epidermal thickening: Secondary | ICD-10-CM | POA: Diagnosis not present

## 2023-10-22 DIAGNOSIS — L814 Other melanin hyperpigmentation: Secondary | ICD-10-CM | POA: Diagnosis not present

## 2023-10-22 DIAGNOSIS — L821 Other seborrheic keratosis: Secondary | ICD-10-CM | POA: Diagnosis not present

## 2023-10-22 DIAGNOSIS — L570 Actinic keratosis: Secondary | ICD-10-CM | POA: Diagnosis not present

## 2023-10-22 DIAGNOSIS — D492 Neoplasm of unspecified behavior of bone, soft tissue, and skin: Secondary | ICD-10-CM | POA: Diagnosis not present

## 2023-11-13 DIAGNOSIS — S2242XA Multiple fractures of ribs, left side, initial encounter for closed fracture: Secondary | ICD-10-CM | POA: Diagnosis not present

## 2023-11-13 DIAGNOSIS — S32019A Unspecified fracture of first lumbar vertebra, initial encounter for closed fracture: Secondary | ICD-10-CM | POA: Diagnosis not present

## 2023-11-13 DIAGNOSIS — S299XXA Unspecified injury of thorax, initial encounter: Secondary | ICD-10-CM | POA: Diagnosis not present

## 2023-11-13 DIAGNOSIS — M546 Pain in thoracic spine: Secondary | ICD-10-CM | POA: Diagnosis not present

## 2023-11-13 DIAGNOSIS — W19XXXA Unspecified fall, initial encounter: Secondary | ICD-10-CM | POA: Diagnosis not present

## 2023-11-13 DIAGNOSIS — M47814 Spondylosis without myelopathy or radiculopathy, thoracic region: Secondary | ICD-10-CM | POA: Diagnosis not present

## 2023-11-21 DIAGNOSIS — N289 Disorder of kidney and ureter, unspecified: Secondary | ICD-10-CM | POA: Diagnosis not present

## 2023-12-05 DIAGNOSIS — N183 Chronic kidney disease, stage 3 unspecified: Secondary | ICD-10-CM | POA: Diagnosis not present

## 2023-12-05 DIAGNOSIS — S2239XA Fracture of one rib, unspecified side, initial encounter for closed fracture: Secondary | ICD-10-CM | POA: Diagnosis not present

## 2023-12-05 DIAGNOSIS — N289 Disorder of kidney and ureter, unspecified: Secondary | ICD-10-CM | POA: Diagnosis not present

## 2023-12-05 DIAGNOSIS — E1169 Type 2 diabetes mellitus with other specified complication: Secondary | ICD-10-CM | POA: Diagnosis not present

## 2023-12-11 DIAGNOSIS — S2242XD Multiple fractures of ribs, left side, subsequent encounter for fracture with routine healing: Secondary | ICD-10-CM | POA: Diagnosis not present

## 2023-12-26 DIAGNOSIS — R059 Cough, unspecified: Secondary | ICD-10-CM | POA: Diagnosis not present

## 2023-12-26 DIAGNOSIS — R52 Pain, unspecified: Secondary | ICD-10-CM | POA: Diagnosis not present

## 2023-12-26 DIAGNOSIS — R509 Fever, unspecified: Secondary | ICD-10-CM | POA: Diagnosis not present

## 2024-01-07 DIAGNOSIS — N289 Disorder of kidney and ureter, unspecified: Secondary | ICD-10-CM | POA: Diagnosis not present

## 2024-03-16 DIAGNOSIS — N289 Disorder of kidney and ureter, unspecified: Secondary | ICD-10-CM | POA: Diagnosis not present

## 2024-04-21 DIAGNOSIS — L814 Other melanin hyperpigmentation: Secondary | ICD-10-CM | POA: Diagnosis not present

## 2024-04-21 DIAGNOSIS — L821 Other seborrheic keratosis: Secondary | ICD-10-CM | POA: Diagnosis not present

## 2024-04-21 DIAGNOSIS — D225 Melanocytic nevi of trunk: Secondary | ICD-10-CM | POA: Diagnosis not present

## 2024-04-21 DIAGNOSIS — L570 Actinic keratosis: Secondary | ICD-10-CM | POA: Diagnosis not present

## 2024-04-21 DIAGNOSIS — Z85828 Personal history of other malignant neoplasm of skin: Secondary | ICD-10-CM | POA: Diagnosis not present

## 2024-04-21 DIAGNOSIS — X32XXXA Exposure to sunlight, initial encounter: Secondary | ICD-10-CM | POA: Diagnosis not present

## 2024-04-21 DIAGNOSIS — L218 Other seborrheic dermatitis: Secondary | ICD-10-CM | POA: Diagnosis not present

## 2024-04-21 DIAGNOSIS — D0439 Carcinoma in situ of skin of other parts of face: Secondary | ICD-10-CM | POA: Diagnosis not present

## 2024-04-21 DIAGNOSIS — Z08 Encounter for follow-up examination after completed treatment for malignant neoplasm: Secondary | ICD-10-CM | POA: Diagnosis not present

## 2024-04-21 DIAGNOSIS — D492 Neoplasm of unspecified behavior of bone, soft tissue, and skin: Secondary | ICD-10-CM | POA: Diagnosis not present

## 2024-06-08 DIAGNOSIS — D0439 Carcinoma in situ of skin of other parts of face: Secondary | ICD-10-CM | POA: Diagnosis not present

## 2024-06-25 DIAGNOSIS — E119 Type 2 diabetes mellitus without complications: Secondary | ICD-10-CM | POA: Diagnosis not present

## 2024-06-25 DIAGNOSIS — H40013 Open angle with borderline findings, low risk, bilateral: Secondary | ICD-10-CM | POA: Diagnosis not present

## 2024-06-25 DIAGNOSIS — H25811 Combined forms of age-related cataract, right eye: Secondary | ICD-10-CM | POA: Diagnosis not present

## 2024-06-25 DIAGNOSIS — H35371 Puckering of macula, right eye: Secondary | ICD-10-CM | POA: Diagnosis not present

## 2024-07-03 DIAGNOSIS — D0439 Carcinoma in situ of skin of other parts of face: Secondary | ICD-10-CM | POA: Diagnosis not present

## 2024-07-26 DIAGNOSIS — R21 Rash and other nonspecific skin eruption: Secondary | ICD-10-CM | POA: Diagnosis not present

## 2024-08-08 DIAGNOSIS — J40 Bronchitis, not specified as acute or chronic: Secondary | ICD-10-CM | POA: Diagnosis not present

## 2024-08-08 DIAGNOSIS — R509 Fever, unspecified: Secondary | ICD-10-CM | POA: Diagnosis not present

## 2024-08-12 DIAGNOSIS — R051 Acute cough: Secondary | ICD-10-CM | POA: Diagnosis not present

## 2024-08-18 DIAGNOSIS — Z08 Encounter for follow-up examination after completed treatment for malignant neoplasm: Secondary | ICD-10-CM | POA: Diagnosis not present

## 2024-08-18 DIAGNOSIS — D492 Neoplasm of unspecified behavior of bone, soft tissue, and skin: Secondary | ICD-10-CM | POA: Diagnosis not present

## 2024-08-18 DIAGNOSIS — L821 Other seborrheic keratosis: Secondary | ICD-10-CM | POA: Diagnosis not present

## 2024-08-18 DIAGNOSIS — L570 Actinic keratosis: Secondary | ICD-10-CM | POA: Diagnosis not present

## 2024-08-18 DIAGNOSIS — Z85828 Personal history of other malignant neoplasm of skin: Secondary | ICD-10-CM | POA: Diagnosis not present

## 2024-08-18 DIAGNOSIS — L814 Other melanin hyperpigmentation: Secondary | ICD-10-CM | POA: Diagnosis not present

## 2024-08-18 DIAGNOSIS — D225 Melanocytic nevi of trunk: Secondary | ICD-10-CM | POA: Diagnosis not present

## 2024-08-18 DIAGNOSIS — D1801 Hemangioma of skin and subcutaneous tissue: Secondary | ICD-10-CM | POA: Diagnosis not present

## 2024-09-03 DIAGNOSIS — C44329 Squamous cell carcinoma of skin of other parts of face: Secondary | ICD-10-CM | POA: Diagnosis not present

## 2024-09-14 NOTE — Progress Notes (Addendum)
 [name]                                          MRN: [id number]   10/13/2024   The [facility] Quality Team Specialist reviewed this patient medical record for the purposes of chart review for care gap closure. The following were reviewed: chart review for care gap closure-kidney health evaluation for diabetes:eGFR  and uACR.    [facility] Quality Team

## 2024-10-28 ENCOUNTER — Other Ambulatory Visit (HOSPITAL_COMMUNITY): Payer: Self-pay | Admitting: Student

## 2024-10-28 ENCOUNTER — Ambulatory Visit (HOSPITAL_COMMUNITY)
Admission: RE | Admit: 2024-10-28 | Discharge: 2024-10-28 | Disposition: A | Discharge status: Home / Self Care | Source: Ambulatory Visit | Attending: Vascular Surgery | Admitting: Vascular Surgery

## 2024-10-28 DIAGNOSIS — R609 Edema, unspecified: Secondary | ICD-10-CM

## 2024-11-10 ENCOUNTER — Other Ambulatory Visit (HOSPITAL_COMMUNITY): Payer: Self-pay

## 2024-11-10 MED ORDER — APIXABAN 2.5 MG PO TABS
2.5000 mg | ORAL_TABLET | Freq: Two times a day (BID) | ORAL | 0 refills | Status: AC
  Filled 2024-11-10: qty 180, 90d supply, fill #0

## 2024-11-10 MED ORDER — ATORVASTATIN CALCIUM 80 MG PO TABS
80.0000 mg | ORAL_TABLET | Freq: Every day | ORAL | 3 refills | Status: AC
  Filled 2024-11-10: qty 100, 100d supply, fill #0

## 2024-11-10 MED ORDER — TAMSULOSIN HCL 0.4 MG PO CAPS
0.4000 mg | ORAL_CAPSULE | Freq: Two times a day (BID) | ORAL | 3 refills | Status: AC
  Filled 2024-11-10: qty 200, 100d supply, fill #0

## 2024-11-10 MED ORDER — METFORMIN HCL ER 500 MG PO TB24
500.0000 mg | ORAL_TABLET | Freq: Every evening | ORAL | 0 refills | Status: AC
  Filled 2024-11-10: qty 100, 100d supply, fill #0

## 2024-11-10 MED ORDER — LOSARTAN POTASSIUM-HCTZ 100-25 MG PO TABS
1.0000 | ORAL_TABLET | Freq: Every day | ORAL | 2 refills | Status: AC
  Filled 2024-11-10: qty 100, 100d supply, fill #0

## 2024-11-11 ENCOUNTER — Other Ambulatory Visit (HOSPITAL_COMMUNITY): Payer: Self-pay

## 2024-11-11 ENCOUNTER — Encounter: Payer: Self-pay | Admitting: Pharmacist

## 2024-11-11 ENCOUNTER — Other Ambulatory Visit: Payer: Self-pay

## 2024-11-12 ENCOUNTER — Other Ambulatory Visit: Payer: Self-pay
